# Patient Record
Sex: Male | Born: 1995
Health system: Southern US, Community
[De-identification: ages and names within clinical notes are randomized; demographics above are authoritative.]

## PROBLEM LIST (undated history)

## (undated) ENCOUNTER — Emergency Department (HOSPITAL_BASED_OUTPATIENT_CLINIC_OR_DEPARTMENT_OTHER): Admission: EM | Payer: BLUE CROSS/BLUE SHIELD

## (undated) DIAGNOSIS — I10 Essential (primary) hypertension: Secondary | ICD-10-CM

## (undated) DIAGNOSIS — K3184 Gastroparesis: Secondary | ICD-10-CM

## (undated) DIAGNOSIS — E119 Type 2 diabetes mellitus without complications: Secondary | ICD-10-CM

## (undated) DIAGNOSIS — E11319 Type 2 diabetes mellitus with unspecified diabetic retinopathy without macular edema: Secondary | ICD-10-CM

## (undated) HISTORY — PX: ESOPHAGOGASTRODUODENOSCOPY: SHX1529

## (undated) HISTORY — PX: EYE SURGERY: SHX253

---
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
## 2018-01-29 ENCOUNTER — Other Ambulatory Visit: Payer: Self-pay

## 2018-01-29 ENCOUNTER — Encounter (HOSPITAL_BASED_OUTPATIENT_CLINIC_OR_DEPARTMENT_OTHER): Payer: Self-pay | Admitting: Emergency Medicine

## 2018-01-29 ENCOUNTER — Emergency Department (HOSPITAL_BASED_OUTPATIENT_CLINIC_OR_DEPARTMENT_OTHER)
Admission: EM | Admit: 2018-01-29 | Discharge: 2018-01-29 | Disposition: A | Discharge status: Home / Self Care | Payer: BLUE CROSS/BLUE SHIELD | Attending: Emergency Medicine | Admitting: Emergency Medicine

## 2018-01-29 DIAGNOSIS — Z794 Long term (current) use of insulin: Secondary | ICD-10-CM | POA: Diagnosis not present

## 2018-01-29 DIAGNOSIS — E119 Type 2 diabetes mellitus without complications: Secondary | ICD-10-CM | POA: Diagnosis not present

## 2018-01-29 DIAGNOSIS — J029 Acute pharyngitis, unspecified: Secondary | ICD-10-CM | POA: Diagnosis present

## 2018-01-29 DIAGNOSIS — F17200 Nicotine dependence, unspecified, uncomplicated: Secondary | ICD-10-CM | POA: Diagnosis not present

## 2018-01-29 DIAGNOSIS — Z79899 Other long term (current) drug therapy: Secondary | ICD-10-CM | POA: Insufficient documentation

## 2018-01-29 HISTORY — DX: Type 2 diabetes mellitus without complications: E11.9

## 2018-01-29 LAB — RAPID STREP SCREEN (MED CTR MEBANE ONLY): Streptococcus, Group A Screen (Direct): NEGATIVE

## 2018-01-29 MED ORDER — SALINE SPRAY 0.65 % NA SOLN: 1.0000 | NASAL | 0 refills | Status: DC | PRN

## 2018-01-29 MED ORDER — IBUPROFEN 800 MG PO TABS
800.0000 mg | ORAL_TABLET | Freq: Once | ORAL | Status: AC
  Administered 2018-01-29: 800 mg via ORAL
  Filled 2018-01-29: qty 1

## 2018-01-29 MED ORDER — IBUPROFEN 600 MG PO TABS: 600.0000 mg | ORAL_TABLET | Freq: Four times a day (QID) | ORAL | 0 refills | Status: DC | PRN

## 2018-01-29 MED ORDER — FLUTICASONE PROPIONATE 50 MCG/ACT NA SUSP: 2.0000 | Freq: Every day | NASAL | 2 refills | Status: DC

## 2018-01-29 NOTE — ED Triage Notes (Signed)
Pt c/o sore throat x 1 week

## 2018-01-29 NOTE — Discharge Instructions (Signed)
He was seen today for sore throat.  Your strep screen is negative.  This could be viral or related to postnasal drip.  Use Flonase or nasal saline.  Take ibuprofen as needed.  If you develop any new or worsening symptoms you should be reevaluated.

## 2018-01-29 NOTE — ED Provider Notes (Signed)
[facility] EMERGENCY DEPARTMENT Provider Note   CSN: [id number] Arrival date & time: 01/29/18  0103     History   Chief Complaint Chief Complaint  Patient presents with  . Sore Throat    HPI Anthony Graham is a 22 y.o. male.  HPI  This is a 22 year old male with a history of diabetes who presents with sore throat.  Patient reports 5-day history of worsening sore throat.  He reports pain with swallowing.  Denies fevers, upper respiratory symptoms.  He states "it feels like mucus."  Denies cough.  Currently rates pain 8 out of 10.  He has not taken anything for pain.  He did try cough drops that did not improve his symptoms.  Reports blood sugars have been "good."  Past Medical History:  Diagnosis Date  . Diabetes mellitus without complication (HCC)     There are no active problems to display for this patient.   History reviewed. No pertinent surgical history.     Home Medications    Prior to Admission medications   Medication Sig Start Date End Date Taking? Authorizing Provider  insulin aspart (NOVOLOG) 100 UNIT/ML injection Inject into the skin 3 (three) times daily before meals.   Yes [provider]  fluticasone (FLONASE) 50 MCG/ACT nasal spray Place 2 sprays into both nostrils daily. 01/29/18   Joeangel Jeanpaul, Mayer Maskerourtney F, MD  ibuprofen (ADVIL,MOTRIN) 600 MG tablet Take 1 tablet (600 mg total) by mouth every 6 (six) hours as needed. 01/29/18   Lanny Lipkin, Mayer Maskerourtney F, MD  sodium chloride (OCEAN) 0.65 % SOLN nasal spray Place 1 spray into both nostrils as needed for congestion. 01/29/18   Roland Prine, Mayer Maskerourtney F, MD    Family History No family history on file.  Social History Social History   Tobacco Use  . Smoking status: Current Every Day Smoker  . Smokeless tobacco: Never Used  Substance Use Topics  . Alcohol use: Yes    Frequency: Never    Comment: ocassional  . Drug use: No     Allergies   Patient has no known allergies.   Review of  Systems Review of Systems  Constitutional: Negative for fever.  HENT: Positive for sore throat. Negative for congestion and trouble swallowing.   Respiratory: Negative for cough.   Gastrointestinal: Negative for nausea and vomiting.  All other systems reviewed and are negative.    Physical Exam Updated Vital Signs BP 123/78 (BP Location: Left Arm)   Pulse (!) 108   Temp 98.5 F (36.9 C) (Oral)   Resp 16   Ht 5\' 6"  (1.676 m)   Wt 59.9 kg (132 lb)   SpO2 100%   BMI 21.31 kg/m   Physical Exam  Constitutional: He is oriented to person, place, and time. He appears well-developed and well-nourished. He does not appear ill.  HENT:  Head: Normocephalic and atraumatic.  Mouth/Throat: Oropharynx is clear and moist.  Oropharynx moist and clear, mild erythema, uvula midline, slight symmetric bilateral tonsillar enlargement without exudate, postnasal drip noted  Neck: Neck supple.  Cardiovascular: Normal rate, regular rhythm and normal heart sounds.  No murmur heard. Pulmonary/Chest: Effort normal and breath sounds normal. No respiratory distress. He has no wheezes.  Musculoskeletal: He exhibits no edema.  Lymphadenopathy:    He has no cervical adenopathy.  Neurological: He is alert and oriented to person, place, and time.  Skin: Skin is warm and dry.  Psychiatric: He has a normal mood and affect.  Nursing note and vitals reviewed.  ED Treatments / Results  Labs (all labs ordered are listed, but only abnormal results are displayed) Labs Reviewed  RAPID STREP SCREEN (NOT AT [facility])  CULTURE, GROUP A STREP [facility])    EKG  EKG Interpretation None       Radiology No results found.  Procedures Procedures (including critical care time)  Medications Ordered in ED Medications  ibuprofen (ADVIL,MOTRIN) tablet 800 mg (800 mg Oral Given 01/29/18 0228)     Initial Impression / Assessment and Plan / ED Course  I have reviewed the triage vital signs and the nursing  notes.  Pertinent labs & imaging results that were available during my care of the patient were reviewed by me and considered in my medical decision making (see chart for details).    She presents with sore throat.  He is nontoxic appearing on exam.  Vital signs are notable for mild tachycardia at 108.  He is otherwise nontoxic appearing afebrile.  Exam is benign.  No evidence of tonsillar exudate suggestive of strep pharyngitis or tonsillitis.  No asymmetric swelling suggestive of deep space infection or abscess.  He does have some postnasal drip.  Strep screen is negative.  Recommend Flonase and nasal saline as well as Tylenol or ibuprofen for pain.  After history, exam, and medical workup I feel the patient has been appropriately medically screened and is safe for discharge home. Pertinent diagnoses were discussed with the patient. Patient was given return precautions.   Final Clinical Impressions(s) / ED Diagnoses   Final diagnoses:  Sore throat    ED Discharge Orders        Ordered    fluticasone (FLONASE) 50 MCG/ACT nasal spray  Daily     01/29/18 0237    sodium chloride (OCEAN) 0.65 % SOLN nasal spray  As needed     01/29/18 0237    ibuprofen (ADVIL,MOTRIN) 600 MG tablet  Every 6 hours PRN     01/29/18 0238       Shon Baton, MD 01/29/18 [phone]

## 2018-01-31 LAB — CULTURE, GROUP A STREP (THRC)

## 2018-06-05 DIAGNOSIS — M79662 Pain in left lower leg: Secondary | ICD-10-CM | POA: Insufficient documentation

## 2018-06-05 DIAGNOSIS — F17210 Nicotine dependence, cigarettes, uncomplicated: Secondary | ICD-10-CM | POA: Diagnosis not present

## 2018-06-05 DIAGNOSIS — Z79899 Other long term (current) drug therapy: Secondary | ICD-10-CM | POA: Diagnosis not present

## 2018-06-05 DIAGNOSIS — E119 Type 2 diabetes mellitus without complications: Secondary | ICD-10-CM | POA: Diagnosis not present

## 2018-06-06 ENCOUNTER — Other Ambulatory Visit: Payer: Self-pay

## 2018-06-06 ENCOUNTER — Encounter (HOSPITAL_BASED_OUTPATIENT_CLINIC_OR_DEPARTMENT_OTHER): Payer: Self-pay

## 2018-06-06 ENCOUNTER — Emergency Department (HOSPITAL_BASED_OUTPATIENT_CLINIC_OR_DEPARTMENT_OTHER)
Admission: EM | Admit: 2018-06-06 | Discharge: 2018-06-06 | Disposition: A | Discharge status: Home / Self Care | Payer: BLUE CROSS/BLUE SHIELD | Attending: Emergency Medicine | Admitting: Emergency Medicine

## 2018-06-06 DIAGNOSIS — M79662 Pain in left lower leg: Secondary | ICD-10-CM

## 2018-06-06 LAB — CBG MONITORING, ED: GLUCOSE-CAPILLARY: 227 mg/dL — AB (ref 70–99)

## 2018-06-06 NOTE — Discharge Instructions (Addendum)
Continue to take good care of your diabetes to prevent the complications of diabetes to occur in future, such as neuropathic pain.

## 2018-06-06 NOTE — ED Triage Notes (Addendum)
Pt c/o blood glucose of 400 @ home around 7pm and c/o LLE pain/cramp. Pt took Novolog 15units. Pt went to sleep and awoke deciding to come to ER.  Pt denies leg pain at this time.

## 2018-06-06 NOTE — ED Provider Notes (Signed)
[facility] EMERGENCY DEPARTMENT Provider Note   CSN: [id number] Arrival date & time: 06/05/18  2357     History   Chief Complaint Chief Complaint  Patient presents with  . Leg Pain  . Hyperglycemia    HPI Anthony Graham is a 22 y.o. male.  HPI  [age] year old male with type 1 diabetes comes in with chief complaint of leg pain. Patient states that he intermittently gets leg pain in his left lower extremity.  The pain will last for 5 to 10 minutes and resolve on its own.  When present, pain is severe, and sharp.  He has been told that his pain could be because of neuropathy.  Patient decided to come to the ER today because he had 2 episodes of leg pain, which lasted longer than usual and were more intense.  Patient is currently pain-free. Pt denies nausea, emesis, fevers, trauma.   Past Medical History:  Diagnosis Date  . Diabetes mellitus without complication (HCC)     There are no active problems to display for this patient.   History reviewed. No pertinent surgical history.      Home Medications    Prior to Admission medications   Medication Sig Start Date End Date Taking? Authorizing Provider  fluticasone (FLONASE) 50 MCG/ACT nasal spray Place 2 sprays into both nostrils daily. 01/29/18   Horton, Mayer Maskerourtney F, MD  ibuprofen (ADVIL,MOTRIN) 600 MG tablet Take 1 tablet (600 mg total) by mouth every 6 (six) hours as needed. 01/29/18   Horton, Mayer Maskerourtney F, MD  insulin aspart (NOVOLOG) 100 UNIT/ML injection Inject into the skin 3 (three) times daily before meals.    [provider]  sodium chloride (OCEAN) 0.65 % SOLN nasal spray Place 1 spray into both nostrils as needed for congestion. 01/29/18   Horton, Mayer Maskerourtney F, MD    Family History History reviewed. No pertinent family history.  Social History Social History   Tobacco Use  . Smoking status: Current Every Day Smoker  . Smokeless tobacco: Never Used  Substance Use Topics  . Alcohol use: Yes   Frequency: Never    Comment: ocassional  . Drug use: No     Allergies   Patient has no known allergies.   Review of Systems Review of Systems  Constitutional: Negative for activity change.  Gastrointestinal: Negative for nausea and vomiting.  Musculoskeletal: Positive for myalgias.  Skin: Negative for rash and wound.     Physical Exam Updated Vital Signs BP (!) 135/99 (BP Location: Left Arm)   Pulse (!) 110   Temp 98.4 F (36.9 C) (Oral)   Resp 20   SpO2 100%   Physical Exam  Constitutional: He is oriented to person, place, and time. He appears well-developed.  HENT:  Head: Atraumatic.  Neck: Neck supple.  Cardiovascular: Normal rate.  Pulmonary/Chest: Effort normal.  Abdominal: Soft.  Musculoskeletal: He exhibits no edema, tenderness or deformity.  Neurological: He is alert and oriented to person, place, and time.  Skin: Skin is warm. No rash noted.  Nursing note and vitals reviewed.    ED Treatments / Results  Labs (all labs ordered are listed, but only abnormal results are displayed) Labs Reviewed  CBG MONITORING, ED - Abnormal; Notable for the following components:      Result Value   Glucose-Capillary 227 (*)    All other components within normal limits    EKG None  Radiology No results found.  Procedures Procedures (including critical care time)  Medications Ordered in ED  Medications - No data to display   Initial Impression / Assessment and Plan / ED Course  I have reviewed the triage vital signs and the nursing notes.  Pertinent labs & imaging results that were available during my care of the patient were reviewed by me and considered in my medical decision making (see chart for details).    22 year old comes in with chief complaint of leg pain and elevated blood sugar. His pain is currently resolved.  It seems like he has intermittent episodes of these ghost pains, which his diabetes coordinator is suspecting could be neuropathy.   Either way, he has no evidence of infection, there is no trauma and patient is pain-free. Stable for d/c.  Final Clinical Impressions(s) / ED Diagnoses   Final diagnoses:  Pain in left lower leg    ED Discharge Orders    None       Derwood Kaplan, MD 06/06/18 [phone]

## 2018-12-26 ENCOUNTER — Other Ambulatory Visit: Payer: Self-pay

## 2018-12-26 ENCOUNTER — Encounter (HOSPITAL_BASED_OUTPATIENT_CLINIC_OR_DEPARTMENT_OTHER): Payer: Self-pay | Admitting: Emergency Medicine

## 2018-12-26 ENCOUNTER — Emergency Department (HOSPITAL_BASED_OUTPATIENT_CLINIC_OR_DEPARTMENT_OTHER)
Admission: EM | Admit: 2018-12-26 | Discharge: 2018-12-26 | Disposition: A | Discharge status: Home / Self Care | Payer: BLUE CROSS/BLUE SHIELD | Attending: Emergency Medicine | Admitting: Emergency Medicine

## 2018-12-26 DIAGNOSIS — R740 Nonspecific elevation of levels of transaminase and lactic acid dehydrogenase [LDH]: Secondary | ICD-10-CM | POA: Insufficient documentation

## 2018-12-26 DIAGNOSIS — F17200 Nicotine dependence, unspecified, uncomplicated: Secondary | ICD-10-CM | POA: Insufficient documentation

## 2018-12-26 DIAGNOSIS — Z794 Long term (current) use of insulin: Secondary | ICD-10-CM | POA: Insufficient documentation

## 2018-12-26 DIAGNOSIS — E1065 Type 1 diabetes mellitus with hyperglycemia: Secondary | ICD-10-CM | POA: Insufficient documentation

## 2018-12-26 DIAGNOSIS — R609 Edema, unspecified: Secondary | ICD-10-CM

## 2018-12-26 DIAGNOSIS — R7401 Elevation of levels of liver transaminase levels: Secondary | ICD-10-CM

## 2018-12-26 DIAGNOSIS — Z79899 Other long term (current) drug therapy: Secondary | ICD-10-CM | POA: Insufficient documentation

## 2018-12-26 DIAGNOSIS — R600 Localized edema: Secondary | ICD-10-CM | POA: Insufficient documentation

## 2018-12-26 LAB — BRAIN NATRIURETIC PEPTIDE: B Natriuretic Peptide: 26.8 pg/mL (ref 0.0–100.0)

## 2018-12-26 LAB — COMPREHENSIVE METABOLIC PANEL
ALBUMIN: 3.9 g/dL (ref 3.5–5.0)
ALK PHOS: 129 U/L — AB (ref 38–126)
ALT: 119 U/L — AB (ref 0–44)
AST: 325 U/L — AB (ref 15–41)
Anion gap: 9 (ref 5–15)
BUN: 18 mg/dL (ref 6–20)
CALCIUM: 8.8 mg/dL — AB (ref 8.9–10.3)
CO2: 24 mmol/L (ref 22–32)
CREATININE: 0.8 mg/dL (ref 0.61–1.24)
Chloride: 95 mmol/L — ABNORMAL LOW (ref 98–111)
GFR calc non Af Amer: >60 mL/min (ref >60)
GLUCOSE: 741 mg/dL — AB (ref 70–99)
Potassium: 4.2 mmol/L (ref 3.5–5.1)
SODIUM: 128 mmol/L — AB (ref 135–145)
Total Bilirubin: 0.8 mg/dL (ref 0.3–1.2)
Total Protein: 7.4 g/dL (ref 6.5–8.1)

## 2018-12-26 LAB — URINALYSIS, ROUTINE W REFLEX MICROSCOPIC
BILIRUBIN URINE: NEGATIVE
HGB URINE DIPSTICK: NEGATIVE
Ketones, ur: NEGATIVE mg/dL
Leukocytes, UA: NEGATIVE
Nitrite: NEGATIVE
PH: 7 (ref 5.0–8.0)
Protein, ur: NEGATIVE mg/dL
Specific Gravity, Urine: <1.005 — ABNORMAL LOW (ref 1.005–1.030)

## 2018-12-26 LAB — CBC WITH DIFFERENTIAL/PLATELET
ABS IMMATURE GRANULOCYTES: 0.02 10*3/uL (ref 0.00–0.07)
Basophils Absolute: 0 10*3/uL (ref 0.0–0.1)
Basophils Relative: 0 %
Eosinophils Absolute: 0.1 10*3/uL (ref 0.0–0.5)
Eosinophils Relative: 1 %
HCT: 45.9 % (ref 39.0–52.0)
HEMOGLOBIN: 14 g/dL (ref 13.0–17.0)
IMMATURE GRANULOCYTES: 0 %
LYMPHS PCT: 29 %
Lymphs Abs: 1.7 10*3/uL (ref 0.7–4.0)
MCH: 25.5 pg — ABNORMAL LOW (ref 26.0–34.0)
MCHC: 30.5 g/dL (ref 30.0–36.0)
MCV: 83.5 fL (ref 80.0–100.0)
MONO ABS: 0.6 10*3/uL (ref 0.1–1.0)
Monocytes Relative: 10 %
NEUTROS ABS: 3.5 10*3/uL (ref 1.7–7.7)
NEUTROS PCT: 60 %
Platelets: 222 10*3/uL (ref 150–400)
RBC: 5.5 MIL/uL (ref 4.22–5.81)
RDW: 13.7 % (ref 11.5–15.5)
WBC: 5.8 10*3/uL (ref 4.0–10.5)
nRBC: 0 % (ref 0.0–0.2)

## 2018-12-26 LAB — URINALYSIS, MICROSCOPIC (REFLEX)
BACTERIA UA: NONE SEEN
SQUAMOUS EPITHELIAL / LPF: NONE SEEN (ref 0–5)
WBC, UA: NONE SEEN WBC/hpf (ref 0–5)

## 2018-12-26 LAB — CBG MONITORING, ED
GLUCOSE-CAPILLARY: 514 mg/dL — AB (ref 70–99)
GLUCOSE-CAPILLARY: 377 mg/dL — AB (ref 70–99)
GLUCOSE-CAPILLARY: 284 mg/dL — AB (ref 70–99)

## 2018-12-26 LAB — D-DIMER, QUANTITATIVE: D-Dimer, Quant: 0.37 ug/mL-FEU (ref 0.00–0.50)

## 2018-12-26 MED ORDER — SODIUM CHLORIDE 0.9 % IV SOLN
Freq: Once | INTRAVENOUS | Status: AC
  Administered 2018-12-26: 50 mL/h via INTRAVENOUS

## 2018-12-26 MED ORDER — INSULIN REGULAR(HUMAN) IN NACL 100-0.9 UT/100ML-% IV SOLN
INTRAVENOUS | Status: AC
  Administered 2018-12-26: 5.4 [IU]/h via INTRAVENOUS
  Filled 2018-12-26: qty 100

## 2018-12-26 MED ORDER — SODIUM CHLORIDE 0.9 % IV BOLUS
1000.0000 mL | Freq: Once | INTRAVENOUS | Status: AC
  Administered 2018-12-26: 1000 mL via INTRAVENOUS

## 2018-12-26 MED ORDER — INSULIN REGULAR(HUMAN) IN NACL 100-0.9 UT/100ML-% IV SOLN
INTRAVENOUS | Status: DC
  Administered 2018-12-26: 5.4 [IU]/h via INTRAVENOUS

## 2018-12-26 NOTE — ED Notes (Signed)
ED Provider at bedside. 

## 2018-12-26 NOTE — ED Provider Notes (Signed)
[facility] Provider Note: Lowella DellJ. Lane Kodah Maret, MD, FACEP  CSN: [id number] MRN: [id number] ARRIVAL: 12/26/18 at 0251 ROOM: [facility]10/[facility]10   CHIEF COMPLAINT  Foot Swelling   HISTORY OF PRESENT ILLNESS  12/26/18 3:02 AM Anthony Graham is a 23 y.o. male with type 1 diabetes.  He is here with 2 days of edema in his lower legs and feet.  The edema is pitting in nature.  It is mild to moderate in severity.  He has had no associated pain apart from his chronic diabetic neuropathic pain.  He denies recent travel.  He denies trauma.  He does stand at work but has not changed his patterns recently.  He denies chest pain or shortness of breath.  He states he has tried to be compliant with his insulin.   Past Medical History:  Diagnosis Date  . Diabetes mellitus without complication (HCC)     History reviewed. No pertinent surgical history.  History reviewed. No pertinent family history.  Social History   Tobacco Use  . Smoking status: Current Every Day Smoker  . Smokeless tobacco: Never Used  Substance Use Topics  . Alcohol use: Yes    Frequency: Never    Comment: ocassional  . Drug use: No    Prior to Admission medications   Medication Sig Start Date End Date Taking? Authorizing Provider  insulin aspart (NOVOLOG FLEXPEN) 100 UNIT/ML FlexPen Use as directed up to 90 units daily. Please make appt at [phone] for more refills. 01/30/17  Yes [provider]  insulin degludec (TRESIBA) 100 UNIT/ML SOPN FlexTouch Pen Inject into the skin. 02/25/18  Yes [provider]  fluticasone (FLONASE) 50 MCG/ACT nasal spray Place 2 sprays into both nostrils daily. 01/29/18   Horton, Mayer Maskerourtney F, MD  ibuprofen (ADVIL,MOTRIN) 600 MG tablet Take 1 tablet (600 mg total) by mouth every 6 (six) hours as needed. 01/29/18   Horton, Mayer Maskerourtney F, MD  insulin aspart (NOVOLOG) 100 UNIT/ML injection Inject into the skin 3 (three) times daily before meals.    [provider]  sodium  chloride (OCEAN) 0.65 % SOLN nasal spray Place 1 spray into both nostrils as needed for congestion. 01/29/18   Horton, Mayer Maskerourtney F, MD    Allergies Patient has no known allergies.   REVIEW OF SYSTEMS  Negative except as noted here or in the History of Present Illness.   PHYSICAL EXAMINATION  Initial Vital Signs Blood pressure (!) 144/108, pulse 99, temperature 98.2 F (36.8 C), resp. rate 16, SpO2 100 %.  Examination General: Well-developed, well-nourished male in no acute distress; appearance consistent with age of record HENT: normocephalic; atraumatic; breath nonketotic Eyes: pupils equal, round and reactive to light; extraocular muscles intact Neck: supple Heart: regular rate and rhythm Lungs: clear to auscultation bilaterally; no Kussmaul respirations Abdomen: soft; nondistended; nontender; bowel sounds present Extremities: No deformity; full range of motion; +1 pitting edema of lower legs and feet:   Neurologic: Awake, alert and oriented; motor function intact in all extremities and symmetric; no facial droop Skin: Warm and dry Psychiatric: Normal mood and affect   RESULTS  Summary of this visit's results, reviewed by myself:   EKG Interpretation  Date/Time:    Ventricular Rate:    PR Interval:    QRS Duration:   QT Interval:    QTC Calculation:   R Axis:     Text Interpretation:        Laboratory Studies: Results for orders placed or performed during the hospital encounter  of 12/26/18 (from the past 24 hour(s))  CBC with Differential/Platelet     Status: Abnormal   Collection Time: 12/26/18  3:15 AM  Result Value Ref Range   WBC 5.8 4.0 - 10.5 K/uL   RBC 5.50 4.22 - 5.81 MIL/uL   Hemoglobin 14.0 13.0 - 17.0 g/dL   HCT 42.545.9 95.639.0 - 38.752.0 %   MCV 83.5 80.0 - 100.0 fL   MCH 25.5 (L) 26.0 - 34.0 pg   MCHC 30.5 30.0 - 36.0 g/dL   RDW 56.413.7 33.211.5 - 95.115.5 %   Platelets 222 150 - 400 K/uL   nRBC 0.0 0.0 - 0.2 %   Neutrophils Relative % 60 %   Neutro Abs 3.5 1.7  - 7.7 K/uL   Lymphocytes Relative 29 %   Lymphs Abs 1.7 0.7 - 4.0 K/uL   Monocytes Relative 10 %   Monocytes Absolute 0.6 0.1 - 1.0 K/uL   Eosinophils Relative 1 %   Eosinophils Absolute 0.1 0.0 - 0.5 K/uL   Basophils Relative 0 %   Basophils Absolute 0.0 0.0 - 0.1 K/uL   Immature Granulocytes 0 %   Abs Immature Granulocytes 0.02 0.00 - 0.07 K/uL  Comprehensive metabolic panel     Status: Abnormal   Collection Time: 12/26/18  3:15 AM  Result Value Ref Range   Sodium 128 (L) 135 - 145 mmol/L   Potassium 4.2 3.5 - 5.1 mmol/L   Chloride 95 (L) 98 - 111 mmol/L   CO2 24 22 - 32 mmol/L   Glucose, Bld 741 (HH) 70 - 99 mg/dL   BUN 18 6 - 20 mg/dL   Creatinine, Ser 8.840.80 0.61 - 1.24 mg/dL   Calcium 8.8 (L) 8.9 - 10.3 mg/dL   Total Protein 7.4 6.5 - 8.1 g/dL   Albumin 3.9 3.5 - 5.0 g/dL   AST 166325 (H) 15 - 41 U/L   ALT 119 (H) 0 - 44 U/L   Alkaline Phosphatase 129 (H) 38 - 126 U/L   Total Bilirubin 0.8 0.3 - 1.2 mg/dL   GFR calc non Af Amer >60 >60 mL/min   GFR calc Af Amer >60 >60 mL/min   Anion gap 9 5 - 15  D-dimer, quantitative (not at [facility])     Status: None   Collection Time: 12/26/18  3:15 AM  Result Value Ref Range   D-Dimer, Quant 0.37 0.00 - 0.50 ug/mL-FEU  Urinalysis, Routine w reflex microscopic     Status: Abnormal   Collection Time: 12/26/18  3:15 AM  Result Value Ref Range   Color, Urine COLORLESS (A) YELLOW   APPearance CLEAR CLEAR   Specific Gravity, Urine <1.005 (L) 1.005 - 1.030   pH 7.0 5.0 - 8.0   Glucose, UA >=500 (A) NEGATIVE mg/dL   Hgb urine dipstick NEGATIVE NEGATIVE   Bilirubin Urine NEGATIVE NEGATIVE   Ketones, ur NEGATIVE NEGATIVE mg/dL   Protein, ur NEGATIVE NEGATIVE mg/dL   Nitrite NEGATIVE NEGATIVE   Leukocytes, UA NEGATIVE NEGATIVE  Brain natriuretic peptide     Status: None   Collection Time: 12/26/18  3:15 AM  Result Value Ref Range   B Natriuretic Peptide 26.8 0.0 - 100.0 pg/mL  Urinalysis, Microscopic (reflex)     Status: None   Collection  Time: 12/26/18  3:15 AM  Result Value Ref Range   RBC / HPF 0-5 0 - 5 RBC/hpf   WBC, UA NONE SEEN 0 - 5 WBC/hpf   Bacteria, UA NONE SEEN NONE SEEN   Squamous  Epithelial / LPF NONE SEEN 0 - 5  CBG monitoring, ED     Status: Abnormal   Collection Time: 12/26/18  5:09 AM  Result Value Ref Range   Glucose-Capillary 514 (HH) 70 - 99 mg/dL   Comment 1 Notify RN   CBG monitoring, ED     Status: Abnormal   Collection Time: 12/26/18  6:13 AM  Result Value Ref Range   Glucose-Capillary 377 (H) 70 - 99 mg/dL  CBG monitoring, ED     Status: Abnormal   Collection Time: 12/26/18  7:05 AM  Result Value Ref Range   Glucose-Capillary 284 (H) 70 - 99 mg/dL   Comment 1 Notify RN    Comment 2 Document in Chart    Imaging Studies: No results found.  ED COURSE and MDM  Nursing notes and initial vitals signs, including pulse oximetry, reviewed.  Vitals:   12/26/18 0259 12/26/18 0309 12/26/18 0426 12/26/18 0628  BP: (!) 144/108   106/72  Pulse: 99   91  Resp: 16   16  Temp: 98.2 F (36.8 C)     SpO2: 100%   100%  Weight:   59 kg   Height:  5\' 6"  (1.676 m)     3:51 AM For fluid bolus and IV insulin per Glucose Stabilizer ordered.  5:52 AM Sugar down to 514.  6:16 AM Sugar continues to trend down, now 377.  7:01 AM Sugar down to 284.  Patient encouraged to be compliant with his insulin regimen.  The cause for his peripheral edema is unclear.  He has a negative d-dimer, no evidence of renal insufficiency or nephrotic syndrome and no hypoproteinemia.  He was also advised of his mildly elevated transaminases which she should also discuss with his primary care physician.   PROCEDURES    ED DIAGNOSES     ICD-10-CM   1. Hyperglycemia due to type 1 diabetes mellitus (HCC) E10.65   2. Peripheral edema R60.9   3. Elevated transaminase level R74.0        Blakelee Allington, Jonny Ruiz, MD 12/26/18 [phone]

## 2018-12-26 NOTE — ED Notes (Signed)
Date and time results received: 12/26/18 0348 (use smartphrase ".now" to insert current time)  Test: glucose Critical Value: 741  Name of Provider Notified: Dr. Read Drivers  Orders Received? Or Actions Taken?: fluids and insulin ordered

## 2018-12-26 NOTE — ED Triage Notes (Signed)
Bilateral feet swelling for 2 days. Hx diabetes

## 2019-05-20 ENCOUNTER — Other Ambulatory Visit: Payer: Self-pay

## 2019-05-20 ENCOUNTER — Emergency Department (HOSPITAL_BASED_OUTPATIENT_CLINIC_OR_DEPARTMENT_OTHER)
Admission: EM | Admit: 2019-05-20 | Discharge: 2019-05-20 | Disposition: A | Discharge status: Home / Self Care | Payer: BLUE CROSS/BLUE SHIELD | Attending: Emergency Medicine | Admitting: Emergency Medicine

## 2019-05-20 ENCOUNTER — Encounter (HOSPITAL_BASED_OUTPATIENT_CLINIC_OR_DEPARTMENT_OTHER): Payer: Self-pay

## 2019-05-20 DIAGNOSIS — E119 Type 2 diabetes mellitus without complications: Secondary | ICD-10-CM | POA: Insufficient documentation

## 2019-05-20 DIAGNOSIS — H66001 Acute suppurative otitis media without spontaneous rupture of ear drum, right ear: Secondary | ICD-10-CM | POA: Diagnosis not present

## 2019-05-20 DIAGNOSIS — Z794 Long term (current) use of insulin: Secondary | ICD-10-CM | POA: Diagnosis not present

## 2019-05-20 DIAGNOSIS — K4090 Unilateral inguinal hernia, without obstruction or gangrene, not specified as recurrent: Secondary | ICD-10-CM | POA: Diagnosis not present

## 2019-05-20 DIAGNOSIS — R109 Unspecified abdominal pain: Secondary | ICD-10-CM | POA: Diagnosis present

## 2019-05-20 MED ORDER — AMOXICILLIN 500 MG PO CAPS: 1000.0000 mg | ORAL_CAPSULE | Freq: Two times a day (BID) | ORAL | 0 refills | Status: DC

## 2019-05-20 MED FILL — AMOXICILLIN 500 MG CAPSULE: 500 | 10 days supply | Qty: 40 | Fill #0

## 2019-05-20 NOTE — ED Provider Notes (Signed)
[facility] EMERGENCY DEPARTMENT Provider Note   CSN: [id number] Arrival date & time: 05/20/19  1514    History   Chief Complaint Chief Complaint  Patient presents with  . Abdominal Pain    HPI Anthony Graham is a 23 y.o. male.     HPI  Past Medical History:  Diagnosis Date  . Diabetes mellitus without complication (HCC)     There are no active problems to display for this patient.   History reviewed. No pertinent surgical history.      Home Medications    Prior to Admission medications   Medication Sig Start Date End Date Taking? Authorizing Provider  amoxicillin (AMOXIL) 500 MG capsule Take 2 capsules (1,000 mg total) by mouth 2 (two) times daily. 05/20/19   Melene PlanFloyd, Meeka Cartelli, DO  insulin aspart (NOVOLOG FLEXPEN) 100 UNIT/ML FlexPen Use as directed up to 90 units daily. Please make appt at [phone] for more refills. 01/30/17   [provider]  insulin aspart (NOVOLOG) 100 UNIT/ML injection Inject into the skin 3 (three) times daily before meals.    [provider]  insulin degludec (TRESIBA) 100 UNIT/ML SOPN FlexTouch Pen Inject into the skin. 02/25/18   [provider]    Family History No family history on file.  Social History Social History   Tobacco Use  . Smoking status: Never Smoker  . Smokeless tobacco: Never Used  Substance Use Topics  . Alcohol use: Yes    Frequency: Never    Comment: occ  . Drug use: Yes    Types: Marijuana     Allergies   Patient has no known allergies.   Review of Systems Review of Systems   Physical Exam Updated Vital Signs BP (!) 153/104 (BP Location: Left Arm)   Pulse (!) 110   Temp 98.3 F (36.8 C) (Oral)   Resp 16   Ht 5\' 6"  (1.676 m)   Wt 54 kg   SpO2 99%   BMI 19.21 kg/m   Physical Exam Vitals signs and nursing note reviewed.  Constitutional:      Appearance: He is well-developed.  HENT:     Head: Normocephalic and atraumatic.     Comments: Debris noted in the  left ear canal.  Right TM with normal-appearing posterior aspect, medial aspect is opacified.  No appreciable bulging.  Eyes:     Pupils: Pupils are equal, round, and reactive to light.  Neck:     Musculoskeletal: Normal range of motion and neck supple.     Vascular: No JVD.  Cardiovascular:     Rate and Rhythm: Normal rate and regular rhythm.     Heart sounds: No murmur. No friction rub. No gallop.   Pulmonary:     Effort: No respiratory distress.     Breath sounds: No wheezing.  Abdominal:     General: There is no distension.     Tenderness: There is no guarding or rebound.     Comments: Small palpable defect in the area I would expect a direct inguinal hernia on the left.  No appreciable defect on the right.  No abdominal tenderness.  Circumcised, no penile pain no testicular pain bilaterally no masses palpated in the inguinal canal.  Musculoskeletal: Normal range of motion.  Skin:    Coloration: Skin is not pale.     Findings: No rash.  Neurological:     Mental Status: He is alert and oriented to person, place, and time.  Psychiatric:  Behavior: Behavior normal.      ED Treatments / Results  Labs (all labs ordered are listed, but only abnormal results are displayed) Labs Reviewed - No data to display  EKG None  Radiology No results found.  Procedures Procedures (including critical care time)  Medications Ordered in ED Medications - No data to display   Initial Impression / Assessment and Plan / ED Course  I have reviewed the triage vital signs and the nursing notes.  Pertinent labs & imaging results that were available during my care of the patient were reviewed by me and considered in my medical decision making (see chart for details).        23 yo M with a chief complaint of a left-sided bulge.  This seems to come and go.  History is consistent with a left direct inguinal hernia.  There is a small palpable defect.  No testicular pain no palpable  indirect hernia.  No abdominal tenderness.  Patient also is concerned about right ear pain is been going on for a couple days.  I am able to visualize the posterior aspect of the TM though the anterior is opacified, could be due to infection or wax impaction.  We will have him try dissolving drops start on antibiotics.  The patient initially was tachycardic on arrival though resolved by my exam.  PCP and general surgery follow-up.  4:52 PM:  I have discussed the diagnosis/risks/treatment options with the patient and believe the pt to be eligible for discharge home to follow-up with PCP, gen surgery. We also discussed returning to the ED immediately if new or worsening sx occur. We discussed the sx which are most concerning (e.g., sudden worsening pain, fever, inability to tolerate by mouth) that necessitate immediate return. Medications administered to the patient during their visit and any new prescriptions provided to the patient are listed below.  Medications given during this visit Medications - No data to display   The patient appears reasonably screen and/or stabilized for discharge and I doubt any other medical condition or other [facility] requiring further screening, evaluation, or treatment in the ED at this time prior to discharge.    Final Clinical Impressions(s) / ED Diagnoses   Final diagnoses:  Direct inguinal hernia  Acute suppurative otitis media of right ear without spontaneous rupture of tympanic membrane, recurrence not specified    ED Discharge Orders         Ordered    amoxicillin (AMOXIL) 500 MG capsule  2 times daily     05/20/19 [place], Jaiona Simien, DO 05/20/19 1652

## 2019-05-20 NOTE — Discharge Instructions (Addendum)
There are drops that you can buy at the pharmacy that are used to dissolve earwax.  One such brand is Debrox.  Follow-up with your family doctor for this in case your hearing does not improve after treatment.  Which you likely have to the left side is a direct inguinal hernia.  If this is bothering you then please see a general surgeon so they can assess you and decide if surgery is the right thing for you.  Try to avoid things like heavy lifting or bearing down.  If it does come out and relax and apply gentle pressure.  If you are unable to get it back into place if it starts hurting you or turns red please return to the emergency department for evaluation.

## 2019-05-20 NOTE — ED Triage Notes (Addendum)
Pt c/o abd pain with "a knot" on left side abd, lower back pain, groin pain x 1 week-denies vomiting some diarrhea last night-positive constipation last week when sx started-also c/o right ear ache x 2 days-NAD-steady gait

## 2020-03-02 ENCOUNTER — Encounter (HOSPITAL_BASED_OUTPATIENT_CLINIC_OR_DEPARTMENT_OTHER): Payer: Self-pay | Admitting: Student

## 2020-03-02 ENCOUNTER — Emergency Department (HOSPITAL_BASED_OUTPATIENT_CLINIC_OR_DEPARTMENT_OTHER)
Admission: EM | Admit: 2020-03-02 | Discharge: 2020-03-02 | Disposition: A | Discharge status: Home / Self Care | Payer: BLUE CROSS/BLUE SHIELD | Attending: Emergency Medicine | Admitting: Emergency Medicine

## 2020-03-02 ENCOUNTER — Other Ambulatory Visit: Payer: Self-pay

## 2020-03-02 DIAGNOSIS — R109 Unspecified abdominal pain: Secondary | ICD-10-CM | POA: Diagnosis present

## 2020-03-02 DIAGNOSIS — R112 Nausea with vomiting, unspecified: Secondary | ICD-10-CM | POA: Diagnosis not present

## 2020-03-02 DIAGNOSIS — E109 Type 1 diabetes mellitus without complications: Secondary | ICD-10-CM | POA: Insufficient documentation

## 2020-03-02 LAB — CBC WITH DIFFERENTIAL/PLATELET
Abs Immature Granulocytes: 0.03 10*3/uL (ref 0.00–0.07)
Basophils Absolute: 0 10*3/uL (ref 0.0–0.1)
Basophils Relative: 0 %
Eosinophils Absolute: 0 10*3/uL (ref 0.0–0.5)
Eosinophils Relative: 0 %
HCT: 47.6 % (ref 39.0–52.0)
Hemoglobin: 15.3 g/dL (ref 13.0–17.0)
Immature Granulocytes: 0 %
Lymphocytes Relative: 17 %
Lymphs Abs: 1.5 10*3/uL (ref 0.7–4.0)
MCH: 25.8 pg — ABNORMAL LOW (ref 26.0–34.0)
MCHC: 32.1 g/dL (ref 30.0–36.0)
MCV: 80.4 fL (ref 80.0–100.0)
Monocytes Absolute: 1.1 10*3/uL — ABNORMAL HIGH (ref 0.1–1.0)
Monocytes Relative: 12 %
Neutro Abs: 6.2 10*3/uL (ref 1.7–7.7)
Neutrophils Relative %: 71 %
Platelets: 254 10*3/uL (ref 150–400)
RBC: 5.92 MIL/uL — ABNORMAL HIGH (ref 4.22–5.81)
RDW: 13.4 % (ref 11.5–15.5)
WBC: 8.8 10*3/uL (ref 4.0–10.5)
nRBC: 0 % (ref 0.0–0.2)

## 2020-03-02 LAB — COMPREHENSIVE METABOLIC PANEL
ALT: 26 U/L (ref 0–44)
AST: 41 U/L (ref 15–41)
Albumin: 4.4 g/dL (ref 3.5–5.0)
Alkaline Phosphatase: 98 U/L (ref 38–126)
Anion gap: 12 (ref 5–15)
BUN: 17 mg/dL (ref 6–20)
CO2: 27 mmol/L (ref 22–32)
Calcium: 9.4 mg/dL (ref 8.9–10.3)
Chloride: 98 mmol/L (ref 98–111)
Creatinine, Ser: 1.04 mg/dL (ref 0.61–1.24)
GFR calc Af Amer: >60 mL/min (ref >60)
GFR calc non Af Amer: >60 mL/min (ref >60)
Glucose, Bld: 187 mg/dL — ABNORMAL HIGH (ref 70–99)
Potassium: 3.4 mmol/L — ABNORMAL LOW (ref 3.5–5.1)
Sodium: 137 mmol/L (ref 135–145)
Total Bilirubin: 0.9 mg/dL (ref 0.3–1.2)
Total Protein: 7.8 g/dL (ref 6.5–8.1)

## 2020-03-02 LAB — LIPASE, BLOOD: Lipase: 21 U/L (ref 11–51)

## 2020-03-02 MED ORDER — POTASSIUM CHLORIDE CRYS ER 20 MEQ PO TBCR: 20.0000 meq | EXTENDED_RELEASE_TABLET | Freq: Every day | ORAL | 0 refills | Status: DC

## 2020-03-02 MED ORDER — SODIUM CHLORIDE 0.9 % IV BOLUS
1000.0000 mL | Freq: Once | INTRAVENOUS | Status: AC
  Administered 2020-03-02: 1000 mL via INTRAVENOUS

## 2020-03-02 MED ORDER — FENTANYL CITRATE (PF) 100 MCG/2ML IJ SOLN
50.0000 ug | Freq: Once | INTRAMUSCULAR | Status: DC
  Filled 2020-03-02: qty 2

## 2020-03-02 MED ORDER — DIPHENHYDRAMINE HCL 50 MG/ML IJ SOLN
12.5000 mg | Freq: Once | INTRAMUSCULAR | Status: AC
  Administered 2020-03-02: 13:00:00 12.5 mg via INTRAVENOUS
  Filled 2020-03-02: qty 1

## 2020-03-02 MED ORDER — FAMOTIDINE IN NACL 20-0.9 MG/50ML-% IV SOLN
20.0000 mg | Freq: Once | INTRAVENOUS | Status: AC
  Administered 2020-03-02: 20 mg via INTRAVENOUS
  Filled 2020-03-02: qty 50

## 2020-03-02 MED ORDER — HALOPERIDOL LACTATE 5 MG/ML IJ SOLN
2.0000 mg | Freq: Once | INTRAMUSCULAR | Status: AC
  Administered 2020-03-02: 15:00:00 2 mg via INTRAVENOUS
  Filled 2020-03-02: qty 1

## 2020-03-02 MED ORDER — METOCLOPRAMIDE HCL 5 MG/ML IJ SOLN
10.0000 mg | Freq: Once | INTRAMUSCULAR | Status: AC
  Administered 2020-03-02: 10 mg via INTRAVENOUS
  Filled 2020-03-02: qty 2

## 2020-03-02 MED ORDER — METOCLOPRAMIDE HCL 10 MG PO TABS: 10.0000 mg | ORAL_TABLET | Freq: Three times a day (TID) | ORAL | 0 refills | Status: DC | PRN

## 2020-03-02 MED FILL — POTASSIUM CHLORIDE CRYS ER: 20 | 4 days supply | Qty: 4 | Fill #0

## 2020-03-02 MED FILL — METOCLOPRAMIDE 10 MG TABLET: 10 | 2 days supply | Qty: 6 | Fill #0

## 2020-03-02 NOTE — ED Triage Notes (Signed)
Abdominal pain and vomiting since yesterday afternoon. Pain in his left mid quadrant.  Hx of diabetes.

## 2020-03-02 NOTE — ED Notes (Signed)
IV removed per PA and PT told to get dressed vitals obtained.

## 2020-03-02 NOTE — Discharge Instructions (Signed)
You were seen in the emergency department today for abdominal pain with nausea and vomiting.  Your work-up was overall reassuring.  Your blood sugar was somewhat elevated in the 180s and your potassium was a bit low at 3.4 (normal is 3.5-5.1).  Please continue to monitor your blood sugar at home and take your insulin.  We are sending you home with a short course of a potassium supplement to help with your low potassium and have also provided diet guidelines.  We are sending you home with Reglan to take every 8 hours as needed for nausea and vomiting.  This may also help with discomfort.  We have prescribed you new medication(s) today. Discuss the medications prescribed today with your pharmacist as they can have adverse effects and interactions with your other medicines including over the counter and prescribed medications. Seek medical evaluation if you start to experience new or abnormal symptoms after taking one of these medicines, seek care immediately if you start to experience difficulty breathing, feeling of your throat closing, facial swelling, or rash as these could be indications of a more serious allergic reaction  We suspect your symptoms may be related to gastroparesis which can occur with diabetes, sometimes marijuana use can also trigger problems with nausea and vomiting therefore please try to discontinue THC use as this can contribute as well.    Please follow-up with a GI doctor within the next 3 days.  Return to the emergency department for new or worsening symptoms including but not limited to return or worsen pain, inability to keep fluids down, blood in vomit or stool, elevated blood sugars, increased thirst/urination, fevers, or any other concerns.

## 2020-03-02 NOTE — ED Provider Notes (Addendum)
[facility] EMERGENCY DEPARTMENT Provider Note   CSN: [id number] Arrival date & time: 03/02/20  1224     History Chief Complaint  Patient presents with  . Abdominal Pain  . Emesis    Anthony Graham is a 24 y.o. male with a history of T1DM, neuropathy, and gastroparesis who presents to the ED with complaints of abdominal pain that started yesterday. Patient states pain started gradually, constant since onset, located in the LUQ, currently an 8/10 in severity, worse with supine position, no alleviating factors. Reports associated nausea with TNTC episodes of emesis. Has had a few loose stools. History of somewhat similar last summer, but seemed to go away, he saw an internal medicine physician who referred him to gastroenterology in the fall, still have not seen GI. Patient denies fever, chills, hematemesis, melena, hematochezia, dysuria, frequency, hematuria, testicular pain/swelling, chest pain, or dyspnea. He reports daily THC use, last used 2 days prior & has been using since 2017, no other drug use, no EtOH use. No prior abdominal surgeries. CBG 104 this AM, took his insulin as prescribed.  HPI     Past Medical History:  Diagnosis Date  . Diabetes mellitus without complication (HCC)     There are no problems to display for this patient.   History reviewed. No pertinent surgical history.     History reviewed. No pertinent family history.  Social History   Tobacco Use  . Smoking status: Never Smoker  . Smokeless tobacco: Never Used  Substance Use Topics  . Alcohol use: Yes    Comment: occ  . Drug use: Yes    Types: Marijuana    Comment: daily since 2017    Home Medications Prior to Admission medications   Medication Sig Start Date End Date Taking? Authorizing Provider  amoxicillin (AMOXIL) 500 MG capsule Take 2 capsules (1,000 mg total) by mouth 2 (two) times daily. 05/20/19   Melene Plan, DO  insulin aspart (NOVOLOG FLEXPEN) 100 UNIT/ML FlexPen Use as  directed up to 90 units daily. Please make appt at [phone] for more refills. 01/30/17   [provider]  insulin aspart (NOVOLOG) 100 UNIT/ML injection Inject into the skin 3 (three) times daily before meals.    [provider]  insulin degludec (TRESIBA) 100 UNIT/ML SOPN FlexTouch Pen Inject into the skin. 02/25/18   [provider]    Allergies    Patient has no known allergies.  Review of Systems   Review of Systems  Constitutional: Negative for chills and fever.  Respiratory: Negative for shortness of breath.   Cardiovascular: Negative for chest pain.  Gastrointestinal: Positive for abdominal pain, diarrhea, nausea and vomiting. Negative for anal bleeding, blood in stool and constipation.  Genitourinary: Negative for dysuria, hematuria, scrotal swelling and testicular pain.  Neurological: Negative for syncope.  All other systems reviewed and are negative.   Physical Exam Updated Vital Signs BP (!) 162/111 (BP Location: Right Arm)   Pulse (!) 109   Temp 98.4 F (36.9 C) (Oral)   Resp (!) 24   Ht 5\' 6"  (1.676 m)   Wt 52.2 kg   SpO2 99%   BMI 18.56 kg/m   Physical Exam Vitals and nursing note reviewed.  Constitutional:      Appearance: He is well-developed. He is not toxic-appearing.     Comments: Appears uncomfortable but not in significant acute distress.  HENT:     Head: Normocephalic and atraumatic.  Eyes:     General:  Right eye: No discharge.        Left eye: No discharge.     Conjunctiva/sclera: Conjunctivae normal.  Cardiovascular:     Rate and Rhythm: Regular rhythm. Tachycardia present.  Pulmonary:     Effort: Tachypnea present. No respiratory distress.     Breath sounds: Normal breath sounds. No wheezing, rhonchi or rales.  Abdominal:     General: There is no distension.     Palpations: Abdomen is soft.     Tenderness: There is abdominal tenderness (generalized, more so to the upper abdomen, mild). There is no right  CVA tenderness, left CVA tenderness, guarding or rebound. Negative signs include Murphy's sign and McBurney's sign.  Musculoskeletal:     Cervical back: Neck supple.  Skin:    General: Skin is warm and dry.     Findings: No rash.  Neurological:     Mental Status: He is alert.     Comments: Clear speech.   Psychiatric:        Behavior: Behavior normal.    ED Results / Procedures / Treatments   Labs (all labs ordered are listed, but only abnormal results are displayed) Labs Reviewed - No data to display  EKG None  Radiology No results found.  Procedures Procedures (including critical care time)  Medications Ordered in ED Medications - No data to display  ED Course  I have reviewed the triage vital signs and the nursing notes.  Pertinent labs & imaging results that were available during my care of the patient were reviewed by me and considered in my medical decision making (see chart for details).     Prior results reviewed:   NM Solid Gastric Emptying 10/07/19 IMPRESSION: Delayed gastric emptying study.   Electronically Signed   By: Alcide Clever M.D.   On: 10/07/2019 00:57 MDM Rules/Calculators/A&P                      Patient presents to the ED with complaints of abdominal pain w/ N/V. Has history of similar last summer, was pending GI referral but has not seen specialist at this time. He did have a gastric emptying study that showed delay. He has been diagnosed with gastroparesis. Also reports daily THC use. Patient is nontoxic, he is mildly tachycardic/tachypneic and appears uncomfortable but not in significant distress. Abdomen initially with some mild generalized tenderness, a bit worse in the upper abdomen, no peritoneal signs. As patient states sxs are similar to prior he has had within the past 1 year favor gastroparesis, also considering hyperemesis related to his chronic THC use, additional DDX pancreatitis, cholecystitis, gastritis.   14:20: RE-EVAL:  Patient feeling a bit improved but feels nausea is coming back. Repeat abdominal exam is nontender w/o peritoneal signs. QTc 426, will give 2 mg of Haldol IV.   Labs reviewed and interpreted: CBC: No leukocytosis or anemia. CMP: Mild hypocalcemia.  Mild hyperglycemia without anion gap elevation or acidosis to indicate DKA.  LFTs WNL. Lipase: WNL.  15:35: RE-EVAL: Patient feeling much better, pain and nausea free, will PO challenge.  16:20: RE-EVAL: Patient states his symptoms are completely resolved, he is tolerating p.o., he states he is ready to go home, tachycardia improving, HR 103 when I was in exam room.  Has not provided a urine sample at this time, however has no urinary symptoms and metabolic panel does not raise concern for DKA therefore feel this can be discontinued.  His serial abdominal exams remain nontender without peritoneal  signs, do not suspect acute surgical abdomen, do not suspect perforation, obstruction, appendicitis, cholecystitis, pancreatitis, or diverticulitis.  Will discharge home with Reglan.  Encouraged discontinuation of THC use. GI follow up. I discussed results, treatment plan, need for follow-up, and return precautions with the patient and parent at bedside. Provided opportunity for questions, patient and parent confirmed understanding and are in agreement with plan.    Findings and plan of care discussed with supervising physician Dr. Sedonia Small who is in agreement.    Final Clinical Impression(s) / ED Diagnoses Final diagnoses:  Non-intractable vomiting with nausea, unspecified vomiting type    Rx / DC Orders ED Discharge Orders         Ordered    metoCLOPramide (REGLAN) 10 MG tablet  Every 8 hours PRN     03/02/20 1634    potassium chloride SA (KLOR-CON) 20 MEQ tablet  Daily     03/02/20 [address], PA-C 03/02/20 1650    [address] PA-C 03/02/20 1651    Maudie Flakes, MD 03/03/20 [phone]

## 2020-08-02 ENCOUNTER — Emergency Department (HOSPITAL_BASED_OUTPATIENT_CLINIC_OR_DEPARTMENT_OTHER)
Admission: EM | Admit: 2020-08-02 | Discharge: 2020-08-02 | Disposition: A | Discharge status: Home / Self Care | Payer: BLUE CROSS/BLUE SHIELD | Attending: Emergency Medicine | Admitting: Emergency Medicine

## 2020-08-02 ENCOUNTER — Other Ambulatory Visit: Payer: Self-pay

## 2020-08-02 ENCOUNTER — Encounter (HOSPITAL_BASED_OUTPATIENT_CLINIC_OR_DEPARTMENT_OTHER): Payer: Self-pay | Admitting: *Deleted

## 2020-08-02 DIAGNOSIS — R112 Nausea with vomiting, unspecified: Secondary | ICD-10-CM | POA: Insufficient documentation

## 2020-08-02 DIAGNOSIS — E1065 Type 1 diabetes mellitus with hyperglycemia: Secondary | ICD-10-CM | POA: Diagnosis not present

## 2020-08-02 DIAGNOSIS — R197 Diarrhea, unspecified: Secondary | ICD-10-CM | POA: Diagnosis not present

## 2020-08-02 DIAGNOSIS — R1033 Periumbilical pain: Secondary | ICD-10-CM | POA: Insufficient documentation

## 2020-08-02 DIAGNOSIS — R739 Hyperglycemia, unspecified: Secondary | ICD-10-CM

## 2020-08-02 LAB — COMPREHENSIVE METABOLIC PANEL
ALT: 27 U/L (ref 0–44)
AST: 31 U/L (ref 15–41)
Albumin: 4.5 g/dL (ref 3.5–5.0)
Alkaline Phosphatase: 117 U/L (ref 38–126)
Anion gap: 13 (ref 5–15)
BUN: 15 mg/dL (ref 6–20)
CO2: 26 mmol/L (ref 22–32)
Calcium: 9.4 mg/dL (ref 8.9–10.3)
Chloride: 98 mmol/L (ref 98–111)
Creatinine, Ser: 1.22 mg/dL (ref 0.61–1.24)
GFR calc Af Amer: >60 mL/min (ref >60)
GFR calc non Af Amer: >60 mL/min (ref >60)
Glucose, Bld: 215 mg/dL — ABNORMAL HIGH (ref 70–99)
Potassium: 3.9 mmol/L (ref 3.5–5.1)
Sodium: 137 mmol/L (ref 135–145)
Total Bilirubin: 0.6 mg/dL (ref 0.3–1.2)
Total Protein: 7.8 g/dL (ref 6.5–8.1)

## 2020-08-02 LAB — CBC
HCT: 47 % (ref 39.0–52.0)
Hemoglobin: 15 g/dL (ref 13.0–17.0)
MCH: 25 pg — ABNORMAL LOW (ref 26.0–34.0)
MCHC: 31.9 g/dL (ref 30.0–36.0)
MCV: 78.2 fL — ABNORMAL LOW (ref 80.0–100.0)
Platelets: 238 10*3/uL (ref 150–400)
RBC: 6.01 MIL/uL — ABNORMAL HIGH (ref 4.22–5.81)
RDW: 13.9 % (ref 11.5–15.5)
WBC: 8.7 10*3/uL (ref 4.0–10.5)
nRBC: 0 % (ref 0.0–0.2)

## 2020-08-02 LAB — URINALYSIS, ROUTINE W REFLEX MICROSCOPIC
Bilirubin Urine: NEGATIVE
Glucose, UA: >=500 mg/dL — AB
Ketones, ur: NEGATIVE mg/dL
Leukocytes,Ua: NEGATIVE
Nitrite: NEGATIVE
Protein, ur: >300 mg/dL — AB
Specific Gravity, Urine: 1.025 (ref 1.005–1.030)
pH: 8 (ref 5.0–8.0)

## 2020-08-02 LAB — URINALYSIS, MICROSCOPIC (REFLEX)

## 2020-08-02 LAB — LIPASE, BLOOD: Lipase: 22 U/L (ref 11–51)

## 2020-08-02 MED ORDER — PROMETHAZINE HCL 25 MG PO TABS: 25.0000 mg | ORAL_TABLET | Freq: Four times a day (QID) | ORAL | 0 refills | Status: DC | PRN

## 2020-08-02 MED ORDER — ONDANSETRON 4 MG PO TBDP
4.0000 mg | ORAL_TABLET | Freq: Once | ORAL | Status: AC | PRN
  Administered 2020-08-02: 4 mg via ORAL
  Filled 2020-08-02: qty 1

## 2020-08-02 MED ORDER — HALOPERIDOL LACTATE 5 MG/ML IJ SOLN
5.0000 mg | Freq: Once | INTRAMUSCULAR | Status: AC
  Administered 2020-08-02: 5 mg via INTRAVENOUS
  Filled 2020-08-02: qty 1

## 2020-08-02 MED ORDER — SODIUM CHLORIDE 0.9 % IV BOLUS
1000.0000 mL | Freq: Once | INTRAVENOUS | Status: AC
  Administered 2020-08-02: 1000 mL via INTRAVENOUS

## 2020-08-02 MED FILL — PROMETHAZINE 25 MG TABLET: 25 | 7 days supply | Qty: 30 | Fill #0

## 2020-08-02 NOTE — Discharge Instructions (Addendum)
You were seen in the ER for nausea vomiting abdominal pain diarrhea  Lab shows elevated glucose level but otherwise normal.  Your urine sample appears to have some bacteria but you have no urinary tract infection symptoms. We have sent this to the lab for culture to rule out occult/small infection in the urine.  They will call you if urine culture confirms infection  Stop using marijuana, this can cause nausea vomiting abdominal pain  Stay hydrated  Phenergan 25 mg every 6 hours orally or rectally for nausea  Return for worsening or new symptoms, persistent vomiting, severe constant pain, problems urinating

## 2020-08-02 NOTE — ED Provider Notes (Signed)
[facility] EMERGENCY DEPARTMENT Provider Note   CSN: [id number] Arrival date & time: 08/02/20  1049     History Chief Complaint  Patient presents with  . Emesis  . Abdominal Pain    Anthony Graham is a 24 y.o. male with history of type 1 diabetes, gastroparesis presents to the ED for evaluation of sudden onset sharp, constant, moderate to severe abdominal pain around the bellybutton that radiates up into the upper abdomen.  This began last evening around 6 PM.  Has associated nausea and vomiting.  States he has vomited once every hour.  No coffee-ground emesis or blood in the emesis.  Has had some diarrhea as well without any melena or blood.  Reports associated chills and generalized weakness.  States symptoms feel similar to his previous bouts of gastroparesis.  States he takes gabapentin for this and this has not helped.  Denies sick contacts with similar symptoms.  Denies recent exposure to suspicious foods.  Denies alcohol use but states he smokes marijuana every day.  He last smoked 2 days ago.  Admits that sometimes when he does not smoke marijuana he will get similar symptoms as well.  Denies urinary symptoms like dysuria, frequency, urgency or hematuria.  Denies history of kidney stones.  No abdominal surgeries.  Patient is asking for water.  Noted to be taking sips of water without any emesis.  HPI     Past Medical History:  Diagnosis Date  . Diabetes mellitus without complication (HCC)     There are no problems to display for this patient.   History reviewed. No pertinent surgical history.     No family history on file.  Social History   Tobacco Use  . Smoking status: Never Smoker  . Smokeless tobacco: Never Used  Vaping Use  . Vaping Use: Never used  Substance Use Topics  . Alcohol use: Yes    Comment: occ  . Drug use: Yes    Types: Marijuana    Comment: daily since 2017    Home Medications Prior to Admission medications   Medication Sig  Start Date End Date Taking? Authorizing Provider  amoxicillin (AMOXIL) 500 MG capsule Take 2 capsules (1,000 mg total) by mouth 2 (two) times daily. 05/20/19   Melene Plan, DO  insulin aspart (NOVOLOG FLEXPEN) 100 UNIT/ML FlexPen Use as directed up to 90 units daily. Please make appt at [phone] for more refills. 01/30/17   [provider]  insulin aspart (NOVOLOG) 100 UNIT/ML injection Inject into the skin 3 (three) times daily before meals.    [provider]  insulin degludec (TRESIBA) 100 UNIT/ML SOPN FlexTouch Pen Inject into the skin. 02/25/18   [provider]  metoCLOPramide (REGLAN) 10 MG tablet Take 1 tablet (10 mg total) by mouth every 8 (eight) hours as needed for nausea or vomiting. 03/02/20   Petrucelli, Samantha R, PA-C  potassium chloride SA (KLOR-CON) 20 MEQ tablet Take 1 tablet (20 mEq total) by mouth daily. 03/02/20   Petrucelli, Pleas Koch, PA-C  promethazine (PHENERGAN) 25 MG tablet Take 1 tablet (25 mg total) by mouth every 6 (six) hours as needed for nausea or vomiting. 08/02/20   Liberty Handy, PA-C    Allergies    Patient has no known allergies.  Review of Systems   Review of Systems  Constitutional: Positive for diaphoresis.  Gastrointestinal: Positive for abdominal pain, diarrhea, nausea and vomiting.  Neurological: Positive for weakness.  All other systems reviewed and are negative.  Physical Exam Updated Vital Signs BP 112/75 (BP Location: Right Arm)   Pulse (!) 110   Temp 98.2 F (36.8 C) (Oral)   Resp 18   Ht 5\' 6"  (1.676 m)   Wt 55.8 kg   SpO2 97%   BMI 19.85 kg/m   Physical Exam Vitals and nursing note reviewed.  Constitutional:      Appearance: He is well-developed.     Comments: Non toxic.  HENT:     Head: Normocephalic and atraumatic.     Nose: Nose normal.     Mouth/Throat:     Comments: MMM Eyes:     Conjunctiva/sclera: Conjunctivae normal.  Cardiovascular:     Rate and Rhythm: Normal rate and regular  rhythm.     Heart sounds: Normal heart sounds.  Pulmonary:     Effort: Pulmonary effort is normal.     Breath sounds: Normal breath sounds.  Abdominal:     General: Bowel sounds are normal.     Palpations: Abdomen is soft.     Tenderness: There is abdominal tenderness in the epigastric area and periumbilical area.     Comments: No G/R/R. No suprapubic or CVA tenderness. Negative Murphy's and McBurney's  Musculoskeletal:        General: Normal range of motion.     Cervical back: Normal range of motion.  Skin:    General: Skin is warm and dry.     Capillary Refill: Capillary refill takes less than 2 seconds.  Neurological:     Mental Status: He is alert.  Psychiatric:        Behavior: Behavior normal.     ED Results / Procedures / Treatments   Labs (all labs ordered are listed, but only abnormal results are displayed) Labs Reviewed  COMPREHENSIVE METABOLIC PANEL - Abnormal; Notable for the following components:      Result Value   Glucose, Bld 215 (*)    All other components within normal limits  CBC - Abnormal; Notable for the following components:   RBC 6.01 (*)    MCV 78.2 (*)    MCH 25.0 (*)    All other components within normal limits  URINALYSIS, ROUTINE W REFLEX MICROSCOPIC - Abnormal; Notable for the following components:   Glucose, UA >=500 (*)    Hgb urine dipstick MODERATE (*)    Protein, ur >300 (*)    All other components within normal limits  URINALYSIS, MICROSCOPIC (REFLEX) - Abnormal; Notable for the following components:   Bacteria, UA FEW (*)    All other components within normal limits  URINE CULTURE  LIPASE, BLOOD    EKG None  Radiology No results found.  Procedures Procedures (including critical care time)  Medications Ordered in ED Medications  ondansetron (ZOFRAN-ODT) disintegrating tablet 4 mg (4 mg Oral Given 08/02/20 1121)  sodium chloride 0.9 % bolus 1,000 mL (0 mLs Intravenous Stopped 08/02/20 1551)  haloperidol lactate (HALDOL)  injection 5 mg (5 mg Intravenous Given 08/02/20 1447)    ED Course  I have reviewed the triage vital signs and the nursing notes.  Pertinent labs & imaging results that were available during my care of the patient were reviewed by me and considered in my medical decision making (see chart for details).  Clinical Course as of Aug 02 1734  Mon Aug 02, 2020  1405 Glucose(!): 215 [CG]  1405 Anion gap: 13 [CG]  1405 Squamous Epithelial / LPF: 0-5 [CG]  1405 Bacteria, UA(!): FEW [CG]  1405 WBC, UA:  6-10 [CG]  1406 RBC / HPF: 11-20 [CG]    Clinical Course User Index [CG] Liberty Handy, PA-C   MDM Rules/Calculators/A&P                           This patient complains of nausea, vomiting, abdominal pain, diarrhea.  Reports history of gastroparesis in the past that has felt similarly.  Admits to daily marijuana use last use 2 days ago.  States sometimes when he does not smoke marijuana he has similar symptoms as well.  No red flags like fever, hematemesis, chest pain, bloody stools, urinary symptoms.  No history of kidney stones.  No abdominal surgeries.  No sick contacts.  No exposure to suspicious foods.  Type I diabetic.  I obtained additional history from triage, nursing notes and review of medical chart.  Previous medical records available, nursing notes reviewed to obtain more history and assist with MDM  Chief complain involves an extensive number of treatment options and is a complaint that carries with it a high risk of complications and morbidity and mortality.    The differential diagnosis includes viral gastroenteritis, diverticulitis, gastroparesis, cannabinoid hyperemesis syndrome, appendicitis, cholecystitis, pancreatitis.  Laboratory studies and imaging ordered in triage RN and I ordered additional laboratory and imaging studies including CBC, CMP, lipase, urinalysis.   I have ordered urine culture.  I have personally visualized and interpreted labs and imaging.    ER  work up reveals hyperglycemia glucose 215, otherwise normal electrolytes, anion gap.  No leukocytosis.  Urinalysis reveals few bacteria, 6-10 WBC, 11-20 RBCs, proteinuria and glucosuria.  This is a good sample.  Asked patient regarding specific GU symptoms but he denies dysuria, hematuria, urinary frequency or urgency.  No penile discharge.  No history of kidney stones or UTIs in the past.  Repeat abdominal exam without suprapubic or CVA tenderness. Will send urine for culture but doubt UTI/pyelo/renal stone.   I ordered medications including IV fluid, Haldol.  I have ordered continuous cardiac monitoring, pulse ox.  Will plan for serial re-examinations. Close monitoring.   I have re-evaluated the patient.   Patient reports significant improvement in symptoms.  Reports no longer feeling nauseated or abdominal pain.  He has drank 2 cups of water without any problems here.  Requesting discharge.  Encouraged oral hydration, Phenergan as needed.  Recommended cessation of marijuana use.  Return precautions given.  Consults made in the ED: none   Critical interventions in the ED: None        Final Clinical Impression(s) / ED Diagnoses Final diagnoses:  Nausea vomiting and diarrhea  Hyperglycemia    Rx / DC Orders ED Discharge Orders         Ordered    promethazine (PHENERGAN) 25 MG tablet  Every 6 hours PRN        08/02/20 1709           Liberty Handy, PA-C 08/02/20 1735    Alvira Monday, MD 08/03/20 2333

## 2020-08-02 NOTE — ED Triage Notes (Signed)
Abdominal pain and vomiting since yesterday. Hx of gastroparesis.

## 2020-08-03 LAB — URINE CULTURE: Culture: NO GROWTH

## 2020-08-04 ENCOUNTER — Emergency Department (HOSPITAL_BASED_OUTPATIENT_CLINIC_OR_DEPARTMENT_OTHER)
Admission: EM | Admit: 2020-08-04 | Discharge: 2020-08-04 | Disposition: A | Discharge status: Home / Self Care | Payer: BLUE CROSS/BLUE SHIELD | Attending: Emergency Medicine | Admitting: Emergency Medicine

## 2020-08-04 ENCOUNTER — Encounter (HOSPITAL_BASED_OUTPATIENT_CLINIC_OR_DEPARTMENT_OTHER): Payer: Self-pay | Admitting: *Deleted

## 2020-08-04 ENCOUNTER — Other Ambulatory Visit: Payer: Self-pay

## 2020-08-04 ENCOUNTER — Encounter (HOSPITAL_BASED_OUTPATIENT_CLINIC_OR_DEPARTMENT_OTHER): Payer: Self-pay

## 2020-08-04 ENCOUNTER — Emergency Department (HOSPITAL_BASED_OUTPATIENT_CLINIC_OR_DEPARTMENT_OTHER)
Admission: EM | Admit: 2020-08-04 | Discharge: 2020-08-04 | Discharge status: Absent without leave | Payer: BLUE CROSS/BLUE SHIELD

## 2020-08-04 DIAGNOSIS — E860 Dehydration: Secondary | ICD-10-CM | POA: Insufficient documentation

## 2020-08-04 DIAGNOSIS — K529 Noninfective gastroenteritis and colitis, unspecified: Secondary | ICD-10-CM | POA: Insufficient documentation

## 2020-08-04 DIAGNOSIS — Z794 Long term (current) use of insulin: Secondary | ICD-10-CM | POA: Insufficient documentation

## 2020-08-04 DIAGNOSIS — E119 Type 2 diabetes mellitus without complications: Secondary | ICD-10-CM | POA: Insufficient documentation

## 2020-08-04 LAB — URINALYSIS, ROUTINE W REFLEX MICROSCOPIC
Bilirubin Urine: NEGATIVE
Glucose, UA: >=500 mg/dL — AB
Ketones, ur: NEGATIVE mg/dL
Leukocytes,Ua: NEGATIVE
Nitrite: NEGATIVE
Protein, ur: >300 mg/dL — AB
Specific Gravity, Urine: 1.025 (ref 1.005–1.030)
pH: 6.5 (ref 5.0–8.0)

## 2020-08-04 LAB — URINALYSIS, MICROSCOPIC (REFLEX): Squamous Epithelial / HPF: NONE SEEN (ref 0–5)

## 2020-08-04 LAB — CBG MONITORING, ED: Glucose-Capillary: 200 mg/dL — ABNORMAL HIGH (ref 70–99)

## 2020-08-04 NOTE — ED Notes (Signed)
CBG unable to transfer. 200 in triage.

## 2020-08-04 NOTE — ED Triage Notes (Signed)
States he believes he is dehydrated, unable to keep fluids down. Was seen here 8/23, here for fluids

## 2020-08-04 NOTE — ED Triage Notes (Signed)
Pt reports N/V x several days. Denies fever. Reports CBG 90-110.

## 2020-08-05 ENCOUNTER — Emergency Department (HOSPITAL_BASED_OUTPATIENT_CLINIC_OR_DEPARTMENT_OTHER)
Admission: EM | Admit: 2020-08-05 | Discharge: 2020-08-05 | Disposition: A | Discharge status: Home / Self Care | Payer: BLUE CROSS/BLUE SHIELD | Source: Home / Self Care | Attending: Emergency Medicine | Admitting: Emergency Medicine

## 2020-08-05 DIAGNOSIS — K529 Noninfective gastroenteritis and colitis, unspecified: Secondary | ICD-10-CM

## 2020-08-05 DIAGNOSIS — E860 Dehydration: Secondary | ICD-10-CM

## 2020-08-05 LAB — BASIC METABOLIC PANEL
Anion gap: 13 (ref 5–15)
BUN: 23 mg/dL — ABNORMAL HIGH (ref 6–20)
CO2: 26 mmol/L (ref 22–32)
Calcium: 8.5 mg/dL — ABNORMAL LOW (ref 8.9–10.3)
Chloride: 94 mmol/L — ABNORMAL LOW (ref 98–111)
Creatinine, Ser: 1.04 mg/dL (ref 0.61–1.24)
GFR calc Af Amer: >60 mL/min (ref >60)
GFR calc non Af Amer: >60 mL/min (ref >60)
Glucose, Bld: 212 mg/dL — ABNORMAL HIGH (ref 70–99)
Potassium: 3.5 mmol/L (ref 3.5–5.1)
Sodium: 133 mmol/L — ABNORMAL LOW (ref 135–145)

## 2020-08-05 LAB — CBC WITH DIFFERENTIAL/PLATELET
Abs Immature Granulocytes: 0.04 10*3/uL (ref 0.00–0.07)
Basophils Absolute: 0 10*3/uL (ref 0.0–0.1)
Basophils Relative: 0 %
Eosinophils Absolute: 0 10*3/uL (ref 0.0–0.5)
Eosinophils Relative: 0 %
HCT: 53.4 % — ABNORMAL HIGH (ref 39.0–52.0)
Hemoglobin: 17.2 g/dL — ABNORMAL HIGH (ref 13.0–17.0)
Immature Granulocytes: 0 %
Lymphocytes Relative: 14 %
Lymphs Abs: 1.3 10*3/uL (ref 0.7–4.0)
MCH: 24.8 pg — ABNORMAL LOW (ref 26.0–34.0)
MCHC: 32.2 g/dL (ref 30.0–36.0)
MCV: 77.1 fL — ABNORMAL LOW (ref 80.0–100.0)
Monocytes Absolute: 1.1 10*3/uL — ABNORMAL HIGH (ref 0.1–1.0)
Monocytes Relative: 11 %
Neutro Abs: 7.4 10*3/uL (ref 1.7–7.7)
Neutrophils Relative %: 75 %
Platelets: 227 10*3/uL (ref 150–400)
RBC: 6.93 MIL/uL — ABNORMAL HIGH (ref 4.22–5.81)
RDW: 14.7 % (ref 11.5–15.5)
WBC: 9.9 10*3/uL (ref 4.0–10.5)
nRBC: 0 % (ref 0.0–0.2)

## 2020-08-05 LAB — I-STAT VENOUS BLOOD GAS, ED
Acid-Base Excess: 5 mmol/L — ABNORMAL HIGH (ref 0.0–2.0)
Bicarbonate: 30.3 mmol/L — ABNORMAL HIGH (ref 20.0–28.0)
Calcium, Ion: 1.13 mmol/L — ABNORMAL LOW (ref 1.15–1.40)
HCT: 56 % — ABNORMAL HIGH (ref 39.0–52.0)
Hemoglobin: 19 g/dL — ABNORMAL HIGH (ref 13.0–17.0)
O2 Saturation: 77 %
Patient temperature: 98.5
Potassium: 3.7 mmol/L (ref 3.5–5.1)
Sodium: 133 mmol/L — ABNORMAL LOW (ref 135–145)
TCO2: 32 mmol/L (ref 22–32)
pCO2, Ven: 45.2 mmHg (ref 44.0–60.0)
pH, Ven: 7.434 — ABNORMAL HIGH (ref 7.250–7.430)
pO2, Ven: 41 mmHg (ref 32.0–45.0)

## 2020-08-05 MED ORDER — METOCLOPRAMIDE HCL 5 MG/ML IJ SOLN
10.0000 mg | Freq: Once | INTRAMUSCULAR | Status: AC
  Administered 2020-08-05: 10 mg via INTRAVENOUS
  Filled 2020-08-05: qty 2

## 2020-08-05 MED ORDER — LACTATED RINGERS IV BOLUS
1000.0000 mL | Freq: Once | INTRAVENOUS | Status: AC
  Administered 2020-08-05: 1000 mL via INTRAVENOUS

## 2020-08-05 MED ORDER — SODIUM CHLORIDE 0.9 % IV BOLUS
2000.0000 mL | Freq: Once | INTRAVENOUS | Status: AC
  Administered 2020-08-05: 2000 mL via INTRAVENOUS

## 2020-08-05 MED ORDER — ONDANSETRON HCL 4 MG/2ML IJ SOLN
4.0000 mg | Freq: Once | INTRAMUSCULAR | Status: AC
  Administered 2020-08-05: 4 mg via INTRAVENOUS
  Filled 2020-08-05: qty 2

## 2020-08-05 MED ORDER — ONDANSETRON 8 MG PO TBDP: 8.0000 mg | ORAL_TABLET | Freq: Three times a day (TID) | ORAL | 0 refills | Status: DC | PRN

## 2020-08-05 NOTE — ED Provider Notes (Signed)
[facility] Provider Note: Lowella Dell, MD, FACEP  CSN: [id number] MRN: [id number] ARRIVAL: 08/04/20 at 2346 ROOM: MH07/[facility]07   CHIEF COMPLAINT  Vomiting   HISTORY OF PRESENT ILLNESS  08/05/20 1:10 AM Anthony Graham is a 23 y.o. male is an insulin-dependent diabetic with several days of nausea and vomiting.  He had diarrhea at first but this has abated.  He has not had any significant abdominal cramping with this but does have some mild epigastric pain since vomiting.  His vomiting has been persistent and he has been unable to keep anything down; attempts to eat or drink have exacerbated his vomiting.  He has a dry mouth and he feels generally weak.  He has not had a fever.   Past Medical History:  Diagnosis Date  . Diabetes mellitus without complication (HCC)     History reviewed. No pertinent surgical history.  History reviewed. No pertinent family history.  Social History   Tobacco Use  . Smoking status: Never Smoker  . Smokeless tobacco: Never Used  Vaping Use  . Vaping Use: Never used  Substance Use Topics  . Alcohol use: Yes    Comment: occ  . Drug use: Yes    Types: Marijuana    Comment: daily since 2017    Prior to Admission medications   Medication Sig Start Date End Date Taking? Authorizing Provider  Continuous Blood Gluc Sensor (FREESTYLE LIBRE 2 SENSOR) MISC  07/17/20   [provider]  gabapentin (NEURONTIN) 100 MG capsule Take by mouth. 03/17/20   [provider]  insulin aspart (NOVOLOG FLEXPEN) 100 UNIT/ML FlexPen Use as directed up to 90 units daily. Please make appt at [phone] for more refills. 01/30/17   [provider]  insulin aspart (NOVOLOG) 100 UNIT/ML injection Inject into the skin 3 (three) times daily before meals.    [provider]  insulin degludec (TRESIBA) 100 UNIT/ML SOPN FlexTouch Pen Inject into the skin. 02/25/18   [provider]  lisinopril (ZESTRIL) 10 MG tablet Take 10  mg by mouth daily. 06/25/20   [provider]  metoCLOPramide (REGLAN) 10 MG tablet Take 1 tablet (10 mg total) by mouth every 8 (eight) hours as needed for nausea or vomiting. 03/02/20   Petrucelli, Samantha R, PA-C  ondansetron (ZOFRAN ODT) 8 MG disintegrating tablet Take 1 tablet (8 mg total) by mouth every 8 (eight) hours as needed for nausea or vomiting. 08/05/20   Dani Danis, Jonny Ruiz, MD  promethazine (PHENERGAN) 25 MG tablet Take 1 tablet (25 mg total) by mouth every 6 (six) hours as needed for nausea or vomiting. 08/02/20   Liberty Handy, PA-C  potassium chloride SA (KLOR-CON) 20 MEQ tablet Take 1 tablet (20 mEq total) by mouth daily. 03/02/20 08/05/20  Petrucelli, Pleas Koch, PA-C    Allergies Patient has no known allergies.   REVIEW OF SYSTEMS  Negative except as noted here or in the History of Present Illness.   PHYSICAL EXAMINATION  Initial Vital Signs Blood pressure (!) 160/124, pulse (!) 114, temperature 98.5 F (36.9 C), temperature source Oral, resp. rate 18, height 5\' 6"  (1.676 m), weight 55.8 kg, SpO2 99 %.  Examination General: Well-developed, well-nourished male in no acute distress; appearance consistent with age of record HENT: normocephalic; atraumatic Eyes: pupils equal, round and reactive to light; extraocular muscles intact Neck: supple Heart: regular rate and rhythm; tachycardic Lungs: clear to auscultation bilaterally Abdomen: soft; nondistended; minimal epigastric tenderness; no masses or hepatosplenomegaly; bowel sounds present  Extremities: No deformity; full range of motion; pulses normal Neurologic: Awake, alert and oriented; motor function intact in all extremities and symmetric; no facial droop Skin: Warm and dry Psychiatric: Normal mood and affect   RESULTS  Summary of this visit's results, reviewed and interpreted by myself:   EKG Interpretation  Date/Time:    Ventricular Rate:    PR Interval:    QRS Duration:   QT Interval:    QTC  Calculation:   R Axis:     Text Interpretation:        Laboratory Studies: Results for orders placed or performed during the hospital encounter of 08/05/20 (from the past 24 hour(s))  CBG monitoring, ED     Status: Abnormal   Collection Time: 08/04/20 11:56 PM  Result Value Ref Range   Glucose-Capillary 200 (H) 70 - 99 mg/dL  CBC with Differential/Platelet     Status: Abnormal   Collection Time: 08/05/20 12:39 AM  Result Value Ref Range   WBC 9.9 4.0 - 10.5 K/uL   RBC 6.93 (H) 4.22 - 5.81 MIL/uL   Hemoglobin 17.2 (H) 13.0 - 17.0 g/dL   HCT 94.4 (H) 39 - 52 %   MCV 77.1 (L) 80.0 - 100.0 fL   MCH 24.8 (L) 26.0 - 34.0 pg   MCHC 32.2 30.0 - 36.0 g/dL   RDW 96.7 59.1 - 63.8 %   Platelets 227 150 - 400 K/uL   nRBC 0.0 0.0 - 0.2 %   Neutrophils Relative % 75 %   Neutro Abs 7.4 1.7 - 7.7 K/uL   Lymphocytes Relative 14 %   Lymphs Abs 1.3 0.7 - 4.0 K/uL   Monocytes Relative 11 %   Monocytes Absolute 1.1 (H) 0 - 1 K/uL   Eosinophils Relative 0 %   Eosinophils Absolute 0.0 0 - 0 K/uL   Basophils Relative 0 %   Basophils Absolute 0.0 0 - 0 K/uL   Immature Granulocytes 0 %   Abs Immature Granulocytes 0.04 0.00 - 0.07 K/uL  I-Stat venous blood gas, ([facility] ED)     Status: Abnormal   Collection Time: 08/05/20 12:41 AM  Result Value Ref Range   pH, Ven 7.434 (H) 7.25 - 7.43   pCO2, Ven 45.2 44 - 60 mmHg   pO2, Ven 41.0 32 - 45 mmHg   Bicarbonate 30.3 (H) 20.0 - 28.0 mmol/L   TCO2 32 22 - 32 mmol/L   O2 Saturation 77.0 %   Acid-Base Excess 5.0 (H) 0.0 - 2.0 mmol/L   Sodium 133 (L) 135 - 145 mmol/L   Potassium 3.7 3.5 - 5.1 mmol/L   Calcium, Ion 1.13 (L) 1.15 - 1.40 mmol/L   HCT 56.0 (H) 39 - 52 %   Hemoglobin 19.0 (H) 13.0 - 17.0 g/dL   Patient temperature 46.6 F    Sample type VENOUS   Basic metabolic panel     Status: Abnormal   Collection Time: 08/05/20  1:10 AM  Result Value Ref Range   Sodium 133 (L) 135 - 145 mmol/L   Potassium 3.5 3.5 - 5.1 mmol/L   Chloride 94 (L) 98 -  111 mmol/L   CO2 26 22 - 32 mmol/L   Glucose, Bld 212 (H) 70 - 99 mg/dL   BUN 23 (H) 6 - 20 mg/dL   Creatinine, Ser 5.99 0.61 - 1.24 mg/dL   Calcium 8.5 (L) 8.9 - 10.3 mg/dL   GFR calc non Af Amer >60 >60 mL/min   GFR calc Af Amer >  60 >60 mL/min   Anion gap 13 5 - 15   Imaging Studies: No results found.  ED COURSE and MDM  Nursing notes, initial and subsequent vitals signs, including pulse oximetry, reviewed and interpreted by myself.  Vitals:   08/04/20 2352 08/05/20 0231 08/05/20 0430  BP: (!) 160/124 (!) 145/91 138/89  Pulse: (!) 114 (!) 114 (!) 105  Resp: 18 19 17   Temp: 98.5 F (36.9 C) 98.5 F (36.9 C)   TempSrc: Oral Oral   SpO2: 99% 100% 100%  Weight: 55.8 kg    Height: 5\' 6"  (1.676 m)     Medications  sodium chloride 0.9 % bolus 2,000 mL ( Intravenous Stopped 08/05/20 0302)  ondansetron (ZOFRAN) injection 4 mg (4 mg Intravenous Given 08/05/20 0044)  metoCLOPramide (REGLAN) injection 10 mg (10 mg Intravenous Given 08/05/20 0156)  lactated ringers bolus 1,000 mL ( Intravenous Stopped 08/05/20 0410)   1:18 AM 2 L normal saline bolus initiated.  5:08 AM Patient feeling significantly better after 2 L of normal saline and 1 L of lactated Ringer solution.  He is able to drink fluids without vomiting and is ambulatory without difficulty.  He states he is ready to go home.  No sign of diabetic ketoacidosis on blood gas.  Presentation is more consistent with a viral gastroenteritis.   PROCEDURES  Procedures   ED DIAGNOSES     ICD-10-CM   1. Gastroenteritis  K52.9   2. Dehydration  E86.0        Jaun Galluzzo, 08/07/20, MD 08/05/20 [phone]

## 2020-08-06 ENCOUNTER — Emergency Department (HOSPITAL_BASED_OUTPATIENT_CLINIC_OR_DEPARTMENT_OTHER): Payer: BLUE CROSS/BLUE SHIELD

## 2020-08-06 ENCOUNTER — Other Ambulatory Visit: Payer: Self-pay

## 2020-08-06 ENCOUNTER — Emergency Department (HOSPITAL_BASED_OUTPATIENT_CLINIC_OR_DEPARTMENT_OTHER)
Admission: EM | Admit: 2020-08-06 | Discharge: 2020-08-07 | Disposition: A | Discharge status: Home / Self Care | Payer: BLUE CROSS/BLUE SHIELD | Attending: Emergency Medicine | Admitting: Emergency Medicine

## 2020-08-06 DIAGNOSIS — R1013 Epigastric pain: Secondary | ICD-10-CM | POA: Insufficient documentation

## 2020-08-06 DIAGNOSIS — K224 Dyskinesia of esophagus: Secondary | ICD-10-CM | POA: Diagnosis not present

## 2020-08-06 DIAGNOSIS — Z794 Long term (current) use of insulin: Secondary | ICD-10-CM | POA: Diagnosis not present

## 2020-08-06 DIAGNOSIS — R079 Chest pain, unspecified: Secondary | ICD-10-CM | POA: Diagnosis present

## 2020-08-06 DIAGNOSIS — F1290 Cannabis use, unspecified, uncomplicated: Secondary | ICD-10-CM | POA: Insufficient documentation

## 2020-08-06 LAB — CBC
HCT: 50.2 % (ref 39.0–52.0)
Hemoglobin: 16.3 g/dL (ref 13.0–17.0)
MCH: 24.6 pg — ABNORMAL LOW (ref 26.0–34.0)
MCHC: 32.5 g/dL (ref 30.0–36.0)
MCV: 75.7 fL — ABNORMAL LOW (ref 80.0–100.0)
Platelets: 230 10*3/uL (ref 150–400)
RBC: 6.63 MIL/uL — ABNORMAL HIGH (ref 4.22–5.81)
RDW: 13.8 % (ref 11.5–15.5)
WBC: 7.7 10*3/uL (ref 4.0–10.5)
nRBC: 0 % (ref 0.0–0.2)

## 2020-08-06 LAB — BASIC METABOLIC PANEL
Anion gap: 10 (ref 5–15)
BUN: 15 mg/dL (ref 6–20)
CO2: 26 mmol/L (ref 22–32)
Calcium: 8.5 mg/dL — ABNORMAL LOW (ref 8.9–10.3)
Chloride: 98 mmol/L (ref 98–111)
Creatinine, Ser: 0.85 mg/dL (ref 0.61–1.24)
GFR calc Af Amer: >60 mL/min (ref >60)
GFR calc non Af Amer: >60 mL/min (ref >60)
Glucose, Bld: 103 mg/dL — ABNORMAL HIGH (ref 70–99)
Potassium: 3.7 mmol/L (ref 3.5–5.1)
Sodium: 134 mmol/L — ABNORMAL LOW (ref 135–145)

## 2020-08-06 LAB — TROPONIN I (HIGH SENSITIVITY): Troponin I (High Sensitivity): 6 ng/L (ref <18)

## 2020-08-06 LAB — CBG MONITORING, ED: Glucose-Capillary: 97 mg/dL (ref 70–99)

## 2020-08-06 MED ORDER — DILTIAZEM HCL 25 MG/5ML IV SOLN: 20.0000 mg | Freq: Once | INTRAVENOUS | Status: DC

## 2020-08-06 MED ORDER — DEXTROSE 5 % IV BOLUS
1000.0000 mL | Freq: Once | INTRAVENOUS | Status: AC
  Administered 2020-08-06: 1000 mL via INTRAVENOUS

## 2020-08-06 MED ORDER — PANTOPRAZOLE SODIUM 40 MG IV SOLR
40.0000 mg | Freq: Once | INTRAVENOUS | Status: AC
  Administered 2020-08-06: 40 mg via INTRAVENOUS
  Filled 2020-08-06: qty 40

## 2020-08-06 MED ORDER — DILTIAZEM HCL 25 MG/5ML IV SOLN
12.5000 mg | Freq: Once | INTRAVENOUS | Status: AC
  Administered 2020-08-06: 12.5 mg via INTRAVENOUS
  Filled 2020-08-06: qty 5

## 2020-08-06 NOTE — ED Provider Notes (Signed)
[facility] Provider Note: Lowella Dell, MD, FACEP  CSN: [id number] MRN: [id number] ARRIVAL: 08/06/20 at 1813 ROOM: MH03/[facility]03   CHIEF COMPLAINT  Chest Pain and Abdominal Pain   HISTORY OF PRESENT ILLNESS  08/06/20 11:27 PM Anthony Graham is a 24 y.o. male who was seen by myself yesterday with several days of nausea and vomiting.  He had had diarrhea early in his course but that had abated.  At that time he denied any significant abdominal cramping but did have some mild epigastric pain which he attributed to vomiting.  He stated his vomiting had been persistent and he was unable to keep anything down, worse with eating or drinking.  He was given 2 L of normal saline and a liter of lactated Ringer's along with Zofran and Reglan.  He felt better and was discharged home.  Blood gas showed no evidence of diabetic ketoacidosis.  He returns complaining of alternating chest and epigastric pain which he describes as feeling like stabbing and aching.  He rates it as a 10 out of 10 at its worst.  It is worse when he tries to eat something or when he comes into a cold room.  He is no longer vomiting.  He admits to smoking marijuana and is concerned this may represent hyperemesis syndrome.  Is blood sugar was a self-reported 68 and he was given orange juice and peanut butter to eat.  The peanut butter exacerbated his chest pain.  His sugar was rechecked and found to be 97.   Past Medical History:  Diagnosis Date  . Diabetes mellitus without complication (HCC)     No past surgical history on file.  No family history on file.  Social History   Tobacco Use  . Smoking status: Never Smoker  . Smokeless tobacco: Never Used  Vaping Use  . Vaping Use: Never used  Substance Use Topics  . Alcohol use: Yes    Comment: occ  . Drug use: Yes    Types: Marijuana    Comment: daily since 2017    Prior to Admission medications   Medication Sig Start Date End Date Taking? Authorizing  Provider  Continuous Blood Gluc Sensor (FREESTYLE LIBRE 2 SENSOR) MISC  07/17/20   [provider]  gabapentin (NEURONTIN) 100 MG capsule Take by mouth. 03/17/20   [provider]  insulin aspart (NOVOLOG FLEXPEN) 100 UNIT/ML FlexPen Use as directed up to 90 units daily. Please make appt at [phone] for more refills. 01/30/17   [provider]  insulin aspart (NOVOLOG) 100 UNIT/ML injection Inject into the skin 3 (three) times daily before meals.    [provider]  insulin degludec (TRESIBA) 100 UNIT/ML SOPN FlexTouch Pen Inject into the skin. 02/25/18   [provider]  lisinopril (ZESTRIL) 10 MG tablet Take 10 mg by mouth daily. 06/25/20   [provider]  metoCLOPramide (REGLAN) 10 MG tablet Take 1 tablet (10 mg total) by mouth every 8 (eight) hours as needed for nausea or vomiting. 03/02/20   Petrucelli, Lelon Mast R, PA-C  omeprazole (PRILOSEC) 40 MG capsule Take 1 capsule (40 mg total) by mouth daily. 08/07/20   Tashiya Souders, Jonny Ruiz, MD  ondansetron (ZOFRAN ODT) 8 MG disintegrating tablet Take 1 tablet (8 mg total) by mouth every 8 (eight) hours as needed for nausea or vomiting. 08/05/20   Danielle Mink, Jonny Ruiz, MD  promethazine (PHENERGAN) 25 MG tablet Take 1 tablet (25 mg total) by mouth every 6 (six) hours as needed for  nausea or vomiting. 08/02/20   Liberty Handy, PA-C  potassium chloride SA (KLOR-CON) 20 MEQ tablet Take 1 tablet (20 mEq total) by mouth daily. 03/02/20 08/05/20  Petrucelli, Pleas Koch, PA-C    Allergies Patient has no known allergies.   REVIEW OF SYSTEMS  Negative except as noted here or in the History of Present Illness.   PHYSICAL EXAMINATION  Initial Vital Signs Blood pressure (!) 154/116, pulse 61, temperature 98.8 F (37.1 C), temperature source Oral, resp. rate 18, height 5\' 6"  (1.676 m), weight 51.8 kg, SpO2 100 %.  Examination General: Well-developed, well-nourished male in no acute distress; appearance consistent with age  of record HENT: normocephalic; atraumatic Eyes: pupils equal, round and reactive to light; extraocular muscles intact Neck: supple Heart: regular rate and rhythm Lungs: clear to auscultation bilaterally Abdomen: soft; nondistended; mild epigastric tenderness; no masses or hepatosplenomegaly; bowel sounds present Extremities: No deformity; full range of motion; pulses normal Neurologic: Awake, alert and oriented; motor function intact in all extremities and symmetric; no facial droop Skin: Warm and dry Psychiatric: Normal mood and affect   RESULTS  Summary of this visit's results, reviewed and interpreted by myself:   EKG Interpretation  Date/Time:  Friday August 06 2020 18:22:30 EDT Ventricular Rate:  108 PR Interval:  126 QRS Duration: 82 QT Interval:  332 QTC Calculation: 444 R Axis:   86 Text Interpretation: Sinus tachycardia Right atrial enlargement Borderline ECG No significant change was found Confirmed by Arden Tinoco (08-14-1978) on 08/06/2020 11:47:10 PM      Laboratory Studies: Results for orders placed or performed during the hospital encounter of 08/06/20 (from the past 24 hour(s))  Basic metabolic panel     Status: Abnormal   Collection Time: 08/06/20  6:35 PM  Result Value Ref Range   Sodium 134 (L) 135 - 145 mmol/L   Potassium 3.7 3.5 - 5.1 mmol/L   Chloride 98 98 - 111 mmol/L   CO2 26 22 - 32 mmol/L   Glucose, Bld 103 (H) 70 - 99 mg/dL   BUN 15 6 - 20 mg/dL   Creatinine, Ser 08/08/20 0.61 - 1.24 mg/dL   Calcium 8.5 (L) 8.9 - 10.3 mg/dL   GFR calc non Af Amer >60 >60 mL/min   GFR calc Af Amer >60 >60 mL/min   Anion gap 10 5 - 15  CBC     Status: Abnormal   Collection Time: 08/06/20  6:35 PM  Result Value Ref Range   WBC 7.7 4.0 - 10.5 K/uL   RBC 6.63 (H) 4.22 - 5.81 MIL/uL   Hemoglobin 16.3 13.0 - 17.0 g/dL   HCT 08/08/20 39 - 52 %   MCV 75.7 (L) 80.0 - 100.0 fL   MCH 24.6 (L) 26.0 - 34.0 pg   MCHC 32.5 30.0 - 36.0 g/dL   RDW 56.3 89.3 - 73.4 %   Platelets 230  150 - 400 K/uL   nRBC 0.0 0.0 - 0.2 %  Troponin I (High Sensitivity)     Status: None   Collection Time: 08/06/20  6:35 PM  Result Value Ref Range   Troponin I (High Sensitivity) 6 <18 ng/L  POC CBG, ED     Status: None   Collection Time: 08/06/20 11:15 PM  Result Value Ref Range   Glucose-Capillary 97 70 - 99 mg/dL   Imaging Studies: DG Chest 2 View  Result Date: 08/06/2020 CLINICAL DATA:  Chest pain x5 days. EXAM: CHEST - 2 VIEW COMPARISON:  None. FINDINGS: There  is no evidence of acute infiltrate, pleural effusion or pneumothorax. The heart size and mediastinal contours are within normal limits. The visualized skeletal structures are unremarkable. IMPRESSION: No active cardiopulmonary disease. Electronically Signed   By: Aram Candela M.D.   On: 08/06/2020 19:13    ED COURSE and MDM  Nursing notes, initial and subsequent vitals signs, including pulse oximetry, reviewed and interpreted by myself.  Vitals:   08/06/20 1834 08/06/20 2148 08/06/20 2355 08/07/20 0055  BP:  (!) 154/116 (!) 159/128 (!) 155/112  Pulse:  61 (!) 101 (!) 102  Resp:  18    Temp:      TempSrc:      SpO2:  100% 99% 100%  Weight: 51.8 kg     Height:       Medications  dextrose 5 % bolus 1,000 mL (1,000 mLs Intravenous New Bag/Given 08/06/20 2352)  pantoprazole (PROTONIX) injection 40 mg (40 mg Intravenous Given 08/06/20 2357)  diltiazem (CARDIZEM) injection 12.5 mg (12.5 mg Intravenous Given 08/06/20 2357)    1:34 AM Patient feeling better after IV Cardizem given for suspected esophageal spasm.  He states he feels hungry would like to be discharged so he can go home and eat.  We will start him on a PPI.  I suspect his esophageal spasm is due to recent vomiting.  He was advised to discontinue marijuana as this may be contributing to GI upset.  PROCEDURES  Procedures   ED DIAGNOSES     ICD-10-CM   1. Esophageal spasm  K22.4        Shaya Altamura, Jonny Ruiz, MD 08/07/20 [phone]

## 2020-08-06 NOTE — ED Notes (Signed)
Pt report CBg 67 on home monitor , OJ and Peanutbutter given

## 2020-08-06 NOTE — ED Notes (Signed)
Pts monitor was 95

## 2020-08-06 NOTE — ED Triage Notes (Signed)
Pt has had chest pain and heart burn since Sunday. Pt has taken tylenol at home with no relief. Pt states he has been seen Wednesday for the same with no relief. States has hx of gastroposis

## 2020-08-07 MED ORDER — OMEPRAZOLE 40 MG PO CPDR: 40.0000 mg | DELAYED_RELEASE_CAPSULE | Freq: Every day | ORAL | 0 refills | Status: DC

## 2021-02-10 DIAGNOSIS — E559 Vitamin D deficiency, unspecified: Secondary | ICD-10-CM | POA: Insufficient documentation

## 2021-02-10 DIAGNOSIS — R801 Persistent proteinuria, unspecified: Secondary | ICD-10-CM | POA: Insufficient documentation

## 2021-02-10 DIAGNOSIS — R3129 Other microscopic hematuria: Secondary | ICD-10-CM | POA: Insufficient documentation

## 2021-02-25 ENCOUNTER — Emergency Department (HOSPITAL_BASED_OUTPATIENT_CLINIC_OR_DEPARTMENT_OTHER)
Admission: EM | Admit: 2021-02-25 | Discharge: 2021-02-25 | Disposition: A | Discharge status: Home / Self Care | Payer: BLUE CROSS/BLUE SHIELD | Attending: Emergency Medicine | Admitting: Emergency Medicine

## 2021-02-25 ENCOUNTER — Other Ambulatory Visit: Payer: Self-pay

## 2021-02-25 ENCOUNTER — Encounter (HOSPITAL_BASED_OUTPATIENT_CLINIC_OR_DEPARTMENT_OTHER): Payer: Self-pay | Admitting: Emergency Medicine

## 2021-02-25 DIAGNOSIS — E119 Type 2 diabetes mellitus without complications: Secondary | ICD-10-CM | POA: Diagnosis not present

## 2021-02-25 DIAGNOSIS — Z794 Long term (current) use of insulin: Secondary | ICD-10-CM | POA: Diagnosis not present

## 2021-02-25 DIAGNOSIS — Z8639 Personal history of other endocrine, nutritional and metabolic disease: Secondary | ICD-10-CM

## 2021-02-25 DIAGNOSIS — R112 Nausea with vomiting, unspecified: Secondary | ICD-10-CM | POA: Diagnosis not present

## 2021-02-25 HISTORY — DX: Gastroparesis: K31.84

## 2021-02-25 LAB — COMPREHENSIVE METABOLIC PANEL
ALT: 19 U/L (ref 0–44)
AST: 25 U/L (ref 15–41)
Albumin: 3.9 g/dL (ref 3.5–5.0)
Alkaline Phosphatase: 133 U/L — ABNORMAL HIGH (ref 38–126)
Anion gap: 11 (ref 5–15)
BUN: 13 mg/dL (ref 6–20)
CO2: 23 mmol/L (ref 22–32)
Calcium: 8.7 mg/dL — ABNORMAL LOW (ref 8.9–10.3)
Chloride: 105 mmol/L (ref 98–111)
Creatinine, Ser: 0.97 mg/dL (ref 0.61–1.24)
GFR, Estimated: >60 mL/min (ref >60)
Glucose, Bld: 197 mg/dL — ABNORMAL HIGH (ref 70–99)
Potassium: 3.8 mmol/L (ref 3.5–5.1)
Sodium: 139 mmol/L (ref 135–145)
Total Bilirubin: 0.6 mg/dL (ref 0.3–1.2)
Total Protein: 6.9 g/dL (ref 6.5–8.1)

## 2021-02-25 LAB — CBC WITH DIFFERENTIAL/PLATELET
Abs Immature Granulocytes: 0.02 10*3/uL (ref 0.00–0.07)
Basophils Absolute: 0 10*3/uL (ref 0.0–0.1)
Basophils Relative: 0 %
Eosinophils Absolute: 0 10*3/uL (ref 0.0–0.5)
Eosinophils Relative: 0 %
HCT: 43.5 % (ref 39.0–52.0)
Hemoglobin: 13.7 g/dL (ref 13.0–17.0)
Immature Granulocytes: 0 %
Lymphocytes Relative: 8 %
Lymphs Abs: 0.8 10*3/uL (ref 0.7–4.0)
MCH: 25.1 pg — ABNORMAL LOW (ref 26.0–34.0)
MCHC: 31.5 g/dL (ref 30.0–36.0)
MCV: 79.8 fL — ABNORMAL LOW (ref 80.0–100.0)
Monocytes Absolute: 0.5 10*3/uL (ref 0.1–1.0)
Monocytes Relative: 6 %
Neutro Abs: 7.6 10*3/uL (ref 1.7–7.7)
Neutrophils Relative %: 86 %
Platelets: 192 10*3/uL (ref 150–400)
RBC: 5.45 MIL/uL (ref 4.22–5.81)
RDW: 14.6 % (ref 11.5–15.5)
WBC: 8.9 10*3/uL (ref 4.0–10.5)
nRBC: 0 % (ref 0.0–0.2)

## 2021-02-25 LAB — LIPASE, BLOOD: Lipase: 28 U/L (ref 11–51)

## 2021-02-25 LAB — CBG MONITORING, ED: Glucose-Capillary: 187 mg/dL — ABNORMAL HIGH (ref 70–99)

## 2021-02-25 MED ORDER — KETOROLAC TROMETHAMINE 30 MG/ML IJ SOLN
30.0000 mg | Freq: Once | INTRAMUSCULAR | Status: AC
  Administered 2021-02-25: 30 mg via INTRAVENOUS
  Filled 2021-02-25: qty 1

## 2021-02-25 MED ORDER — SODIUM CHLORIDE 0.9 % IV BOLUS
1000.0000 mL | Freq: Once | INTRAVENOUS | Status: AC
  Administered 2021-02-25: 1000 mL via INTRAVENOUS

## 2021-02-25 MED ORDER — PROMETHAZINE HCL 25 MG/ML IJ SOLN
INTRAMUSCULAR | Status: AC
  Administered 2021-02-25: 12.5 mg
  Filled 2021-02-25: qty 1

## 2021-02-25 MED ORDER — ONDANSETRON HCL 4 MG/2ML IJ SOLN
4.0000 mg | Freq: Once | INTRAMUSCULAR | Status: AC
  Administered 2021-02-25: 4 mg via INTRAVENOUS
  Filled 2021-02-25: qty 2

## 2021-02-25 MED ORDER — SODIUM CHLORIDE 0.9 % IV SOLN
12.5000 mg | Freq: Once | INTRAVENOUS | Status: DC
  Filled 2021-02-25: qty 0.5

## 2021-02-25 MED ORDER — METOCLOPRAMIDE HCL 5 MG/ML IJ SOLN
10.0000 mg | Freq: Once | INTRAMUSCULAR | Status: AC
  Administered 2021-02-25: 10 mg via INTRAVENOUS
  Filled 2021-02-25: qty 2

## 2021-02-25 NOTE — Discharge Instructions (Addendum)
Continue medications as previously prescribed.  Return to the emergency department if symptoms significantly worsen or change. 

## 2021-02-25 NOTE — ED Triage Notes (Signed)
Pt is c/o nausea and vomiting that started this evening around 7pm  Pt is c/o abd pain

## 2021-02-25 NOTE — ED Provider Notes (Signed)
[facility] EMERGENCY DEPARTMENT Provider Note   CSN: [id number] Arrival date & time: 02/25/21  0204     History Chief Complaint  Patient presents with  . Emesis    Anthony Graham is a 25 y.o. male.  Patient is a 25 year old male with history of type 1 diabetes.  He presents today with nausea and vomiting.  This started earlier today.  Patient has history of gastroparesis and this feels similar.  He denies any bloody stool or vomit.  He denies any fevers or chills.  He does describe some generalized abdominal cramping, but no focal pain.  The history is provided by the patient.  Emesis Severity:  Moderate Duration:  12 hours Timing:  Constant Quality:  Stomach contents Progression:  Worsening Chronicity:  Recurrent Recent urination:  Normal Relieved by:  Nothing Worsened by:  Nothing Ineffective treatments:  None tried      Past Medical History:  Diagnosis Date  . Diabetes mellitus without complication (HCC)   . Gastroparesis     There are no problems to display for this patient.   Past Surgical History:  Procedure Laterality Date  . EYE SURGERY         Family History  Problem Relation Age of Onset  . Diabetes Mother   . Diabetes Other     Social History   Tobacco Use  . Smoking status: Never Smoker  . Smokeless tobacco: Never Used  Vaping Use  . Vaping Use: Never used  Substance Use Topics  . Alcohol use: Yes    Comment: occ  . Drug use: Yes    Types: Marijuana    Comment: daily since 2017    Home Medications Prior to Admission medications   Medication Sig Start Date End Date Taking? Authorizing Provider  Continuous Blood Gluc Sensor (FREESTYLE LIBRE 2 SENSOR) MISC  07/17/20   [provider]  gabapentin (NEURONTIN) 100 MG capsule Take by mouth. 03/17/20   [provider]  insulin aspart (NOVOLOG FLEXPEN) 100 UNIT/ML FlexPen Use as directed up to 90 units daily. Please make appt at [phone] for more refills.  01/30/17   [provider]  insulin aspart (NOVOLOG) 100 UNIT/ML injection Inject into the skin 3 (three) times daily before meals.    [provider]  insulin degludec (TRESIBA) 100 UNIT/ML SOPN FlexTouch Pen Inject into the skin. 02/25/18   [provider]  lisinopril (ZESTRIL) 10 MG tablet Take 10 mg by mouth daily. 06/25/20   [provider]  metoCLOPramide (REGLAN) 10 MG tablet Take 1 tablet (10 mg total) by mouth every 8 (eight) hours as needed for nausea or vomiting. 03/02/20   Petrucelli, Lelon Mast R, PA-C  omeprazole (PRILOSEC) 40 MG capsule Take 1 capsule (40 mg total) by mouth daily. 08/07/20   Molpus, Jonny Ruiz, MD  ondansetron (ZOFRAN ODT) 8 MG disintegrating tablet Take 1 tablet (8 mg total) by mouth every 8 (eight) hours as needed for nausea or vomiting. 08/05/20   Molpus, Jonny Ruiz, MD  promethazine (PHENERGAN) 25 MG tablet Take 1 tablet (25 mg total) by mouth every 6 (six) hours as needed for nausea or vomiting. 08/02/20   Liberty Handy, PA-C  potassium chloride SA (KLOR-CON) 20 MEQ tablet Take 1 tablet (20 mEq total) by mouth daily. 03/02/20 08/05/20  Petrucelli, Pleas Koch, PA-C    Allergies    Patient has no known allergies.  Review of Systems   Review of Systems  Gastrointestinal: Positive for vomiting.  All other systems  reviewed and are negative.   Physical Exam Updated Vital Signs BP (!) 174/108 (BP Location: Left Arm)   Pulse 92   Temp 98.2 F (36.8 C) (Oral)   Resp 16   Ht 5\' 6"  (1.676 m)   Wt 62.1 kg   SpO2 100%   BMI 22.11 kg/m   Physical Exam Vitals and nursing note reviewed.  Constitutional:      General: He is not in acute distress.    Appearance: He is well-developed. He is not diaphoretic.  HENT:     Head: Normocephalic and atraumatic.  Cardiovascular:     Rate and Rhythm: Normal rate and regular rhythm.     Heart sounds: No murmur heard. No friction rub.  Pulmonary:     Effort: Pulmonary effort is normal. No  respiratory distress.     Breath sounds: Normal breath sounds. No wheezing or rales.  Abdominal:     General: Bowel sounds are normal. There is no distension.     Palpations: Abdomen is soft.     Tenderness: There is no abdominal tenderness.  Musculoskeletal:        General: Normal range of motion.     Cervical back: Normal range of motion and neck supple.  Skin:    General: Skin is warm and dry.  Neurological:     Mental Status: He is alert and oriented to person, place, and time.     Coordination: Coordination normal.     ED Results / Procedures / Treatments   Labs (all labs ordered are listed, but only abnormal results are displayed) Labs Reviewed  CBG MONITORING, ED - Abnormal; Notable for the following components:      Result Value   Glucose-Capillary 187 (*)    All other components within normal limits  COMPREHENSIVE METABOLIC PANEL  LIPASE, BLOOD  CBC WITH DIFFERENTIAL/PLATELET    EKG None  Radiology No results found.  Procedures Procedures   Medications Ordered in ED Medications  sodium chloride 0.9 % bolus 1,000 mL (has no administration in time range)  ondansetron (ZOFRAN) injection 4 mg (has no administration in time range)  metoCLOPramide (REGLAN) injection 10 mg (has no administration in time range)  ketorolac (TORADOL) 30 MG/ML injection 30 mg (has no administration in time range)    ED Course  I have reviewed the triage vital signs and the nursing notes.  Pertinent labs & imaging results that were available during my care of the patient were reviewed by me and considered in my medical decision making (see chart for details).    MDM Rules/Calculators/A&P  Patient is a 25 year old male with history of diabetes and gastroparesis.  He presents today for evaluation of vomiting.  Patient given IV fluids along with Reglan and Zofran, Toradol, and Phenergan.  Patient's vomiting initially resolved and patient is now feeling better.  He seems appropriate  for discharge.  Laboratory studies are unremarkable and abdomen is benign.  I see no indication for imaging studies.  Final Clinical Impression(s) / ED Diagnoses Final diagnoses:  None    Rx / DC Orders ED Discharge Orders    None       30, MD 02/25/21 [phone]

## 2021-02-26 ENCOUNTER — Other Ambulatory Visit: Payer: Self-pay

## 2021-02-26 ENCOUNTER — Emergency Department (HOSPITAL_BASED_OUTPATIENT_CLINIC_OR_DEPARTMENT_OTHER)
Admission: EM | Admit: 2021-02-26 | Discharge: 2021-02-26 | Disposition: A | Discharge status: Home / Self Care | Payer: BLUE CROSS/BLUE SHIELD | Attending: Emergency Medicine | Admitting: Emergency Medicine

## 2021-02-26 ENCOUNTER — Encounter (HOSPITAL_BASED_OUTPATIENT_CLINIC_OR_DEPARTMENT_OTHER): Payer: Self-pay

## 2021-02-26 DIAGNOSIS — R000 Tachycardia, unspecified: Secondary | ICD-10-CM | POA: Insufficient documentation

## 2021-02-26 DIAGNOSIS — K3184 Gastroparesis: Secondary | ICD-10-CM | POA: Insufficient documentation

## 2021-02-26 DIAGNOSIS — R112 Nausea with vomiting, unspecified: Secondary | ICD-10-CM

## 2021-02-26 DIAGNOSIS — R109 Unspecified abdominal pain: Secondary | ICD-10-CM | POA: Diagnosis present

## 2021-02-26 DIAGNOSIS — E1043 Type 1 diabetes mellitus with diabetic autonomic (poly)neuropathy: Secondary | ICD-10-CM | POA: Insufficient documentation

## 2021-02-26 DIAGNOSIS — Z794 Long term (current) use of insulin: Secondary | ICD-10-CM | POA: Insufficient documentation

## 2021-02-26 LAB — CBG MONITORING, ED: Glucose-Capillary: 155 mg/dL — ABNORMAL HIGH (ref 70–99)

## 2021-02-26 LAB — CBC WITH DIFFERENTIAL/PLATELET
Abs Immature Granulocytes: 0.07 10*3/uL (ref 0.00–0.07)
Basophils Absolute: 0 10*3/uL (ref 0.0–0.1)
Basophils Relative: 0 %
Eosinophils Absolute: 0 10*3/uL (ref 0.0–0.5)
Eosinophils Relative: 0 %
HCT: 45.4 % (ref 39.0–52.0)
Hemoglobin: 14.9 g/dL (ref 13.0–17.0)
Immature Granulocytes: 1 %
Lymphocytes Relative: 6 %
Lymphs Abs: 0.8 10*3/uL (ref 0.7–4.0)
MCH: 25.4 pg — ABNORMAL LOW (ref 26.0–34.0)
MCHC: 32.8 g/dL (ref 30.0–36.0)
MCV: 77.5 fL — ABNORMAL LOW (ref 80.0–100.0)
Monocytes Absolute: 1.1 10*3/uL — ABNORMAL HIGH (ref 0.1–1.0)
Monocytes Relative: 8 %
Neutro Abs: 11.2 10*3/uL — ABNORMAL HIGH (ref 1.7–7.7)
Neutrophils Relative %: 85 %
Platelets: 250 10*3/uL (ref 150–400)
RBC: 5.86 MIL/uL — ABNORMAL HIGH (ref 4.22–5.81)
RDW: 14.6 % (ref 11.5–15.5)
WBC: 13.2 10*3/uL — ABNORMAL HIGH (ref 4.0–10.5)
nRBC: 0 % (ref 0.0–0.2)

## 2021-02-26 LAB — LIPASE, BLOOD: Lipase: 27 U/L (ref 11–51)

## 2021-02-26 LAB — COMPREHENSIVE METABOLIC PANEL
ALT: 26 U/L (ref 0–44)
AST: 58 U/L — ABNORMAL HIGH (ref 15–41)
Albumin: 3.7 g/dL (ref 3.5–5.0)
Alkaline Phosphatase: 130 U/L — ABNORMAL HIGH (ref 38–126)
Anion gap: 12 (ref 5–15)
BUN: 18 mg/dL (ref 6–20)
CO2: 26 mmol/L (ref 22–32)
Calcium: 8.8 mg/dL — ABNORMAL LOW (ref 8.9–10.3)
Chloride: 99 mmol/L (ref 98–111)
Creatinine, Ser: 1.08 mg/dL (ref 0.61–1.24)
GFR, Estimated: >60 mL/min (ref >60)
Glucose, Bld: 172 mg/dL — ABNORMAL HIGH (ref 70–99)
Potassium: 3.4 mmol/L — ABNORMAL LOW (ref 3.5–5.1)
Sodium: 137 mmol/L (ref 135–145)
Total Bilirubin: 0.9 mg/dL (ref 0.3–1.2)
Total Protein: 6.9 g/dL (ref 6.5–8.1)

## 2021-02-26 MED ORDER — PROMETHAZINE HCL 25 MG/ML IJ SOLN
25.0000 mg | Freq: Once | INTRAMUSCULAR | Status: AC
  Administered 2021-02-26: 25 mg via INTRAMUSCULAR

## 2021-02-26 MED ORDER — SODIUM CHLORIDE 0.9 % IV BOLUS
1000.0000 mL | Freq: Once | INTRAVENOUS | Status: AC
  Administered 2021-02-26: 1000 mL via INTRAVENOUS

## 2021-02-26 MED ORDER — PROMETHAZINE HCL 25 MG RE SUPP: 25.0000 mg | Freq: Four times a day (QID) | RECTAL | 1 refills | Status: DC | PRN

## 2021-02-26 MED ORDER — ONDANSETRON 4 MG PO TBDP
4.0000 mg | ORAL_TABLET | Freq: Once | ORAL | Status: AC
  Administered 2021-02-26: 4 mg via ORAL
  Filled 2021-02-26: qty 1

## 2021-02-26 NOTE — ED Triage Notes (Signed)
Pt c/o N/V x2 days. Was seen here yesterday for same. States unable to keep anything down.

## 2021-02-26 NOTE — ED Notes (Signed)
Presents with nausea and vomiting, started 2 days ago. States unable to keep any POs down. Denies diarrhea or having fevers.

## 2021-02-26 NOTE — Discharge Instructions (Addendum)
Please read instructions below. Drink clear liquids until your stomach feels better. Then, slowly introduce bland foods into your diet as tolerated, such as bread, rice, apples, bananas. You can use the phenergan suppositories every 6 hours as needed for nausea. Please follow with your primary care provider. Return to the ER for severe abdominal pain, fever, uncontrollable vomiting, or new or concerning symptoms.

## 2021-02-26 NOTE — ED Provider Notes (Signed)
[facility] EMERGENCY DEPARTMENT Provider Note   CSN: [id number] Arrival date & time: 02/26/21  1207     History Chief Complaint  Patient presents with  . Abdominal Pain    Anthony Graham is a 25 y.o. male with PMHx T1DM, gastroparesis, presenting to the ED with complaint of recurring N/V and some abdominal cramping that began yesterday.  Patient was evaluated at this ED last night for symptoms.  He was given IV hydration and antiemetics with improvement and was discharged to home.  He states he has been taking the p.o. Zofran, however anytime he swallows the water in the tablet he vomits everything back up.  He is not been keeping much water down.  His symptoms feel like his typical gastroparesis.  He has been keeping an eye on his blood sugar over the last couple of days, he states mostly his sugars have been on the low end of normal.  He has a pod for his insulin dosing, states he did take some insulin today. Denies fevers, urinary symptoms, diarrhea, upper respiratory symptoms.  He is noted to be hypertensive on arrival, he takes lisinopril for blood pressure though has not kept the medication down today.  The history is provided by the patient and medical records.       Past Medical History:  Diagnosis Date  . Diabetes mellitus without complication (HCC)   . Gastroparesis     There are no problems to display for this patient.   Past Surgical History:  Procedure Laterality Date  . EYE SURGERY         Family History  Problem Relation Age of Onset  . Diabetes Mother   . Diabetes Other     Social History   Tobacco Use  . Smoking status: Never Smoker  . Smokeless tobacco: Never Used  Vaping Use  . Vaping Use: Never used  Substance Use Topics  . Alcohol use: Not Currently    Comment: occ  . Drug use: Yes    Types: Marijuana    Comment: daily since 2017    Home Medications Prior to Admission medications   Medication Sig Start Date End Date  Taking? Authorizing Provider  promethazine (PHENERGAN) 25 MG suppository Place 1 suppository (25 mg total) rectally every 6 (six) hours as needed for nausea or vomiting. 02/26/21  Yes Roxan Hockey, [place] N, PA-C  Continuous Blood Gluc Sensor (FREESTYLE LIBRE 2 SENSOR) MISC  07/17/20   [provider]  gabapentin (NEURONTIN) 100 MG capsule Take by mouth. 03/17/20   [provider]  insulin aspart (NOVOLOG FLEXPEN) 100 UNIT/ML FlexPen Use as directed up to 90 units daily. Please make appt at [phone] for more refills. 01/30/17   [provider]  insulin aspart (NOVOLOG) 100 UNIT/ML injection Inject into the skin 3 (three) times daily before meals.    [provider]  insulin degludec (TRESIBA) 100 UNIT/ML SOPN FlexTouch Pen Inject into the skin. 02/25/18   [provider]  lisinopril (ZESTRIL) 10 MG tablet Take 10 mg by mouth daily. 06/25/20   [provider]  metoCLOPramide (REGLAN) 10 MG tablet Take 1 tablet (10 mg total) by mouth every 8 (eight) hours as needed for nausea or vomiting. 03/02/20   Petrucelli, Lelon Mast R, PA-C  omeprazole (PRILOSEC) 40 MG capsule Take 1 capsule (40 mg total) by mouth daily. 08/07/20   Molpus, John, MD  ondansetron (ZOFRAN ODT) 8 MG disintegrating tablet Take 1 tablet (8 mg total) by mouth every 8 (  eight) hours as needed for nausea or vomiting. 08/05/20   Molpus, Jonny Ruiz, MD  promethazine (PHENERGAN) 25 MG tablet Take 1 tablet (25 mg total) by mouth every 6 (six) hours as needed for nausea or vomiting. 08/02/20   Liberty Handy, PA-C  potassium chloride SA (KLOR-CON) 20 MEQ tablet Take 1 tablet (20 mEq total) by mouth daily. 03/02/20 08/05/20  Petrucelli, Pleas Koch, PA-C    Allergies    Patient has no known allergies.  Review of Systems   Review of Systems  Constitutional: Negative for fever.  Gastrointestinal: Positive for nausea and vomiting. Negative for diarrhea.  All other systems reviewed and are  negative.   Physical Exam Updated Vital Signs BP (!) 155/123 (BP Location: Right Arm)   Pulse 98   Temp 98.4 F (36.9 C) (Oral)   Resp 18   Ht 5\' 6"  (1.676 m)   Wt 62.1 kg   SpO2 100%   BMI 22.11 kg/m   Physical Exam Vitals and nursing note reviewed.  Constitutional:      General: He is not in acute distress.    Appearance: He is well-developed.  HENT:     Head: Normocephalic and atraumatic.     Mouth/Throat:     Mouth: Mucous membranes are moist.  Eyes:     Conjunctiva/sclera: Conjunctivae normal.  Cardiovascular:     Rate and Rhythm: Regular rhythm. Tachycardia present.     Comments: 106 Pulmonary:     Effort: Pulmonary effort is normal. No respiratory distress.     Breath sounds: Normal breath sounds.  Abdominal:     General: Abdomen is flat. Bowel sounds are normal.     Palpations: Abdomen is soft.     Tenderness: There is abdominal tenderness (mild, generalized).  Skin:    General: Skin is warm.  Neurological:     Mental Status: He is alert.  Psychiatric:        Behavior: Behavior normal.     ED Results / Procedures / Treatments   Labs (all labs ordered are listed, but only abnormal results are displayed) Labs Reviewed  COMPREHENSIVE METABOLIC PANEL - Abnormal; Notable for the following components:      Result Value   Potassium 3.4 (*)    Glucose, Bld 172 (*)    Calcium 8.8 (*)    AST 58 (*)    Alkaline Phosphatase 130 (*)    All other components within normal limits  CBC WITH DIFFERENTIAL/PLATELET - Abnormal; Notable for the following components:   WBC 13.2 (*)    RBC 5.86 (*)    MCV 77.5 (*)    MCH 25.4 (*)    Neutro Abs 11.2 (*)    Monocytes Absolute 1.1 (*)    All other components within normal limits  CBG MONITORING, ED - Abnormal; Notable for the following components:   Glucose-Capillary 155 (*)    All other components within normal limits  LIPASE, BLOOD  URINALYSIS, ROUTINE W REFLEX MICROSCOPIC    EKG None  Radiology No results  found.  Procedures Procedures   Medications Ordered in ED Medications  ondansetron (ZOFRAN-ODT) disintegrating tablet 4 mg (4 mg Oral Given 02/26/21 1228)  promethazine (PHENERGAN) injection 25 mg (25 mg Intramuscular Given 02/26/21 1442)  sodium chloride 0.9 % bolus 1,000 mL (0 mLs Intravenous Stopped 02/26/21 1548)    ED Course  I have reviewed the triage vital signs and the nursing notes.  Pertinent labs & imaging results that were available during my care of the  patient were reviewed by me and considered in my medical decision making (see chart for details).  Clinical Course as of 02/26/21 Rickey Primus  YJE Feb 26, 2021  1652 Patient reports some improvement in symptoms and would like to PO challenge [JR]    Clinical Course User Index [JR] Robinson, [place] N, PA-C   MDM Rules/Calculators/A&P                          Pt w hx of Type 1 diabetes and gastroparesis, presenting back to the ED today for persisting nausea and vomiting. He is unable to keep down the PO zofran he has been taking. Reports symptoms feel typical for gastroparesis. His abdomen has generalized mild TTP, no peritoneal signs. He is afebrile, hypertensive though has not had his lisinopril today. Mildly tachycardic. CBG 155. No acute significant electrolyte abnormalities. Does have new leukocytosis since yesterday of 13.2, though this is likely related to patients vomiting. Low suspicion for surgical pathology. He is treated with IVF and phenergan and feels much better, requesting PO challenge on re-evaluation. He is tolerating PO liquids and feels comfortable with d/c to home. HR improved. Will prescribe PR phenergan for symptom management. He is aware of strict return precautions. Close PCP follow up recommended.  Discussed results, findings, treatment and follow up. Patient advised of return precautions. Patient verbalized understanding and agreed with plan.  Final Clinical Impression(s) / ED Diagnoses Final diagnoses:   Non-intractable vomiting with nausea, unspecified vomiting type  Gastroparesis    Rx / DC Orders ED Discharge Orders         Ordered    promethazine (PHENERGAN) 25 MG suppository  Every 6 hours PRN        02/26/21 1805           Robinson, [place] N, PA-C 02/26/21 1826    Little, Ambrose [place], MD 02/27/21 [phone]

## 2021-03-26 ENCOUNTER — Emergency Department (HOSPITAL_BASED_OUTPATIENT_CLINIC_OR_DEPARTMENT_OTHER): Payer: BLUE CROSS/BLUE SHIELD

## 2021-03-26 ENCOUNTER — Emergency Department (HOSPITAL_BASED_OUTPATIENT_CLINIC_OR_DEPARTMENT_OTHER)
Admission: EM | Admit: 2021-03-26 | Discharge: 2021-03-26 | Disposition: A | Discharge status: Home / Self Care | Payer: BLUE CROSS/BLUE SHIELD | Attending: Emergency Medicine | Admitting: Emergency Medicine

## 2021-03-26 ENCOUNTER — Other Ambulatory Visit: Payer: Self-pay

## 2021-03-26 ENCOUNTER — Encounter (HOSPITAL_BASED_OUTPATIENT_CLINIC_OR_DEPARTMENT_OTHER): Payer: Self-pay

## 2021-03-26 DIAGNOSIS — Z79899 Other long term (current) drug therapy: Secondary | ICD-10-CM | POA: Insufficient documentation

## 2021-03-26 DIAGNOSIS — Z794 Long term (current) use of insulin: Secondary | ICD-10-CM | POA: Insufficient documentation

## 2021-03-26 DIAGNOSIS — E1143 Type 2 diabetes mellitus with diabetic autonomic (poly)neuropathy: Secondary | ICD-10-CM | POA: Insufficient documentation

## 2021-03-26 DIAGNOSIS — R609 Edema, unspecified: Secondary | ICD-10-CM | POA: Diagnosis present

## 2021-03-26 LAB — BRAIN NATRIURETIC PEPTIDE: B Natriuretic Peptide: 51.2 pg/mL (ref 0.0–100.0)

## 2021-03-26 LAB — CBC WITH DIFFERENTIAL/PLATELET
Abs Immature Granulocytes: 0.02 10*3/uL (ref 0.00–0.07)
Basophils Absolute: 0.1 10*3/uL (ref 0.0–0.1)
Basophils Relative: 1 %
Eosinophils Absolute: 0.5 10*3/uL (ref 0.0–0.5)
Eosinophils Relative: 6 %
HCT: 43.1 % (ref 39.0–52.0)
Hemoglobin: 13.6 g/dL (ref 13.0–17.0)
Immature Granulocytes: 0 %
Lymphocytes Relative: 36 %
Lymphs Abs: 3 10*3/uL (ref 0.7–4.0)
MCH: 25.4 pg — ABNORMAL LOW (ref 26.0–34.0)
MCHC: 31.6 g/dL (ref 30.0–36.0)
MCV: 80.4 fL (ref 80.0–100.0)
Monocytes Absolute: 0.8 10*3/uL (ref 0.1–1.0)
Monocytes Relative: 9 %
Neutro Abs: 4.1 10*3/uL (ref 1.7–7.7)
Neutrophils Relative %: 48 %
Platelets: 274 10*3/uL (ref 150–400)
RBC: 5.36 MIL/uL (ref 4.22–5.81)
RDW: 15.6 % — ABNORMAL HIGH (ref 11.5–15.5)
WBC: 8.6 10*3/uL (ref 4.0–10.5)
nRBC: 0 % (ref 0.0–0.2)

## 2021-03-26 LAB — URINALYSIS, ROUTINE W REFLEX MICROSCOPIC
Bilirubin Urine: NEGATIVE
Glucose, UA: >=500 mg/dL — AB
Ketones, ur: NEGATIVE mg/dL
Leukocytes,Ua: NEGATIVE
Nitrite: NEGATIVE
Protein, ur: 100 mg/dL — AB
Specific Gravity, Urine: 1.02 (ref 1.005–1.030)
pH: 6.5 (ref 5.0–8.0)

## 2021-03-26 LAB — COMPREHENSIVE METABOLIC PANEL
ALT: 28 U/L (ref 0–44)
AST: 28 U/L (ref 15–41)
Albumin: 3.5 g/dL (ref 3.5–5.0)
Alkaline Phosphatase: 149 U/L — ABNORMAL HIGH (ref 38–126)
Anion gap: 9 (ref 5–15)
BUN: 10 mg/dL (ref 6–20)
CO2: 22 mmol/L (ref 22–32)
Calcium: 9.1 mg/dL (ref 8.9–10.3)
Chloride: 107 mmol/L (ref 98–111)
Creatinine, Ser: 0.88 mg/dL (ref 0.61–1.24)
GFR, Estimated: >60 mL/min (ref >60)
Glucose, Bld: 229 mg/dL — ABNORMAL HIGH (ref 70–99)
Potassium: 4.2 mmol/L (ref 3.5–5.1)
Sodium: 138 mmol/L (ref 135–145)
Total Bilirubin: 0.2 mg/dL — ABNORMAL LOW (ref 0.3–1.2)
Total Protein: 7.3 g/dL (ref 6.5–8.1)

## 2021-03-26 LAB — URINALYSIS, MICROSCOPIC (REFLEX)

## 2021-03-26 LAB — CBG MONITORING, ED: Glucose-Capillary: 220 mg/dL — ABNORMAL HIGH (ref 70–99)

## 2021-03-26 MED ORDER — MEDICAL COMPRESSION STOCKINGS MISC
0 refills | Status: DC
Start: 2021-03-26 — End: 2024-06-23

## 2021-03-26 MED ORDER — FUROSEMIDE 20 MG PO TABS: 20.0000 mg | ORAL_TABLET | Freq: Every day | ORAL | 0 refills | Status: DC

## 2021-03-26 NOTE — ED Notes (Signed)
Patient transported to X-ray 

## 2021-03-26 NOTE — ED Provider Notes (Signed)
[facility] EMERGENCY DEPARTMENT Provider Note   CSN: [id number] Arrival date & time: 03/26/21  1660     History Chief Complaint  Patient presents with  . Leg Swelling    Anthony Graham is a 25 y.o. male.  Patient presents to the emergency department for evaluation of leg swelling.  Patient reports that he has normally had some swelling of both of his feet but now he is swelling from the knees down.  Patient does stand at work for long periods of time.  He has not experiencing any chest pain or shortness of breath.        Past Medical History:  Diagnosis Date  . Diabetes mellitus without complication (HCC)   . Gastroparesis     There are no problems to display for this patient.   Past Surgical History:  Procedure Laterality Date  . EYE SURGERY         Family History  Problem Relation Age of Onset  . Diabetes Mother   . Diabetes Other     Social History   Tobacco Use  . Smoking status: Never Smoker  . Smokeless tobacco: Never Used  Vaping Use  . Vaping Use: Never used  Substance Use Topics  . Alcohol use: Not Currently    Comment: occ  . Drug use: Yes    Types: Marijuana    Comment: daily since 2017    Home Medications Prior to Admission medications   Medication Sig Start Date End Date Taking? Authorizing Provider  furosemide (LASIX) 20 MG tablet Take 1 tablet (20 mg total) by mouth daily for 3 days. 03/26/21 03/29/21 Yes Chad Tiznado, Canary Brim, MD  Continuous Blood Gluc Sensor (FREESTYLE LIBRE 2 SENSOR) MISC  07/17/20   [provider]  gabapentin (NEURONTIN) 100 MG capsule Take by mouth. 03/17/20   [provider]  insulin aspart (NOVOLOG FLEXPEN) 100 UNIT/ML FlexPen Use as directed up to 90 units daily. Please make appt at [phone] for more refills. 01/30/17   [provider]  insulin aspart (NOVOLOG) 100 UNIT/ML injection Inject into the skin 3 (three) times daily before meals.    [provider]   insulin degludec (TRESIBA) 100 UNIT/ML SOPN FlexTouch Pen Inject into the skin. 02/25/18   [provider]  lisinopril (ZESTRIL) 10 MG tablet Take 10 mg by mouth daily. 06/25/20   [provider]  metoCLOPramide (REGLAN) 10 MG tablet Take 1 tablet (10 mg total) by mouth every 8 (eight) hours as needed for nausea or vomiting. 03/02/20   Petrucelli, Lelon Mast R, PA-C  omeprazole (PRILOSEC) 40 MG capsule Take 1 capsule (40 mg total) by mouth daily. 08/07/20   Molpus, Jonny Ruiz, MD  ondansetron (ZOFRAN ODT) 8 MG disintegrating tablet Take 1 tablet (8 mg total) by mouth every 8 (eight) hours as needed for nausea or vomiting. 08/05/20   Molpus, Jonny Ruiz, MD  promethazine (PHENERGAN) 25 MG suppository Place 1 suppository (25 mg total) rectally every 6 (six) hours as needed for nausea or vomiting. 02/26/21   Robinson, [place] N, PA-C  promethazine (PHENERGAN) 25 MG tablet Take 1 tablet (25 mg total) by mouth every 6 (six) hours as needed for nausea or vomiting. 08/02/20   Liberty Handy, PA-C  potassium chloride SA (KLOR-CON) 20 MEQ tablet Take 1 tablet (20 mEq total) by mouth daily. 03/02/20 08/05/20  Petrucelli, Pleas Koch, PA-C    Allergies    Patient has no known allergies.  Review of Systems   Review of Systems  Cardiovascular: Positive for leg swelling.  All other systems reviewed and are negative.   Physical Exam Updated Vital Signs BP (!) 154/113   Pulse (!) 102   Temp 97.7 F (36.5 C) (Oral)   Resp 14   Ht 5\' 6"  (1.676 m)   Wt 57.6 kg   SpO2 100%   BMI 20.50 kg/m   Physical Exam Vitals and nursing note reviewed.  Constitutional:      General: He is not in acute distress.    Appearance: Normal appearance. He is well-developed.  HENT:     Head: Normocephalic and atraumatic.     Right Ear: Hearing normal.     Left Ear: Hearing normal.     Nose: Nose normal.  Eyes:     Conjunctiva/sclera: Conjunctivae normal.     Pupils: Pupils are equal, round, and reactive to light.   Cardiovascular:     Rate and Rhythm: Regular rhythm.     Heart sounds: S1 normal and S2 normal. No murmur heard. No friction rub. No gallop.   Pulmonary:     Effort: Pulmonary effort is normal. No respiratory distress.     Breath sounds: Normal breath sounds.  Chest:     Chest wall: No tenderness.  Abdominal:     General: Bowel sounds are normal.     Palpations: Abdomen is soft.     Tenderness: There is no abdominal tenderness. There is no guarding or rebound. Negative signs include Murphy's sign and McBurney's sign.     Hernia: No hernia is present.  Musculoskeletal:        General: Normal range of motion.     Cervical back: Normal range of motion and neck supple.     Right lower leg: 2+ Edema present.     Left lower leg: 2+ Edema present.  Skin:    General: Skin is warm and dry.     Findings: No rash.  Neurological:     Mental Status: He is alert and oriented to person, place, and time.     GCS: GCS eye subscore is 4. GCS verbal subscore is 5. GCS motor subscore is 6.     Cranial Nerves: No cranial nerve deficit.     Sensory: No sensory deficit.     Coordination: Coordination normal.  Psychiatric:        Speech: Speech normal.        Behavior: Behavior normal.        Thought Content: Thought content normal.     ED Results / Procedures / Treatments   Labs (all labs ordered are listed, but only abnormal results are displayed) Labs Reviewed  CBC WITH DIFFERENTIAL/PLATELET - Abnormal; Notable for the following components:      Result Value   MCH 25.4 (*)    RDW 15.6 (*)    All other components within normal limits  COMPREHENSIVE METABOLIC PANEL - Abnormal; Notable for the following components:   Glucose, Bld 229 (*)    Alkaline Phosphatase 149 (*)    Total Bilirubin 0.2 (*)    All other components within normal limits  URINALYSIS, ROUTINE W REFLEX MICROSCOPIC - Abnormal; Notable for the following components:   Glucose, UA >=500 (*)    Hgb urine dipstick SMALL (*)     Protein, ur 100 (*)    All other components within normal limits  URINALYSIS, MICROSCOPIC (REFLEX) - Abnormal; Notable for the following components:   Bacteria, UA RARE (*)    All other components within normal  limits  CBG MONITORING, ED - Abnormal; Notable for the following components:   Glucose-Capillary 220 (*)    All other components within normal limits  BRAIN NATRIURETIC PEPTIDE    EKG None  Radiology DG Chest 2 View  Result Date: 03/26/2021 CLINICAL DATA:  Foot swelling, now going up legs EXAM: CHEST - 2 VIEW COMPARISON:  08/06/2020 FINDINGS: No consolidation, features of edema, pneumothorax, or effusion. Pulmonary vascularity is normally distributed. The cardiomediastinal contours are unremarkable. No acute osseous or soft tissue abnormality. IMPRESSION: No acute cardiopulmonary abnormality. Electronically Signed   By: Kreg Shropshire M.D.   On: 03/26/2021 05:03    Procedures Procedures   Medications Ordered in ED Medications - No data to display  ED Course  I have reviewed the triage vital signs and the nursing notes.  Pertinent labs & imaging results that were available during my care of the patient were reviewed by me and considered in my medical decision making (see chart for details).    MDM Rules/Calculators/A&P                          Patient presents to the emergency department for evaluation of swelling of his legs.  Patient has baseline pedal edema secondary to chronic proteinuria.  He reports that he usually has less swelling when he wakes up but recently has noticed increased swelling all the way up to the knees.  Patient's work-up is reassuring.  He does have a history of kidney disease secondary to his type 1 diabetes.  His creatinine is excellent today, better than his baseline.  His proteinuria is also improved, 100 today, previously greater than 300.  This does not seem to be the cause of his increased swelling.  This is likely dependent edema, he does  stand on his feet at work for long periods of time.  We will give work note for days of rest and put him on 3 days of low-dose Lasix.  Compression stockings for at work.  Follow-up with his nephrologist if this does not improve.  Final Clinical Impression(s) / ED Diagnoses Final diagnoses:  Peripheral edema    Rx / DC Orders ED Discharge Orders         Ordered    furosemide (LASIX) 20 MG tablet  Daily        03/26/21 0621           Gilda Crease, MD 03/26/21 [phone]

## 2021-03-26 NOTE — ED Triage Notes (Addendum)
Pt states his feet normally swell but goes back down. For approx the past week his feet have swollen and have not decreased and his started going up his legs. Denies pain or trauma. Ambulated to room without difficulty.

## 2021-05-24 ENCOUNTER — Other Ambulatory Visit (HOSPITAL_BASED_OUTPATIENT_CLINIC_OR_DEPARTMENT_OTHER): Payer: Self-pay

## 2021-05-24 ENCOUNTER — Other Ambulatory Visit: Payer: Self-pay

## 2021-05-24 ENCOUNTER — Emergency Department (HOSPITAL_BASED_OUTPATIENT_CLINIC_OR_DEPARTMENT_OTHER)
Admission: EM | Admit: 2021-05-24 | Discharge: 2021-05-24 | Disposition: A | Discharge status: Home / Self Care | Payer: BLUE CROSS/BLUE SHIELD | Attending: Emergency Medicine | Admitting: Emergency Medicine

## 2021-05-24 ENCOUNTER — Encounter (HOSPITAL_BASED_OUTPATIENT_CLINIC_OR_DEPARTMENT_OTHER): Payer: Self-pay

## 2021-05-24 ENCOUNTER — Emergency Department (HOSPITAL_BASED_OUTPATIENT_CLINIC_OR_DEPARTMENT_OTHER): Payer: BLUE CROSS/BLUE SHIELD

## 2021-05-24 ENCOUNTER — Encounter (HOSPITAL_BASED_OUTPATIENT_CLINIC_OR_DEPARTMENT_OTHER): Payer: Self-pay | Admitting: *Deleted

## 2021-05-24 ENCOUNTER — Emergency Department (HOSPITAL_BASED_OUTPATIENT_CLINIC_OR_DEPARTMENT_OTHER)
Admission: EM | Admit: 2021-05-24 | Discharge: 2021-05-24 | Disposition: A | Discharge status: Home / Self Care | Payer: BLUE CROSS/BLUE SHIELD | Source: Home / Self Care | Attending: Emergency Medicine | Admitting: Emergency Medicine

## 2021-05-24 DIAGNOSIS — R197 Diarrhea, unspecified: Secondary | ICD-10-CM | POA: Diagnosis not present

## 2021-05-24 DIAGNOSIS — D72829 Elevated white blood cell count, unspecified: Secondary | ICD-10-CM | POA: Insufficient documentation

## 2021-05-24 DIAGNOSIS — E1043 Type 1 diabetes mellitus with diabetic autonomic (poly)neuropathy: Secondary | ICD-10-CM | POA: Diagnosis not present

## 2021-05-24 DIAGNOSIS — Z794 Long term (current) use of insulin: Secondary | ICD-10-CM | POA: Insufficient documentation

## 2021-05-24 DIAGNOSIS — R112 Nausea with vomiting, unspecified: Secondary | ICD-10-CM | POA: Insufficient documentation

## 2021-05-24 DIAGNOSIS — E119 Type 2 diabetes mellitus without complications: Secondary | ICD-10-CM | POA: Insufficient documentation

## 2021-05-24 DIAGNOSIS — R1013 Epigastric pain: Secondary | ICD-10-CM | POA: Insufficient documentation

## 2021-05-24 DIAGNOSIS — K3184 Gastroparesis: Secondary | ICD-10-CM

## 2021-05-24 LAB — CBG MONITORING, ED
Glucose-Capillary: 260 mg/dL — ABNORMAL HIGH (ref 70–99)
Glucose-Capillary: 264 mg/dL — ABNORMAL HIGH (ref 70–99)

## 2021-05-24 LAB — COMPREHENSIVE METABOLIC PANEL
ALT: 21 U/L (ref 0–44)
ALT: 21 U/L (ref 0–44)
AST: 37 U/L (ref 15–41)
AST: 37 U/L (ref 15–41)
Albumin: 4.4 g/dL (ref 3.5–5.0)
Albumin: 4.1 g/dL (ref 3.5–5.0)
Alkaline Phosphatase: 125 U/L (ref 38–126)
Alkaline Phosphatase: 120 U/L (ref 38–126)
Anion gap: 16 — ABNORMAL HIGH (ref 5–15)
Anion gap: 13 (ref 5–15)
BUN: 23 mg/dL — ABNORMAL HIGH (ref 6–20)
BUN: 23 mg/dL — ABNORMAL HIGH (ref 6–20)
CO2: 24 mmol/L (ref 22–32)
CO2: 27 mmol/L (ref 22–32)
Calcium: 9.2 mg/dL (ref 8.9–10.3)
Calcium: 8.7 mg/dL — ABNORMAL LOW (ref 8.9–10.3)
Chloride: 97 mmol/L — ABNORMAL LOW (ref 98–111)
Chloride: 97 mmol/L — ABNORMAL LOW (ref 98–111)
Creatinine, Ser: 1.48 mg/dL — ABNORMAL HIGH (ref 0.61–1.24)
Creatinine, Ser: 1.42 mg/dL — ABNORMAL HIGH (ref 0.61–1.24)
GFR, Estimated: >60 mL/min (ref >60)
GFR, Estimated: >60 mL/min (ref >60)
Glucose, Bld: 268 mg/dL — ABNORMAL HIGH (ref 70–99)
Glucose, Bld: 275 mg/dL — ABNORMAL HIGH (ref 70–99)
Potassium: 3.4 mmol/L — ABNORMAL LOW (ref 3.5–5.1)
Potassium: 3.5 mmol/L (ref 3.5–5.1)
Sodium: 137 mmol/L (ref 135–145)
Sodium: 137 mmol/L (ref 135–145)
Total Bilirubin: 0.9 mg/dL (ref 0.3–1.2)
Total Bilirubin: 1 mg/dL (ref 0.3–1.2)
Total Protein: 8.3 g/dL — ABNORMAL HIGH (ref 6.5–8.1)
Total Protein: 7.7 g/dL (ref 6.5–8.1)

## 2021-05-24 LAB — CBC WITH DIFFERENTIAL/PLATELET
Abs Immature Granulocytes: 0.04 10*3/uL (ref 0.00–0.07)
Basophils Absolute: 0 10*3/uL (ref 0.0–0.1)
Basophils Relative: 0 %
Eosinophils Absolute: 0 10*3/uL (ref 0.0–0.5)
Eosinophils Relative: 0 %
HCT: 44 % (ref 39.0–52.0)
Hemoglobin: 14.5 g/dL (ref 13.0–17.0)
Immature Granulocytes: 0 %
Lymphocytes Relative: 8 %
Lymphs Abs: 1 10*3/uL (ref 0.7–4.0)
MCH: 25.6 pg — ABNORMAL LOW (ref 26.0–34.0)
MCHC: 33 g/dL (ref 30.0–36.0)
MCV: 77.6 fL — ABNORMAL LOW (ref 80.0–100.0)
Monocytes Absolute: 1.4 10*3/uL — ABNORMAL HIGH (ref 0.1–1.0)
Monocytes Relative: 11 %
Neutro Abs: 10.2 10*3/uL — ABNORMAL HIGH (ref 1.7–7.7)
Neutrophils Relative %: 81 %
Platelets: 224 10*3/uL (ref 150–400)
RBC: 5.67 MIL/uL (ref 4.22–5.81)
RDW: 13.7 % (ref 11.5–15.5)
WBC: 12.6 10*3/uL — ABNORMAL HIGH (ref 4.0–10.5)
nRBC: 0 % (ref 0.0–0.2)

## 2021-05-24 LAB — LIPASE, BLOOD: Lipase: 24 U/L (ref 11–51)

## 2021-05-24 LAB — URINALYSIS, MICROSCOPIC (REFLEX)

## 2021-05-24 LAB — URINALYSIS, ROUTINE W REFLEX MICROSCOPIC
Bilirubin Urine: NEGATIVE
Glucose, UA: >=500 mg/dL — AB
Ketones, ur: 15 mg/dL — AB
Leukocytes,Ua: NEGATIVE
Nitrite: NEGATIVE
Protein, ur: >300 mg/dL — AB
Specific Gravity, Urine: 1.02 (ref 1.005–1.030)
pH: 7 (ref 5.0–8.0)

## 2021-05-24 LAB — CBC
HCT: 43.1 % (ref 39.0–52.0)
Hemoglobin: 14.4 g/dL (ref 13.0–17.0)
MCH: 25.8 pg — ABNORMAL LOW (ref 26.0–34.0)
MCHC: 33.4 g/dL (ref 30.0–36.0)
MCV: 77.1 fL — ABNORMAL LOW (ref 80.0–100.0)
Platelets: 232 10*3/uL (ref 150–400)
RBC: 5.59 MIL/uL (ref 4.22–5.81)
RDW: 13.6 % (ref 11.5–15.5)
WBC: 11.8 10*3/uL — ABNORMAL HIGH (ref 4.0–10.5)
nRBC: 0 % (ref 0.0–0.2)

## 2021-05-24 MED ORDER — ONDANSETRON 4 MG PO TBDP
4.0000 mg | ORAL_TABLET | Freq: Three times a day (TID) | ORAL | 0 refills | Status: DC | PRN
  Filled 2021-05-24: qty 12, 4d supply, fill #0

## 2021-05-24 MED ORDER — DICYCLOMINE HCL 10 MG PO CAPS
10.0000 mg | ORAL_CAPSULE | Freq: Once | ORAL | Status: AC
  Administered 2021-05-24: 10 mg via ORAL
  Filled 2021-05-24: qty 1

## 2021-05-24 MED ORDER — FENTANYL CITRATE (PF) 100 MCG/2ML IJ SOLN
25.0000 ug | Freq: Once | INTRAMUSCULAR | Status: AC
  Administered 2021-05-24: 25 ug via INTRAVENOUS
  Filled 2021-05-24: qty 2

## 2021-05-24 MED ORDER — POTASSIUM CHLORIDE CRYS ER 20 MEQ PO TBCR
20.0000 meq | EXTENDED_RELEASE_TABLET | Freq: Once | ORAL | Status: AC
  Administered 2021-05-24: 20 meq via ORAL
  Filled 2021-05-24: qty 1

## 2021-05-24 MED ORDER — ONDANSETRON HCL 4 MG/2ML IJ SOLN
4.0000 mg | Freq: Once | INTRAMUSCULAR | Status: AC
  Administered 2021-05-24: 4 mg via INTRAVENOUS
  Filled 2021-05-24: qty 2

## 2021-05-24 MED ORDER — DICYCLOMINE HCL 20 MG PO TABS: 20.0000 mg | ORAL_TABLET | Freq: Two times a day (BID) | ORAL | 0 refills | Status: DC

## 2021-05-24 MED ORDER — SODIUM CHLORIDE 0.9 % IV BOLUS
1000.0000 mL | Freq: Once | INTRAVENOUS | Status: AC
Start: 2021-05-24 — End: 2021-05-24
  Administered 2021-05-24: 1000 mL via INTRAVENOUS

## 2021-05-24 MED ORDER — METOCLOPRAMIDE HCL 5 MG/ML IJ SOLN
10.0000 mg | Freq: Once | INTRAMUSCULAR | Status: AC
  Administered 2021-05-24: 10 mg via INTRAVENOUS
  Filled 2021-05-24: qty 2

## 2021-05-24 MED ORDER — FENTANYL CITRATE (PF) 100 MCG/2ML IJ SOLN
50.0000 ug | Freq: Once | INTRAMUSCULAR | Status: AC
Start: 2021-05-24 — End: 2021-05-24
  Administered 2021-05-24: 50 ug via INTRAVENOUS
  Filled 2021-05-24: qty 2

## 2021-05-24 MED ORDER — HALOPERIDOL LACTATE 5 MG/ML IJ SOLN
1.0000 mg | Freq: Once | INTRAMUSCULAR | Status: AC
  Administered 2021-05-24: 1 mg via INTRAVENOUS
  Filled 2021-05-24: qty 1

## 2021-05-24 NOTE — ED Provider Notes (Signed)
[facility] EMERGENCY DEPARTMENT Provider Note   CSN: [id number] Arrival date & time: 05/24/21  2206     History Chief Complaint  Patient presents with   Abdominal Pain    Anthony Graham is a 25 y.o. male.  HPI 25 year old male with a history of DM type II, gastroparesis presents to the ER with complaints of gastroparesis.  Patient seen earlier here at [facility] by prior provider.  Had overall reassuring work-up, no signs of DKA.  Given fluids, fentanyl, Reglan with improvement.  He returns just several hours later with complaints of worsening abdominal pain which has been keeping him up.  He also endorses several episodes of nonbloody nonbilious emesis.  He also states that his blood sugars have been running in the 2-300 range.  He has been taking Motrin and Reglan at home with little relief.    Past Medical History:  Diagnosis Date   Diabetes mellitus without complication (HCC)    Gastroparesis     There are no problems to display for this patient.   Past Surgical History:  Procedure Laterality Date   EYE SURGERY         Family History  Problem Relation Age of Onset   Diabetes Mother    Diabetes Other     Social History   Tobacco Use   Smoking status: Never   Smokeless tobacco: Never  Vaping Use   Vaping Use: Never used  Substance Use Topics   Alcohol use: Not Currently    Comment: occ   Drug use: Yes    Types: Marijuana    Comment: daily since 2017    Home Medications Prior to Admission medications   Medication Sig Start Date End Date Taking? Authorizing Provider  dicyclomine (BENTYL) 20 MG tablet Take 1 tablet (20 mg total) by mouth 2 (two) times daily. 05/24/21  Yes Mare Ferrari, PA-C  Continuous Blood Gluc Sensor (FREESTYLE LIBRE 2 SENSOR) MISC  07/17/20   [provider]  Elastic Bandages & Supports (MEDICAL COMPRESSION STOCKINGS) MISC Dispense 1 pair of knee-high compression stockings, to be used when you are  continuously standing 03/26/21   Pollina, Canary Brim, MD  furosemide (LASIX) 20 MG tablet Take 1 tablet (20 mg total) by mouth daily for 3 days. 03/26/21 03/29/21  Gilda Crease, MD  gabapentin (NEURONTIN) 100 MG capsule Take by mouth. 03/17/20   [provider]  insulin aspart (NOVOLOG FLEXPEN) 100 UNIT/ML FlexPen Use as directed up to 90 units daily. Please make appt at [phone] for more refills. 01/30/17   [provider]  insulin aspart (NOVOLOG) 100 UNIT/ML injection Inject into the skin 3 (three) times daily before meals.    [provider]  insulin degludec (TRESIBA) 100 UNIT/ML SOPN FlexTouch Pen Inject into the skin. 02/25/18   [provider]  lisinopril (ZESTRIL) 10 MG tablet Take 10 mg by mouth daily. 06/25/20   [provider]  metoCLOPramide (REGLAN) 10 MG tablet Take 1 tablet (10 mg total) by mouth every 8 (eight) hours as needed for nausea or vomiting. 03/02/20   Petrucelli, Lelon Mast R, PA-C  omeprazole (PRILOSEC) 40 MG capsule Take 1 capsule (40 mg total) by mouth daily. 08/07/20   Molpus, Jonny Ruiz, MD  ondansetron (ZOFRAN ODT) 4 MG disintegrating tablet Take 1 tablet (4 mg total) by mouth every 8 (eight) hours as needed for nausea or vomiting. 05/24/21   Placido Sou, PA-C  promethazine (PHENERGAN) 25 MG suppository Place 1 suppository (  25 mg total) rectally every 6 (six) hours as needed for nausea or vomiting. 02/26/21   Robinson, [place] N, PA-C  promethazine (PHENERGAN) 25 MG tablet Take 1 tablet (25 mg total) by mouth every 6 (six) hours as needed for nausea or vomiting. 08/02/20   Liberty Handy, PA-C  potassium chloride SA (KLOR-CON) 20 MEQ tablet Take 1 tablet (20 mEq total) by mouth daily. 03/02/20 08/05/20  Petrucelli, Pleas Koch, PA-C    Allergies    Patient has no known allergies.  Review of Systems   Review of Systems  Constitutional:  Negative for chills and fever.  HENT:  Negative for ear pain and sore throat.    Eyes:  Negative for pain and visual disturbance.  Respiratory:  Negative for cough and shortness of breath.   Cardiovascular:  Negative for chest pain and palpitations.  Gastrointestinal:  Positive for abdominal pain, nausea and vomiting.  Genitourinary:  Negative for dysuria and hematuria.  Musculoskeletal:  Negative for arthralgias and back pain.  Skin:  Negative for color change and rash.  Neurological:  Negative for seizures and syncope.  All other systems reviewed and are negative.  Physical Exam Updated Vital Signs BP (!) 170/118 (BP Location: Left Arm)   Pulse (!) 108   Temp 99.3 F (37.4 C) (Oral)   Resp 18   Ht 5\' 6"  (1.676 m)   Wt 59 kg   SpO2 94%   BMI 20.99 kg/m   Physical Exam Vitals and nursing note reviewed.  Constitutional:      Appearance: He is well-developed.  HENT:     Head: Normocephalic and atraumatic.  Eyes:     Conjunctiva/sclera: Conjunctivae normal.  Cardiovascular:     Rate and Rhythm: Normal rate and regular rhythm.     Heart sounds: No murmur heard. Pulmonary:     Effort: Pulmonary effort is normal. No respiratory distress.     Breath sounds: Normal breath sounds.  Abdominal:     Palpations: Abdomen is soft.     Tenderness: There is abdominal tenderness. There is no right CVA tenderness, left CVA tenderness or guarding. Negative signs include Murphy's sign and McBurney's sign.     Comments: Abdomen soft, with mild tenderness to the epigastrium, but no focal tenderness.  Negative Murphy's.  Negative McBurney's.  No CVA tenderness.  Musculoskeletal:     Cervical back: Neck supple.  Skin:    General: Skin is warm and dry.     Coloration: Skin is not jaundiced.  Neurological:     General: No focal deficit present.     Mental Status: He is alert.     Cranial Nerves: No cranial nerve deficit.     Motor: No weakness.    ED Results / Procedures / Treatments   Labs (all labs ordered are listed, but only abnormal results are displayed) Labs  Reviewed  CBC WITH DIFFERENTIAL/PLATELET - Abnormal; Notable for the following components:      Result Value   WBC 12.6 (*)    MCV 77.6 (*)    MCH 25.6 (*)    Neutro Abs 10.2 (*)    Monocytes Absolute 1.4 (*)    All other components within normal limits  COMPREHENSIVE METABOLIC PANEL - Abnormal; Notable for the following components:   Chloride 97 (*)    Glucose, Bld 275 (*)    BUN 23 (*)    Creatinine, Ser 1.42 (*)    Calcium 8.7 (*)    All other components within normal limits  CBG MONITORING, ED - Abnormal; Notable for the following components:   Glucose-Capillary 264 (*)    All other components within normal limits  URINALYSIS, ROUTINE W REFLEX MICROSCOPIC    EKG EKG Interpretation  Date/Time:  Tuesday May 24 2021 22:31:32 EDT Ventricular Rate:  110 PR Interval:  138 QRS Duration: 83 QT Interval:  332 QTC Calculation: 450 R Axis:   63 Text Interpretation: Sinus tachycardia Atrial premature complex Consider right atrial enlargement normal axis No acute ischemia Confirmed by Pieter Partridge ([id number]) on 05/24/2021 10:39:06 PM  Radiology DG Abdomen 1 View  Result Date: 05/24/2021 CLINICAL DATA:  Gastroparesis EXAM: ABDOMEN - 1 VIEW COMPARISON:  None. FINDINGS: Borderline dilatation of small-bowel loops in the mid abdomen. No radio-opaque calculi or other significant radiographic abnormality are seen. IMPRESSION: Borderline dilatation of small-bowel loops in the mid abdomen. Electronically Signed   By: Deatra Robinson M.D.   On: 05/24/2021 23:15    Procedures Procedures   Medications Ordered in ED Medications  dicyclomine (BENTYL) capsule 10 mg (10 mg Oral Given 05/24/21 2244)  haloperidol lactate (HALDOL) injection 1 mg (1 mg Intravenous Given 05/24/21 2245)    ED Course  I have reviewed the triage vital signs and the nursing notes.  Pertinent labs & imaging results that were available during my care of the patient were reviewed by me and considered in my medical decision  making (see chart for details).    MDM Rules/Calculators/A&P                          25 year old male who presents to the ER with gastroparesis.  Seen here earlier in the same day for the same.  On arrival, he did arrive tachycardic with a rate of 108, afebrile, hypertensive.  This improved throughout the ED course.  Abdomen is soft, with mild tenderness in the epigastrium but no focal tenderness.  Labs and imaging ordered, reviewed and interpreted by me.  CBC with a leukocytosis of 12.6, largely unchanged, likely reactive.  CMP with a baseline creatinine, glucose of 275, but normal bicarb and normal anion gap.  KUB with borderline dilation of small bowel loops but no signs of small bowel obstruction. Low suspicion given generally benign abdominal exam.   Patient received Tylenol, Bentyl, I did discuss further plan of care for the patient and offering admission given 2 ER visits in the same day.  He reports improvement in his symptoms, he was able to tolerate water here in the ER.  Would like to go home with Bentyl.  His mother at bedside states that he does have follow-up with a GI specialist in September   Final Clinical Impression(s) / ED Diagnoses Final diagnoses:  Gastroparesis    Rx / DC Orders ED Discharge Orders          Ordered    dicyclomine (BENTYL) 20 MG tablet  2 times daily        05/24/21 2301             Mare Ferrari, PA-C 05/24/21 2320    Koleen Distance, MD 05/24/21 2326

## 2021-05-24 NOTE — ED Triage Notes (Signed)
States he was here earlier with gastroparesis. He was given IV fluid. States he cant sleep due to abdominal pain.

## 2021-05-24 NOTE — ED Triage Notes (Addendum)
Pt c/o N/V/D since yesterday. Unable to tolerate PO. Hx of gastroparesis. Denies fever. Smokes marijuana, last used 2 days ago.

## 2021-05-24 NOTE — Discharge Instructions (Signed)
You were evaluated in the Emergency Department and after careful evaluation, we did not find any emergent condition requiring admission or further testing in the hospital.  Please take the Bentyl as needed.  Make sure to keep your GI appointment.   Please return to the Emergency Department if you experience any worsening of your condition.  Thank you for allowing [place] to be a part of your care.

## 2021-05-24 NOTE — Discharge Instructions (Signed)
I am prescribing you a medication called Zofran.  This is a disintegrating tablet you can use up to 3 times a day for management of nausea and vomiting.  Please only take this as prescribed.  Please only take this if you are experiencing nausea and vomiting that you cannot control.  If you develop any new or worsening symptoms please come back to the emergency department.  Otherwise, please follow-up with your regular doctor as well as your gastroenterologist.  It was a pleasure to meet you.

## 2021-05-24 NOTE — ED Notes (Signed)
Pt provided with water for PO challenge.

## 2021-05-24 NOTE — ED Provider Notes (Signed)
[facility] EMERGENCY DEPARTMENT Provider Note   CSN: [id number] Arrival date & time: 05/24/21  1043     History Chief Complaint  Patient presents with   Emesis    Anthony Graham is a 25 y.o. male.  HPI Patient is a 25 year old male with a history of type I diabetes mellitus as well as gastroparesis who presents to the emergency department due to upper abdominal pain as well as intractable nausea and vomiting that started yesterday morning.  Patient confirms his current symptoms are similar to prior gastroparesis exacerbations.  He has not been able to tolerate any p.o. intake for the last 24 hours due to this.  Denies any chest pain, shortness of breath, URI symptoms, urinary complaints.  Also notes intermittent diarrhea.  Patient states he has an insulin pump.  He smokes marijuana.  EGD performed on May 6 with findings as noted below:  The esophageal mucosa appeared normal. Mild amount of semisolid food seen in the gastric body in keeping with gastroparesis..The gastric mucosa appeared normal. The first and second part of the duodenum appeared normal.    Past Medical History:  Diagnosis Date   Diabetes mellitus without complication (HCC)    Gastroparesis     There are no problems to display for this patient.   Past Surgical History:  Procedure Laterality Date   EYE SURGERY         Family History  Problem Relation Age of Onset   Diabetes Mother    Diabetes Other     Social History   Tobacco Use   Smoking status: Never   Smokeless tobacco: Never  Vaping Use   Vaping Use: Never used  Substance Use Topics   Alcohol use: Not Currently    Comment: occ   Drug use: Yes    Types: Marijuana    Comment: daily since 2017    Home Medications Prior to Admission medications   Medication Sig Start Date End Date Taking? Authorizing Provider  ondansetron (ZOFRAN ODT) 4 MG disintegrating tablet Take 1 tablet (4 mg total) by mouth every 8 (eight) hours as  needed for nausea or vomiting. 05/24/21  Yes Placido Sou, PA-C  Continuous Blood Gluc Sensor (FREESTYLE LIBRE 2 SENSOR) MISC  07/17/20   [provider]  Elastic Bandages & Supports (MEDICAL COMPRESSION STOCKINGS) MISC Dispense 1 pair of knee-high compression stockings, to be used when you are continuously standing 03/26/21   Pollina, Canary Brim, MD  furosemide (LASIX) 20 MG tablet Take 1 tablet (20 mg total) by mouth daily for 3 days. 03/26/21 03/29/21  Gilda Crease, MD  gabapentin (NEURONTIN) 100 MG capsule Take by mouth. 03/17/20   [provider]  insulin aspart (NOVOLOG FLEXPEN) 100 UNIT/ML FlexPen Use as directed up to 90 units daily. Please make appt at [phone] for more refills. 01/30/17   [provider]  insulin aspart (NOVOLOG) 100 UNIT/ML injection Inject into the skin 3 (three) times daily before meals.    [provider]  insulin degludec (TRESIBA) 100 UNIT/ML SOPN FlexTouch Pen Inject into the skin. 02/25/18   [provider]  lisinopril (ZESTRIL) 10 MG tablet Take 10 mg by mouth daily. 06/25/20   [provider]  metoCLOPramide (REGLAN) 10 MG tablet Take 1 tablet (10 mg total) by mouth every 8 (eight) hours as needed for nausea or vomiting. 03/02/20   Petrucelli, Lelon Mast R, PA-C  omeprazole (PRILOSEC) 40 MG capsule Take 1 capsule (40 mg total) by mouth daily. 08/07/20  Molpus, John, MD  promethazine (PHENERGAN) 25 MG suppository Place 1 suppository (25 mg total) rectally every 6 (six) hours as needed for nausea or vomiting. 02/26/21   Robinson, [place] N, PA-C  promethazine (PHENERGAN) 25 MG tablet Take 1 tablet (25 mg total) by mouth every 6 (six) hours as needed for nausea or vomiting. 08/02/20   Liberty Handy, PA-C  potassium chloride SA (KLOR-CON) 20 MEQ tablet Take 1 tablet (20 mEq total) by mouth daily. 03/02/20 08/05/20  Petrucelli, Pleas Koch, PA-C    Allergies    Patient has no known allergies.  Review of  Systems   Review of Systems  All other systems reviewed and are negative. Ten systems reviewed and are negative for acute change, except as noted in the HPI.   Physical Exam Updated Vital Signs BP 139/81 (BP Location: Right Arm)   Pulse (!) 107   Temp 99.1 F (37.3 C) (Oral)   Resp 18   Ht 5\' 6"  (1.676 m)   Wt 59 kg   SpO2 100%   BMI 20.98 kg/m   Physical Exam Vitals and nursing note reviewed.  Constitutional:      General: He is not in acute distress.    Appearance: Normal appearance. He is normal weight. He is not ill-appearing, toxic-appearing or diaphoretic.     Comments: Well-developed but cachectic adult male.  Sitting upright and speaking clearly.  Appears to be in pain.  Hiccuping throughout the exam.  HENT:     Head: Normocephalic and atraumatic.     Right Ear: External ear normal.     Left Ear: External ear normal.     Nose: Nose normal.     Mouth/Throat:     Mouth: Mucous membranes are moist.     Pharynx: Oropharynx is clear. No oropharyngeal exudate or posterior oropharyngeal erythema.  Eyes:     General: No scleral icterus.       Right eye: No discharge.        Left eye: No discharge.     Extraocular Movements: Extraocular movements intact.     Conjunctiva/sclera: Conjunctivae normal.  Cardiovascular:     Rate and Rhythm: Normal rate and regular rhythm.     Pulses: Normal pulses.     Heart sounds: Normal heart sounds. No murmur heard.   No friction rub. No gallop.  Pulmonary:     Effort: Pulmonary effort is normal. No respiratory distress.     Breath sounds: Normal breath sounds. No stridor. No wheezing, rhonchi or rales.  Abdominal:     General: Abdomen is flat.     Palpations: Abdomen is soft.     Tenderness: There is abdominal tenderness.     Comments: Abdomen is flat and soft.  Moderate tenderness noted along the epigastrium.  Musculoskeletal:        General: Normal range of motion.     Cervical back: Normal range of motion and neck supple. No  tenderness.  Skin:    General: Skin is warm and dry.  Neurological:     General: No focal deficit present.     Mental Status: He is alert and oriented to person, place, and time.  Psychiatric:        Mood and Affect: Mood normal.        Behavior: Behavior normal.   ED Results / Procedures / Treatments   Labs (all labs ordered are listed, but only abnormal results are displayed) Labs Reviewed  COMPREHENSIVE METABOLIC PANEL - Abnormal; Notable for  the following components:      Result Value   Potassium 3.4 (*)    Chloride 97 (*)    Glucose, Bld 268 (*)    BUN 23 (*)    Creatinine, Ser 1.48 (*)    Total Protein 8.3 (*)    Anion gap 16 (*)    All other components within normal limits  CBC - Abnormal; Notable for the following components:   WBC 11.8 (*)    MCV 77.1 (*)    MCH 25.8 (*)    All other components within normal limits  URINALYSIS, ROUTINE W REFLEX MICROSCOPIC - Abnormal; Notable for the following components:   Glucose, UA >=500 (*)    Hgb urine dipstick LARGE (*)    Ketones, ur 15 (*)    Protein, ur >300 (*)    All other components within normal limits  URINALYSIS, MICROSCOPIC (REFLEX) - Abnormal; Notable for the following components:   Bacteria, UA FEW (*)    All other components within normal limits  CBG MONITORING, ED - Abnormal; Notable for the following components:   Glucose-Capillary 260 (*)    All other components within normal limits  LIPASE, BLOOD    EKG None  Radiology No results found.  Procedures Procedures   Medications Ordered in ED Medications  sodium chloride 0.9 % bolus 1,000 mL (0 mLs Intravenous Stopped 05/24/21 1215)  metoCLOPramide (REGLAN) injection 10 mg (10 mg Intravenous Given 05/24/21 1111)  fentaNYL (SUBLIMAZE) injection 50 mcg (50 mcg Intravenous Given 05/24/21 1108)  potassium chloride SA (KLOR-CON) CR tablet 20 mEq (20 mEq Oral Given 05/24/21 1226)  fentaNYL (SUBLIMAZE) injection 25 mcg (25 mcg Intravenous Given 05/24/21 1241)   ondansetron (ZOFRAN) injection 4 mg (4 mg Intravenous Given 05/24/21 1255)    ED Course  I have reviewed the triage vital signs and the nursing notes.  Pertinent labs & imaging results that were available during my care of the patient were reviewed by me and considered in my medical decision making (see chart for details).  Clinical Course as of 05/24/21 1350  Tue May 24, 2021  1253 Protein(!): >300 [LJ]  1311 Ketones, ur(!): 15 [LJ]  1311 Hgb urine dipstick(!): LARGE [LJ]    Clinical Course User Index [LJ] Placido SouJoldersma, Morelia Cassells, PA-C   MDM Rules/Calculators/A&P                          Pt is a 25 y.o. male who presents to the ED due to non-intractable nausea and vomiting as well as epigastric pain.  History of gastroparesis as well as type 1 diabetes mellitus.  States his symptoms are consistent with prior gastroparesis exacerbations.  Labs: See see with a white count of 11.8, MCV of 77.1, MCH of 25.8. CMP with a potassium of 3.4, chloride of 97, glucose of 268, BUN of 23, creatinine 1.48, total protein of 8.3, anion gap of 16. Lipase of 24. CBG of 260.   UA with glucosuria greater than 500, large hemoglobin, 15 ketones, greater than 300 protein, few bacteria.  I, Placido SouLogan Courtenay Hirth, PA-C, personally reviewed and evaluated these images and lab results as part of my medical decision-making.  Lab work is generally reassuring.  Mild leukocytosis that is likely reactive.  Elevated creatinine of 1.48 but a GFR greater than 60.  Mild anion gap at 16 but CO2 within normal limits at 22.  UA with 15 ketones.  Doubt DKA at this time.   Patient given IV fentanyl,  Reglan, Zofran, as well as 1 L of IV fluids.  States he is feeling significantly better.  P.o. challenged successfully.  Feel the patient is stable for discharge at this time and he is agreeable.  He has p.o. Reglan at home but does not have any ODT medications.  We will provide additional ODT Zofran.  Recommended PCP as well as GI  follow-up.  Given strict return precautions.  His questions were answered and he was amicable at the time of discharge.  Patient discussed with my attending physician who was in agreement with the above plan.  Note: Portions of this report may have been transcribed using voice recognition software. Every effort was made to ensure accuracy; however, inadvertent computerized transcription errors may be present.   Final Clinical Impression(s) / ED Diagnoses Final diagnoses:  Epigastric pain  Non-intractable vomiting with nausea, unspecified vomiting type    Rx / DC Orders ED Discharge Orders          Ordered    ondansetron (ZOFRAN ODT) 4 MG disintegrating tablet  Every 8 hours PRN        05/24/21 1348             Placido Sou, PA-C 05/24/21 1351    [place], Adam, DO 05/24/21 1406

## 2021-05-27 ENCOUNTER — Other Ambulatory Visit: Payer: Self-pay

## 2021-05-27 ENCOUNTER — Emergency Department (HOSPITAL_BASED_OUTPATIENT_CLINIC_OR_DEPARTMENT_OTHER)
Admission: EM | Admit: 2021-05-27 | Discharge: 2021-05-28 | Discharge status: Against Medical Advice | Payer: BLUE CROSS/BLUE SHIELD | Attending: Emergency Medicine | Admitting: Emergency Medicine

## 2021-05-27 ENCOUNTER — Encounter (HOSPITAL_BASED_OUTPATIENT_CLINIC_OR_DEPARTMENT_OTHER): Payer: Self-pay | Admitting: *Deleted

## 2021-05-27 DIAGNOSIS — N179 Acute kidney failure, unspecified: Secondary | ICD-10-CM | POA: Insufficient documentation

## 2021-05-27 DIAGNOSIS — E1010 Type 1 diabetes mellitus with ketoacidosis without coma: Secondary | ICD-10-CM | POA: Insufficient documentation

## 2021-05-27 DIAGNOSIS — R000 Tachycardia, unspecified: Secondary | ICD-10-CM | POA: Diagnosis not present

## 2021-05-27 DIAGNOSIS — Z794 Long term (current) use of insulin: Secondary | ICD-10-CM | POA: Diagnosis not present

## 2021-05-27 DIAGNOSIS — R112 Nausea with vomiting, unspecified: Secondary | ICD-10-CM | POA: Diagnosis present

## 2021-05-27 DIAGNOSIS — Z79899 Other long term (current) drug therapy: Secondary | ICD-10-CM | POA: Diagnosis not present

## 2021-05-27 DIAGNOSIS — Z20822 Contact with and (suspected) exposure to covid-19: Secondary | ICD-10-CM | POA: Insufficient documentation

## 2021-05-27 DIAGNOSIS — E1310 Other specified diabetes mellitus with ketoacidosis without coma: Secondary | ICD-10-CM

## 2021-05-27 DIAGNOSIS — E1110 Type 2 diabetes mellitus with ketoacidosis without coma: Secondary | ICD-10-CM | POA: Diagnosis present

## 2021-05-27 LAB — CBC WITH DIFFERENTIAL/PLATELET
Abs Immature Granulocytes: 0.05 10*3/uL (ref 0.00–0.07)
Basophils Absolute: 0 10*3/uL (ref 0.0–0.1)
Basophils Relative: 0 %
Eosinophils Absolute: 0 10*3/uL (ref 0.0–0.5)
Eosinophils Relative: 0 %
HCT: 44.5 % (ref 39.0–52.0)
Hemoglobin: 14.8 g/dL (ref 13.0–17.0)
Immature Granulocytes: 0 %
Lymphocytes Relative: 7 %
Lymphs Abs: 0.9 10*3/uL (ref 0.7–4.0)
MCH: 25.6 pg — ABNORMAL LOW (ref 26.0–34.0)
MCHC: 33.3 g/dL (ref 30.0–36.0)
MCV: 76.9 fL — ABNORMAL LOW (ref 80.0–100.0)
Monocytes Absolute: 1.2 10*3/uL — ABNORMAL HIGH (ref 0.1–1.0)
Monocytes Relative: 9 %
Neutro Abs: 10.9 10*3/uL — ABNORMAL HIGH (ref 1.7–7.7)
Neutrophils Relative %: 84 %
Platelets: 210 10*3/uL (ref 150–400)
RBC: 5.79 MIL/uL (ref 4.22–5.81)
RDW: 13.6 % (ref 11.5–15.5)
WBC: 13.1 10*3/uL — ABNORMAL HIGH (ref 4.0–10.5)
nRBC: 0 % (ref 0.0–0.2)

## 2021-05-27 LAB — I-STAT VENOUS BLOOD GAS, ED
Acid-base deficit: 4 mmol/L — ABNORMAL HIGH (ref 0.0–2.0)
Bicarbonate: 20 mmol/L (ref 20.0–28.0)
Calcium, Ion: 0.97 mmol/L — ABNORMAL LOW (ref 1.15–1.40)
HCT: 48 % (ref 39.0–52.0)
Hemoglobin: 16.3 g/dL (ref 13.0–17.0)
O2 Saturation: 85 %
Patient temperature: 99
Potassium: 4.1 mmol/L (ref 3.5–5.1)
Sodium: 128 mmol/L — ABNORMAL LOW (ref 135–145)
TCO2: 21 mmol/L — ABNORMAL LOW (ref 22–32)
pCO2, Ven: 33.4 mmHg — ABNORMAL LOW (ref 44.0–60.0)
pH, Ven: 7.387 (ref 7.250–7.430)
pO2, Ven: 50 mmHg — ABNORMAL HIGH (ref 32.0–45.0)

## 2021-05-27 LAB — RESP PANEL BY RT-PCR (FLU A&B, COVID) ARPGX2
Influenza A by PCR: NEGATIVE
Influenza B by PCR: NEGATIVE
SARS Coronavirus 2 by RT PCR: NEGATIVE

## 2021-05-27 LAB — BASIC METABOLIC PANEL
Anion gap: 29 — ABNORMAL HIGH (ref 5–15)
BUN: 37 mg/dL — ABNORMAL HIGH (ref 6–20)
CO2: 16 mmol/L — ABNORMAL LOW (ref 22–32)
Calcium: 8.2 mg/dL — ABNORMAL LOW (ref 8.9–10.3)
Chloride: 82 mmol/L — ABNORMAL LOW (ref 98–111)
Creatinine, Ser: 1.9 mg/dL — ABNORMAL HIGH (ref 0.61–1.24)
GFR, Estimated: 50 mL/min — ABNORMAL LOW (ref >60)
Glucose, Bld: 521 mg/dL (ref 70–99)
Potassium: 4.3 mmol/L (ref 3.5–5.1)
Sodium: 127 mmol/L — ABNORMAL LOW (ref 135–145)

## 2021-05-27 LAB — URINALYSIS, MICROSCOPIC (REFLEX)

## 2021-05-27 LAB — URINALYSIS, ROUTINE W REFLEX MICROSCOPIC
Bilirubin Urine: NEGATIVE
Glucose, UA: >=500 mg/dL — AB
Ketones, ur: 40 mg/dL — AB
Leukocytes,Ua: NEGATIVE
Nitrite: NEGATIVE
Protein, ur: 100 mg/dL — AB
Specific Gravity, Urine: 1.01 (ref 1.005–1.030)
pH: 5.5 (ref 5.0–8.0)

## 2021-05-27 LAB — CBG MONITORING, ED
Glucose-Capillary: 521 mg/dL (ref 70–99)
Glucose-Capillary: 434 mg/dL — ABNORMAL HIGH (ref 70–99)

## 2021-05-27 MED ORDER — LACTATED RINGERS IV SOLN: INTRAVENOUS | Status: DC

## 2021-05-27 MED ORDER — DEXTROSE 50 % IV SOLN: 0.0000 mL | INTRAVENOUS | Status: DC | PRN

## 2021-05-27 MED ORDER — DEXTROSE IN LACTATED RINGERS 5 % IV SOLN: INTRAVENOUS | Status: DC

## 2021-05-27 MED ORDER — LACTATED RINGERS IV BOLUS
20.0000 mL/kg | Freq: Once | INTRAVENOUS | Status: AC
  Administered 2021-05-27: 1180 mL via INTRAVENOUS

## 2021-05-27 MED ORDER — SODIUM CHLORIDE 0.9 % IV BOLUS
1000.0000 mL | Freq: Once | INTRAVENOUS | Status: AC
  Administered 2021-05-27: 1000 mL via INTRAVENOUS

## 2021-05-27 MED ORDER — POTASSIUM CHLORIDE 10 MEQ/100ML IV SOLN
10.0000 meq | INTRAVENOUS | Status: AC
  Administered 2021-05-27 (×2): 10 meq via INTRAVENOUS
  Filled 2021-05-27 (×2): qty 100

## 2021-05-27 MED ORDER — INSULIN REGULAR(HUMAN) IN NACL 100-0.9 UT/100ML-% IV SOLN
INTRAVENOUS | Status: DC
Start: 2021-05-27 — End: 2021-05-28
  Administered 2021-05-27: 6 [IU]/h via INTRAVENOUS
  Filled 2021-05-27: qty 100

## 2021-05-27 MED ORDER — HALOPERIDOL LACTATE 5 MG/ML IJ SOLN
1.0000 mg | Freq: Once | INTRAMUSCULAR | Status: AC
  Administered 2021-05-27: 1 mg via INTRAVENOUS
  Filled 2021-05-27: qty 1

## 2021-05-27 NOTE — ED Provider Notes (Signed)
[facility] EMERGENCY DEPARTMENT Provider Note   CSN: [id number] Arrival date & time: 05/27/21  2035     History Chief Complaint  Patient presents with   Vomiting    Anthony Graham is a 25 y.o. male with a history of type 1 diabetes, gastroparesis, presenting the ED with dehydration and abdominal pain.  The patient was seen in the ED 3 days ago for similar presentation.  He was given IV fluids with some improvement.  He returns today reporting that his symptoms have recurred within the past 2 days.  He reports epigastric discomfort similar to his gastroparesis.  He reports nausea and vomiting with eating.  He does smoke marijuana regularly and last use this approximately 2 days ago.  He also reports loose bowel movements today.  HPI     Past Medical History:  Diagnosis Date   Diabetes mellitus without complication (HCC)    Gastroparesis     Patient Active Problem List   Diagnosis Date Noted   DKA (diabetic ketoacidosis) (HCC) 05/27/2021    Past Surgical History:  Procedure Laterality Date   EYE SURGERY         Family History  Problem Relation Age of Onset   Diabetes Mother    Diabetes Other     Social History   Tobacco Use   Smoking status: Never   Smokeless tobacco: Never  Vaping Use   Vaping Use: Never used  Substance Use Topics   Alcohol use: Not Currently    Comment: occ   Drug use: Yes    Types: Marijuana    Comment: daily since 2017    Home Medications Prior to Admission medications   Medication Sig Start Date End Date Taking? Authorizing Provider  Continuous Blood Gluc Sensor (FREESTYLE LIBRE 2 SENSOR) MISC  07/17/20   [provider]  dicyclomine (BENTYL) 20 MG tablet Take 1 tablet (20 mg total) by mouth 2 (two) times daily. 05/24/21   Mare Ferrari, PA-C  Elastic Bandages & Supports (MEDICAL COMPRESSION STOCKINGS) MISC Dispense 1 pair of knee-high compression stockings, to be used when you are continuously standing 03/26/21    Pollina, Canary Brim, MD  furosemide (LASIX) 20 MG tablet Take 1 tablet (20 mg total) by mouth daily for 3 days. 03/26/21 03/29/21  Gilda Crease, MD  gabapentin (NEURONTIN) 100 MG capsule Take by mouth. 03/17/20   [provider]  insulin aspart (NOVOLOG FLEXPEN) 100 UNIT/ML FlexPen Use as directed up to 90 units daily. Please make appt at [phone] for more refills. 01/30/17   [provider]  insulin aspart (NOVOLOG) 100 UNIT/ML injection Inject into the skin 3 (three) times daily before meals.    [provider]  insulin degludec (TRESIBA) 100 UNIT/ML SOPN FlexTouch Pen Inject into the skin. 02/25/18   [provider]  lisinopril (ZESTRIL) 10 MG tablet Take 10 mg by mouth daily. 06/25/20   [provider]  metoCLOPramide (REGLAN) 10 MG tablet Take 1 tablet (10 mg total) by mouth every 8 (eight) hours as needed for nausea or vomiting. 03/02/20   Petrucelli, Lelon Mast R, PA-C  omeprazole (PRILOSEC) 40 MG capsule Take 1 capsule (40 mg total) by mouth daily. 08/07/20   Molpus, Jonny Ruiz, MD  ondansetron (ZOFRAN ODT) 4 MG disintegrating tablet Take 1 tablet (4 mg total) by mouth every 8 (eight) hours as needed for nausea or vomiting. 05/24/21   Placido Sou, PA-C  promethazine (PHENERGAN) 25 MG suppository Place 1 suppository (25 mg total)  rectally every 6 (six) hours as needed for nausea or vomiting. 02/26/21   Robinson, [place] N, PA-C  promethazine (PHENERGAN) 25 MG tablet Take 1 tablet (25 mg total) by mouth every 6 (six) hours as needed for nausea or vomiting. 08/02/20   Liberty HandyGibbons, Claudia J, PA-C  potassium chloride SA (KLOR-CON) 20 MEQ tablet Take 1 tablet (20 mEq total) by mouth daily. 03/02/20 08/05/20  Petrucelli, Pleas KochSamantha R, PA-C    Allergies    Gramineae pollens  Review of Systems   Review of Systems  Constitutional:  Negative for chills and fever.  HENT:  Negative for ear pain and sore throat.   Respiratory:  Negative for cough and shortness  of breath.   Cardiovascular:  Negative for chest pain and palpitations.  Gastrointestinal:  Positive for abdominal pain, diarrhea, nausea and vomiting.  Musculoskeletal:  Negative for arthralgias and back pain.  Skin:  Negative for color change and rash.  Neurological:  Positive for headaches. Negative for syncope.  All other systems reviewed and are negative.  Physical Exam Updated Vital Signs BP (!) 153/104   Pulse (!) 114   Temp 99 F (37.2 C) (Oral)   Resp 15   Ht 5\' 6"  (1.676 m)   Wt 59 kg   SpO2 99%   BMI 20.98 kg/m   Physical Exam Constitutional:      General: He is not in acute distress. HENT:     Head: Normocephalic and atraumatic.  Eyes:     Conjunctiva/sclera: Conjunctivae normal.     Pupils: Pupils are equal, round, and reactive to light.  Cardiovascular:     Rate and Rhythm: Regular rhythm. Tachycardia present.     Pulses: Normal pulses.  Pulmonary:     Effort: Pulmonary effort is normal. No respiratory distress.  Abdominal:     General: There is no distension.     Tenderness: There is abdominal tenderness in the epigastric area. There is no guarding or rebound.  Skin:    General: Skin is warm and dry.  Neurological:     General: No focal deficit present.     Mental Status: He is alert. Mental status is at baseline.  Psychiatric:        Mood and Affect: Mood normal.        Behavior: Behavior normal.    ED Results / Procedures / Treatments   Labs (all labs ordered are listed, but only abnormal results are displayed) Labs Reviewed  BASIC METABOLIC PANEL - Abnormal; Notable for the following components:      Result Value   Sodium 127 (*)    Chloride 82 (*)    CO2 16 (*)    Glucose, Bld 521 (*)    BUN 37 (*)    Creatinine, Ser 1.90 (*)    Calcium 8.2 (*)    GFR, Estimated 50 (*)    Anion gap 29 (*)    All other components within normal limits  CBC WITH DIFFERENTIAL/PLATELET - Abnormal; Notable for the following components:   WBC 13.1 (*)     MCV 76.9 (*)    MCH 25.6 (*)    Neutro Abs 10.9 (*)    Monocytes Absolute 1.2 (*)    All other components within normal limits  URINALYSIS, ROUTINE W REFLEX MICROSCOPIC - Abnormal; Notable for the following components:   Color, Urine STRAW (*)    Glucose, UA >=500 (*)    Hgb urine dipstick MODERATE (*)    Ketones, ur 40 (*)  Protein, ur 100 (*)    All other components within normal limits  URINALYSIS, MICROSCOPIC (REFLEX) - Abnormal; Notable for the following components:   Bacteria, UA RARE (*)    All other components within normal limits  CBG MONITORING, ED - Abnormal; Notable for the following components:   Glucose-Capillary 521 (*)    All other components within normal limits  CBG MONITORING, ED - Abnormal; Notable for the following components:   Glucose-Capillary 434 (*)    All other components within normal limits  I-STAT VENOUS BLOOD GAS, ED - Abnormal; Notable for the following components:   pCO2, Ven 33.4 (*)    pO2, Ven 50.0 (*)    TCO2 21 (*)    Acid-base deficit 4.0 (*)    Sodium 128 (*)    Calcium, Ion 0.97 (*)    All other components within normal limits  CBG MONITORING, ED - Abnormal; Notable for the following components:   Glucose-Capillary 421 (*)    All other components within normal limits  CBG MONITORING, ED - Abnormal; Notable for the following components:   Glucose-Capillary 321 (*)    All other components within normal limits  CBG MONITORING, ED - Abnormal; Notable for the following components:   Glucose-Capillary 346 (*)    All other components within normal limits  CBG MONITORING, ED - Abnormal; Notable for the following components:   Glucose-Capillary 251 (*)    All other components within normal limits  RESP PANEL BY RT-PCR (FLU A&B, COVID) ARPGX2  BETA-HYDROXYBUTYRIC ACID    EKG None  Radiology No results found.  Procedures .Critical Care  Date/Time: 05/28/2021 10:13 AM Performed by: Terald Sleeper, MD Authorized by: Terald Sleeper, MD   Critical care provider statement:    Critical care time (minutes):  45   Critical care was necessary to treat or prevent imminent or life-threatening deterioration of the following conditions:  Endocrine crisis   Critical care was time spent personally by me on the following activities:  Discussions with consultants, evaluation of patient's response to treatment, examination of patient, ordering and performing treatments and interventions, ordering and review of laboratory studies, ordering and review of radiographic studies, pulse oximetry, re-evaluation of patient's condition, obtaining history from patient or surrogate and review of old charts   Medications Ordered in ED Medications  haloperidol lactate (HALDOL) injection 1 mg (1 mg Intravenous Given 05/27/21 2107)  sodium chloride 0.9 % bolus 1,000 mL (0 mLs Intravenous Stopped 05/27/21 2210)  lactated ringers bolus 1,180 mL (0 mL/kg  59 kg Intravenous Stopped 05/27/21 2354)  potassium chloride 10 mEq in 100 mL IVPB (0 mEq Intravenous Stopped 05/27/21 2355)    ED Course  I have reviewed the triage vital signs and the nursing notes.  Pertinent labs & imaging results that were available during my care of the patient were reviewed by me and considered in my medical decision making (see chart for details).  25 yo male here with diabetic ketosis.  I suspect this may be triggered by his gastroparesis and insulin pump malfunction.  He also reports continued marijuana usage, and there may be some cannabis hyperemesis component to this as well.  We discussed this in the room.   BS > 500 Anion gap of 29 UA with ketones & protein VBG with no acidosis BUN & Cr elevated as well consistent with dehydration  No evidence of infection at this time  - UA negative for leuks, nitrites, Covid/flu negative  IV fluids, IV insulin  ordered IV haldol for nausea IV K per protocol repletion  Stable for medical admission   Clinical Course  as of 05/28/21 1016  Fri May 27, 2021  2204 Anion gap(!): 29 [MT]  2204 Patient will need to be admitted for diabetic ketoacidosis.  I placed a consult. [MT]  2223 No acidosis  [MT]  2228 Admitted dr opyd [MT]    Clinical Course User Index [MT] Renaye Rakers Kermit Balo, MD    Final Clinical Impression(s) / ED Diagnoses Final diagnoses:  AKI (acute kidney injury) (HCC)  Diabetic ketosis without coma [facility])    Rx / DC Orders ED Discharge Orders     None        Terald Sleeper, MD 05/28/21 1016

## 2021-05-27 NOTE — ED Triage Notes (Addendum)
C/o vomiting x 1 day , pt reports 3rd visit this week for same, also reports smoking marijuana yesterday , states his blood sugar is 500 today

## 2021-05-27 NOTE — ED Notes (Signed)
Per endotool, no changes in insulin dosage

## 2021-05-28 LAB — CBG MONITORING, ED
Glucose-Capillary: 251 mg/dL — ABNORMAL HIGH (ref 70–99)
Glucose-Capillary: 321 mg/dL — ABNORMAL HIGH (ref 70–99)
Glucose-Capillary: 346 mg/dL — ABNORMAL HIGH (ref 70–99)
Glucose-Capillary: 421 mg/dL — ABNORMAL HIGH (ref 70–99)

## 2021-05-28 LAB — BETA-HYDROXYBUTYRIC ACID: Beta-Hydroxybutyric Acid: >8 mmol/L — ABNORMAL HIGH (ref 0.05–0.27)

## 2021-05-28 NOTE — ED Notes (Signed)
Pt states he feels better and wants to leave. Dr. Nicanor Alcon made aware.

## 2021-06-24 ENCOUNTER — Other Ambulatory Visit: Payer: Self-pay

## 2021-06-24 ENCOUNTER — Ambulatory Visit (HOSPITAL_COMMUNITY)
Admission: EM | Admit: 2021-06-24 | Discharge: 2021-06-24 | Disposition: A | Discharge status: Home / Self Care | Payer: BLUE CROSS/BLUE SHIELD | Attending: Student | Admitting: Student

## 2021-06-24 DIAGNOSIS — F4323 Adjustment disorder with mixed anxiety and depressed mood: Secondary | ICD-10-CM

## 2021-06-24 DIAGNOSIS — R45851 Suicidal ideations: Secondary | ICD-10-CM | POA: Diagnosis not present

## 2021-06-24 MED ORDER — HYDROXYZINE PAMOATE 25 MG PO CAPS: 25.0000 mg | ORAL_CAPSULE | Freq: Three times a day (TID) | ORAL | 0 refills | Status: DC | PRN

## 2021-06-24 NOTE — Discharge Instructions (Addendum)
Patient is instructed prior to discharge to: Take all medications as prescribed by his/her mental healthcare provider. Start taking Hydroxyzine 25 mg by mouth three times daily as needed for anxiety and insomnia.  Report any adverse effects and or reactions from the medicines to his/her outpatient provider promptly.  Patient has been instructed & cautioned: To not engage in alcohol and or illegal drug use while on prescription medicines.  In the event of worsening symptoms, patient is instructed to call the crisis hotline, 911 and or go to the nearest ED for appropriate evaluation and treatment of symptoms.  To follow-up with his/her primary care provider for your other medical issues, concerns and or health care needs.  Schedule an appointment with an outpatient psychiatric provider or your primary care provider for medication management.  Schedule an appointment with an outpatient therapist or psychologist for cognitive-behavioral therapy.

## 2021-06-24 NOTE — ED Provider Notes (Signed)
[facility] Medical Screening Exam  Patient Name: Anthony Graham MRN: [id number] Date of Evaluation: 06/24/21 Chief Complaint:   Diagnosis:  Final diagnoses:  Adjustment disorder with mixed anxiety and depressed mood  Suicidal ideation    History of Present illness: Anthony Graham is a 25 y.o. male with no psychiatric history and a medical history of T1DM and diabetic retinopathy s/p bilateral vitrectomy (R eye 3/22, L eye 7/22) who presents to the [facility] voluntarily for suicidal ideation. Anthony Graham has been having difficulties with the thought of going blind, and has always told himself that if he were to go blind, he would take his own life. He has had suicidal thoughts since 6th grade, but they have increased in intensity since his health has been declining. He has no plan as to how he would take his life, but he denies wanting to hurt himself. Another recent stressor is that he was supposed to get glasses, but is unable to get those that he needs until 08/12/21.   Anthony Graham endorses decreased sleep, energy, appetite, as well as psychomotor retardation and feeling as though he is being punished. Additionally, he endorses anxiety attacks (last one Friday), which he describes as "feeling scared, paranoid, shaking, and short of breath."  Anthony Graham said that when he was diagnosed with diabetes, he never thought that he would enjoy life again but found a way to do so. This Clinical research associate reminded him that if he did it once, he can do it again; take things day by day. He was receptive.   Collateral from mom, [facility], produced similar concerns. She is noticing the worsening depression and doesn't want Anthony Graham to hurt himself. Mom says there are no guns, sharps, or other weapons in the home that Anthony Graham would have access to. The plan was discussed with her, and she was agreeable.   Psychiatric Specialty Exam  Presentation  General Appearance:Appropriate for Environment; Casual  Eye  Contact:Fair  Speech:Clear and Coherent; Slow  Speech Volume:Decreased  Handedness:No data recorded  Mood and Affect  Mood:Depressed; Hopeless  Affect:Congruent; Depressed   Thought Process  Thought Processes:Linear  Descriptions of Associations:Intact  Orientation:Full (Time, Place and Person)  Thought Content:Logical    Hallucinations:None  Ideas of Reference:None  Suicidal Thoughts:Yes, Passive ("Doesn't want to live life blind") Without Plan; Without Means to Carry Out; Without Access to Means; With Intent  Homicidal Thoughts:No   Sensorium  Memory:Immediate Good; Recent Good; Remote Good  Judgment:Impaired  Insight:Fair   Executive Functions  Concentration:Good  Attention Span:Good  Recall:Good  Fund of Knowledge:Good  Language:Good   Psychomotor Activity  Psychomotor Activity:Normal   Assets  Assets:Communication Skills; Housing; Social Support   Sleep  Sleep:Fair  Number of hours:  No data recorded  No data recorded  Physical Exam: Physical Exam ROS Blood pressure (!) 123/94, pulse 93, temperature 98.5 F (36.9 C), temperature source Oral, resp. rate 16, SpO2 99 %. There is no height or weight on file to calculate BMI.  Musculoskeletal: Strength & Muscle Tone: within normal limits Gait & Station: normal Patient leans: N/A   [facility] MSE Discharge Disposition for Follow up and Recommendations: Based on my evaluation the patient does not appear to have an emergency medical condition and can be discharged with resources and follow up care in outpatient services for Medication Management and Individual Therapy -Start Hydroxyzine 25 mg TID PRN anxiety. -Schedule an appointment with a psychiatric provider or your primary care provider for medication management. -Schedule an appointment with a therapist or psychologist  for cognitive-behavioral therapy.   Lamar Sprinkles, MD 06/24/2021, 5:33 PM

## 2021-06-24 NOTE — [facility] Counselor (Signed)
TTS triage: Patient presents to [facility] for evaluation with his mother. He states earlier today he felt suicidal without any plan or intent so called suicide hotline and they directed him here. He denies SI/HI/AVH. He reports using THC last night but denies other SA.  Patient is routine.

## 2021-06-24 NOTE — ED Notes (Signed)
Pt discharged in no acute distress. Verbalized understanding of discharge instructions reviewed on AVS. Safety maintained. °

## 2021-08-22 DIAGNOSIS — D649 Anemia, unspecified: Secondary | ICD-10-CM | POA: Diagnosis present

## 2021-09-26 ENCOUNTER — Other Ambulatory Visit: Payer: Self-pay

## 2021-09-26 ENCOUNTER — Encounter (HOSPITAL_BASED_OUTPATIENT_CLINIC_OR_DEPARTMENT_OTHER): Payer: Self-pay | Admitting: Emergency Medicine

## 2021-09-26 ENCOUNTER — Emergency Department (HOSPITAL_BASED_OUTPATIENT_CLINIC_OR_DEPARTMENT_OTHER)
Admission: EM | Admit: 2021-09-26 | Discharge: 2021-09-26 | Disposition: A | Discharge status: Home / Self Care | Payer: BLUE CROSS/BLUE SHIELD | Attending: Emergency Medicine | Admitting: Emergency Medicine

## 2021-09-26 DIAGNOSIS — E1110 Type 2 diabetes mellitus with ketoacidosis without coma: Secondary | ICD-10-CM | POA: Diagnosis not present

## 2021-09-26 DIAGNOSIS — Z794 Long term (current) use of insulin: Secondary | ICD-10-CM | POA: Insufficient documentation

## 2021-09-26 DIAGNOSIS — K3184 Gastroparesis: Secondary | ICD-10-CM | POA: Insufficient documentation

## 2021-09-26 DIAGNOSIS — R112 Nausea with vomiting, unspecified: Secondary | ICD-10-CM

## 2021-09-26 DIAGNOSIS — R1084 Generalized abdominal pain: Secondary | ICD-10-CM | POA: Diagnosis present

## 2021-09-26 DIAGNOSIS — E1143 Type 2 diabetes mellitus with diabetic autonomic (poly)neuropathy: Secondary | ICD-10-CM | POA: Insufficient documentation

## 2021-09-26 LAB — CBC WITH DIFFERENTIAL/PLATELET
Abs Immature Granulocytes: 0.04 10*3/uL (ref 0.00–0.07)
Basophils Absolute: 0 10*3/uL (ref 0.0–0.1)
Basophils Relative: 0 %
Eosinophils Absolute: 0 10*3/uL (ref 0.0–0.5)
Eosinophils Relative: 0 %
HCT: 40.2 % (ref 39.0–52.0)
Hemoglobin: 13.3 g/dL (ref 13.0–17.0)
Immature Granulocytes: 0 %
Lymphocytes Relative: 8 %
Lymphs Abs: 0.9 10*3/uL (ref 0.7–4.0)
MCH: 25.8 pg — ABNORMAL LOW (ref 26.0–34.0)
MCHC: 33.1 g/dL (ref 30.0–36.0)
MCV: 78.1 fL — ABNORMAL LOW (ref 80.0–100.0)
Monocytes Absolute: 0.4 10*3/uL (ref 0.1–1.0)
Monocytes Relative: 4 %
Neutro Abs: 9.3 10*3/uL — ABNORMAL HIGH (ref 1.7–7.7)
Neutrophils Relative %: 88 %
Platelets: 183 10*3/uL (ref 150–400)
RBC: 5.15 MIL/uL (ref 4.22–5.81)
RDW: 13.8 % (ref 11.5–15.5)
WBC: 10.6 10*3/uL — ABNORMAL HIGH (ref 4.0–10.5)
nRBC: 0 % (ref 0.0–0.2)

## 2021-09-26 LAB — COMPREHENSIVE METABOLIC PANEL
ALT: 33 U/L (ref 0–44)
AST: 33 U/L (ref 15–41)
Albumin: 4.4 g/dL (ref 3.5–5.0)
Alkaline Phosphatase: 90 U/L (ref 38–126)
Anion gap: 13 (ref 5–15)
BUN: 20 mg/dL (ref 6–20)
CO2: 23 mmol/L (ref 22–32)
Calcium: 9.2 mg/dL (ref 8.9–10.3)
Chloride: 102 mmol/L (ref 98–111)
Creatinine, Ser: 1.35 mg/dL — ABNORMAL HIGH (ref 0.61–1.24)
GFR, Estimated: >60 mL/min (ref >60)
Glucose, Bld: 309 mg/dL — ABNORMAL HIGH (ref 70–99)
Potassium: 4 mmol/L (ref 3.5–5.1)
Sodium: 138 mmol/L (ref 135–145)
Total Bilirubin: 1.2 mg/dL (ref 0.3–1.2)
Total Protein: 7.7 g/dL (ref 6.5–8.1)

## 2021-09-26 LAB — URINALYSIS, ROUTINE W REFLEX MICROSCOPIC
Bilirubin Urine: NEGATIVE
Glucose, UA: >=500 mg/dL — AB
Ketones, ur: 40 mg/dL — AB
Leukocytes,Ua: NEGATIVE
Nitrite: NEGATIVE
Protein, ur: >300 mg/dL — AB
Specific Gravity, Urine: 1.02 (ref 1.005–1.030)
pH: 5.5 (ref 5.0–8.0)

## 2021-09-26 LAB — URINALYSIS, MICROSCOPIC (REFLEX)

## 2021-09-26 LAB — LIPASE, BLOOD: Lipase: 23 U/L (ref 11–51)

## 2021-09-26 MED ORDER — HALOPERIDOL LACTATE 5 MG/ML IJ SOLN
4.0000 mg | Freq: Once | INTRAMUSCULAR | Status: AC
  Administered 2021-09-26: 4 mg via INTRAVENOUS
  Filled 2021-09-26: qty 1

## 2021-09-26 MED ORDER — LACTATED RINGERS IV BOLUS
1000.0000 mL | Freq: Once | INTRAVENOUS | Status: AC
  Administered 2021-09-26: 1000 mL via INTRAVENOUS

## 2021-09-26 MED ORDER — ONDANSETRON 4 MG PO TBDP: 4.0000 mg | ORAL_TABLET | Freq: Three times a day (TID) | ORAL | 0 refills | Status: DC | PRN

## 2021-09-26 NOTE — ED Triage Notes (Signed)
Pt arrives pov with c/o abdominal pain, hx of gastroparesis. Reports N/V/D since 0500 today. Inability to hold down PO meds

## 2021-09-26 NOTE — ED Provider Notes (Signed)
[facility] EMERGENCY DEPARTMENT Provider Note   CSN: [id number] Arrival date & time: 09/26/21  1535     History Chief Complaint  Patient presents with   Abdominal Pain    Anthony Graham is a 25 y.o. male.  HPI     25 year old male with a history of diabetes and gastroparesis presents with concern for abdominal pain, nausea and vomiting.  Reports he has had episodic nausea and vomiting since 2020, but episode today began at 5 AM.  Reports he is had numerous, more than he can count episodes of nausea and vomiting today, as well as frequent diarrhea.  Denies any black or bloody stools.  Reports diffuse abdominal pain that feels similar to when he had gastroparesis in the past.  Denies any fever, urinary symptoms, cough, sore throat, chest pain.  Reports that when he was vomiting frequently he felt some shortness of breath.  Denies taking ibuprofen.  Reports that he does smoke marijuana, and it last did yesterday.  Believes he may have had cannabinoid hyperemesis 1 time in the past.  Past Medical History:  Diagnosis Date   Diabetes mellitus without complication (HCC)    Gastroparesis     Patient Active Problem List   Diagnosis Date Noted   DKA (diabetic ketoacidosis) (HCC) 05/27/2021    Past Surgical History:  Procedure Laterality Date   EYE SURGERY         Family History  Problem Relation Age of Onset   Diabetes Mother    Diabetes Other     Social History   Tobacco Use   Smoking status: Never   Smokeless tobacco: Never  Vaping Use   Vaping Use: Never used  Substance Use Topics   Alcohol use: Not Currently    Comment: occ   Drug use: Yes    Types: Marijuana    Comment: daily since 2017    Home Medications Prior to Admission medications   Medication Sig Start Date End Date Taking? Authorizing Provider  ondansetron (ZOFRAN ODT) 4 MG disintegrating tablet Take 1 tablet (4 mg total) by mouth every 8 (eight) hours as needed for nausea or vomiting.  09/26/21  Yes Alvira Monday, MD  Continuous Blood Gluc Sensor (FREESTYLE LIBRE 2 SENSOR) MISC  07/17/20   [provider]  dicyclomine (BENTYL) 20 MG tablet Take 1 tablet (20 mg total) by mouth 2 (two) times daily. 05/24/21   Mare Ferrari, PA-C  Elastic Bandages & Supports (MEDICAL COMPRESSION STOCKINGS) MISC Dispense 1 pair of knee-high compression stockings, to be used when you are continuously standing 03/26/21   Pollina, Canary Brim, MD  furosemide (LASIX) 20 MG tablet Take 1 tablet (20 mg total) by mouth daily for 3 days. 03/26/21 03/29/21  Gilda Crease, MD  gabapentin (NEURONTIN) 100 MG capsule Take by mouth. 03/17/20   [provider]  hydrOXYzine (VISTARIL) 25 MG capsule Take 1 capsule (25 mg total) by mouth 3 (three) times daily as needed for anxiety. 06/24/21   Lamar Sprinkles, MD  insulin aspart (NOVOLOG FLEXPEN) 100 UNIT/ML FlexPen Use as directed up to 90 units daily. Please make appt at [phone] for more refills. 01/30/17   [provider]  insulin aspart (NOVOLOG) 100 UNIT/ML injection Inject into the skin 3 (three) times daily before meals.    [provider]  insulin degludec (TRESIBA) 100 UNIT/ML SOPN FlexTouch Pen Inject into the skin. 02/25/18   [provider]  lisinopril (ZESTRIL) 10 MG tablet Take 10 mg by  mouth daily. 06/25/20   [provider]  metoCLOPramide (REGLAN) 10 MG tablet Take 1 tablet (10 mg total) by mouth every 8 (eight) hours as needed for nausea or vomiting. 03/02/20   Petrucelli, Lelon Mast R, PA-C  omeprazole (PRILOSEC) 40 MG capsule Take 1 capsule (40 mg total) by mouth daily. 08/07/20   Molpus, Jonny Ruiz, MD  promethazine (PHENERGAN) 25 MG suppository Place 1 suppository (25 mg total) rectally every 6 (six) hours as needed for nausea or vomiting. 02/26/21   Robinson, [place] N, PA-C  promethazine (PHENERGAN) 25 MG tablet Take 1 tablet (25 mg total) by mouth every 6 (six) hours as needed for nausea or  vomiting. 08/02/20   Liberty Handy, PA-C  potassium chloride SA (KLOR-CON) 20 MEQ tablet Take 1 tablet (20 mEq total) by mouth daily. 03/02/20 08/05/20  Petrucelli, Pleas Koch, PA-C    Allergies    Gramineae pollens  Review of Systems   Review of Systems  Constitutional:  Negative for fever.  HENT:  Negative for sore throat.   Eyes:  Negative for visual disturbance.  Respiratory:  Positive for shortness of breath.   Cardiovascular:  Negative for chest pain.  Gastrointestinal:  Positive for abdominal pain, diarrhea, nausea and vomiting.  Genitourinary:  Negative for difficulty urinating.  Musculoskeletal:  Negative for back pain and neck stiffness.  Skin:  Negative for rash.  Neurological:  Positive for light-headedness. Negative for syncope and headaches.   Physical Exam Updated Vital Signs BP (!) 113/97 (BP Location: Left Arm)   Pulse (!) 113   Temp 98 F (36.7 C) (Oral)   Resp 15   Ht 5\' 6"  (1.676 m)   Wt 61.2 kg   SpO2 97%   BMI 21.79 kg/m   Physical Exam Vitals and nursing note reviewed.  Constitutional:      General: He is not in acute distress.    Appearance: He is well-developed. He is not diaphoretic.  HENT:     Head: Normocephalic and atraumatic.  Eyes:     Conjunctiva/sclera: Conjunctivae normal.  Cardiovascular:     Rate and Rhythm: Normal rate and regular rhythm.     Heart sounds: Normal heart sounds. No murmur heard.   No friction rub. No gallop.  Pulmonary:     Effort: Pulmonary effort is normal. No respiratory distress.     Breath sounds: Normal breath sounds. No wheezing or rales.  Abdominal:     General: There is no distension.     Palpations: Abdomen is soft.     Tenderness: There is no abdominal tenderness. There is no guarding.  Musculoskeletal:     Cervical back: Normal range of motion.  Skin:    General: Skin is warm and dry.  Neurological:     Mental Status: He is alert and oriented to person, place, and time.    ED Results /  Procedures / Treatments   Labs (all labs ordered are listed, but only abnormal results are displayed) Labs Reviewed  COMPREHENSIVE METABOLIC PANEL - Abnormal; Notable for the following components:      Result Value   Glucose, Bld 309 (*)    Creatinine, Ser 1.35 (*)    All other components within normal limits  CBC WITH DIFFERENTIAL/PLATELET - Abnormal; Notable for the following components:   WBC 10.6 (*)    MCV 78.1 (*)    MCH 25.8 (*)    Neutro Abs 9.3 (*)    All other components within normal limits  URINALYSIS, ROUTINE W REFLEX  MICROSCOPIC - Abnormal; Notable for the following components:   Glucose, UA >=500 (*)    Hgb urine dipstick MODERATE (*)    Ketones, ur 40 (*)    Protein, ur >300 (*)    All other components within normal limits  URINALYSIS, MICROSCOPIC (REFLEX) - Abnormal; Notable for the following components:   Bacteria, UA RARE (*)    All other components within normal limits  LIPASE, BLOOD    EKG None  Radiology No results found.  Procedures Procedures   Medications Ordered in ED Medications  lactated ringers bolus 1,000 mL (0 mLs Intravenous Stopped 09/26/21 2016)  haloperidol lactate (HALDOL) injection 4 mg (4 mg Intravenous Given 09/26/21 1900)    ED Course  I have reviewed the triage vital signs and the nursing notes.  Pertinent labs & imaging results that were available during my care of the patient were reviewed by me and considered in my medical decision making (see chart for details).    MDM Rules/Calculators/A&P                            25 year old male with a history of diabetes and gastroparesis presents with concern for abdominal pain, nausea and vomiting.  Labs obtained show no DKA, pancreatitis, hepatitis, urinary tract infection.  Has some ketones in urine, which may be secondary to starvation ketosis, or mild ketonuria related to diabetes, but labs do not show any other sign of acidosis.  Exam and history are not consistent with  small bowel obstruction, diverticulitis, appendicitis or cholecystitis.  Suspect likely gastroparesis related to diabetes or possible c hyperemesis.  He was given Haldol, was able to tolerate fluids by mouth and reported improvement. Patient discharged in stable condition with understanding of reasons to return.    Final Clinical Impression(s) / ED Diagnoses Final diagnoses:  Gastroparesis  Nausea and vomiting, unspecified vomiting type    Rx / DC Orders ED Discharge Orders          Ordered    ondansetron (ZOFRAN ODT) 4 MG disintegrating tablet  Every 8 hours PRN        09/26/21 2035             Alvira Monday, MD 09/27/21 [phone]

## 2021-12-07 ENCOUNTER — Emergency Department (HOSPITAL_BASED_OUTPATIENT_CLINIC_OR_DEPARTMENT_OTHER): Payer: BLUE CROSS/BLUE SHIELD

## 2021-12-07 ENCOUNTER — Other Ambulatory Visit: Payer: Self-pay

## 2021-12-07 ENCOUNTER — Inpatient Hospital Stay (HOSPITAL_BASED_OUTPATIENT_CLINIC_OR_DEPARTMENT_OTHER)
Admission: EM | Admit: 2021-12-07 | Discharge: 2021-12-10 | DRG: 073 | Disposition: A | Discharge status: Home / Self Care | Payer: BLUE CROSS/BLUE SHIELD | Attending: Student | Admitting: Student

## 2021-12-07 ENCOUNTER — Encounter (HOSPITAL_BASED_OUTPATIENT_CLINIC_OR_DEPARTMENT_OTHER): Payer: Self-pay | Admitting: *Deleted

## 2021-12-07 DIAGNOSIS — R112 Nausea with vomiting, unspecified: Secondary | ICD-10-CM | POA: Diagnosis not present

## 2021-12-07 DIAGNOSIS — K219 Gastro-esophageal reflux disease without esophagitis: Secondary | ICD-10-CM | POA: Diagnosis present

## 2021-12-07 DIAGNOSIS — M6282 Rhabdomyolysis: Secondary | ICD-10-CM | POA: Diagnosis present

## 2021-12-07 DIAGNOSIS — Z79899 Other long term (current) drug therapy: Secondary | ICD-10-CM

## 2021-12-07 DIAGNOSIS — Z9109 Other allergy status, other than to drugs and biological substances: Secondary | ICD-10-CM

## 2021-12-07 DIAGNOSIS — K3184 Gastroparesis: Secondary | ICD-10-CM | POA: Diagnosis present

## 2021-12-07 DIAGNOSIS — E1042 Type 1 diabetes mellitus with diabetic polyneuropathy: Secondary | ICD-10-CM

## 2021-12-07 DIAGNOSIS — N170 Acute kidney failure with tubular necrosis: Secondary | ICD-10-CM | POA: Diagnosis present

## 2021-12-07 DIAGNOSIS — Z794 Long term (current) use of insulin: Secondary | ICD-10-CM

## 2021-12-07 DIAGNOSIS — N179 Acute kidney failure, unspecified: Secondary | ICD-10-CM | POA: Diagnosis present

## 2021-12-07 DIAGNOSIS — N184 Chronic kidney disease, stage 4 (severe): Secondary | ICD-10-CM | POA: Diagnosis present

## 2021-12-07 DIAGNOSIS — E1043 Type 1 diabetes mellitus with diabetic autonomic (poly)neuropathy: Secondary | ICD-10-CM | POA: Diagnosis not present

## 2021-12-07 DIAGNOSIS — Z9641 Presence of insulin pump (external) (internal): Secondary | ICD-10-CM | POA: Diagnosis present

## 2021-12-07 DIAGNOSIS — R809 Proteinuria, unspecified: Secondary | ICD-10-CM | POA: Diagnosis present

## 2021-12-07 DIAGNOSIS — Z20822 Contact with and (suspected) exposure to covid-19: Secondary | ICD-10-CM | POA: Diagnosis present

## 2021-12-07 DIAGNOSIS — D72829 Elevated white blood cell count, unspecified: Secondary | ICD-10-CM | POA: Diagnosis present

## 2021-12-07 DIAGNOSIS — E860 Dehydration: Secondary | ICD-10-CM | POA: Diagnosis present

## 2021-12-07 DIAGNOSIS — Z833 Family history of diabetes mellitus: Secondary | ICD-10-CM

## 2021-12-07 DIAGNOSIS — I10 Essential (primary) hypertension: Secondary | ICD-10-CM | POA: Diagnosis present

## 2021-12-07 DIAGNOSIS — F1290 Cannabis use, unspecified, uncomplicated: Secondary | ICD-10-CM | POA: Diagnosis present

## 2021-12-07 DIAGNOSIS — E119 Type 2 diabetes mellitus without complications: Secondary | ICD-10-CM

## 2021-12-07 HISTORY — DX: Essential (primary) hypertension: I10

## 2021-12-07 LAB — I-STAT VENOUS BLOOD GAS, ED
Acid-Base Excess: 5 mmol/L — ABNORMAL HIGH (ref 0.0–2.0)
Bicarbonate: 28.7 mmol/L — ABNORMAL HIGH (ref 20.0–28.0)
Calcium, Ion: 1.02 mmol/L — ABNORMAL LOW (ref 1.15–1.40)
HCT: 45 % (ref 39.0–52.0)
Hemoglobin: 15.3 g/dL (ref 13.0–17.0)
O2 Saturation: 93 %
Patient temperature: 98.4
Potassium: 3.5 mmol/L (ref 3.5–5.1)
Sodium: 140 mmol/L (ref 135–145)
TCO2: 30 mmol/L (ref 22–32)
pCO2, Ven: 39.6 mmHg — ABNORMAL LOW (ref 44.0–60.0)
pH, Ven: 7.468 — ABNORMAL HIGH (ref 7.250–7.430)
pO2, Ven: 63 mmHg — ABNORMAL HIGH (ref 32.0–45.0)

## 2021-12-07 LAB — COMPREHENSIVE METABOLIC PANEL
ALT: 31 U/L (ref 0–44)
AST: 55 U/L — ABNORMAL HIGH (ref 15–41)
Albumin: 4.3 g/dL (ref 3.5–5.0)
Alkaline Phosphatase: 97 U/L (ref 38–126)
Anion gap: 20 — ABNORMAL HIGH (ref 5–15)
BUN: 52 mg/dL — ABNORMAL HIGH (ref 6–20)
CO2: 25 mmol/L (ref 22–32)
Calcium: 8.8 mg/dL — ABNORMAL LOW (ref 8.9–10.3)
Chloride: 95 mmol/L — ABNORMAL LOW (ref 98–111)
Creatinine, Ser: 2.35 mg/dL — ABNORMAL HIGH (ref 0.61–1.24)
GFR, Estimated: 39 mL/min — ABNORMAL LOW (ref >60)
Glucose, Bld: 181 mg/dL — ABNORMAL HIGH (ref 70–99)
Potassium: 3.7 mmol/L (ref 3.5–5.1)
Sodium: 140 mmol/L (ref 135–145)
Total Bilirubin: 1.1 mg/dL (ref 0.3–1.2)
Total Protein: 8.1 g/dL (ref 6.5–8.1)

## 2021-12-07 LAB — RESP PANEL BY RT-PCR (FLU A&B, COVID) ARPGX2
Influenza A by PCR: NEGATIVE
Influenza B by PCR: NEGATIVE
SARS Coronavirus 2 by RT PCR: NEGATIVE

## 2021-12-07 LAB — URINALYSIS, MICROSCOPIC (REFLEX)

## 2021-12-07 LAB — URINALYSIS, ROUTINE W REFLEX MICROSCOPIC
Bilirubin Urine: NEGATIVE
Glucose, UA: >=500 mg/dL — AB
Ketones, ur: NEGATIVE mg/dL
Leukocytes,Ua: NEGATIVE
Nitrite: NEGATIVE
Protein, ur: >300 mg/dL — AB
Specific Gravity, Urine: >1.03 — ABNORMAL HIGH (ref 1.005–1.030)
pH: 5.5 (ref 5.0–8.0)

## 2021-12-07 LAB — CBC
HCT: 43.9 % (ref 39.0–52.0)
Hemoglobin: 14.6 g/dL (ref 13.0–17.0)
MCH: 25.4 pg — ABNORMAL LOW (ref 26.0–34.0)
MCHC: 33.3 g/dL (ref 30.0–36.0)
MCV: 76.3 fL — ABNORMAL LOW (ref 80.0–100.0)
Platelets: 269 10*3/uL (ref 150–400)
RBC: 5.75 MIL/uL (ref 4.22–5.81)
RDW: 13.7 % (ref 11.5–15.5)
WBC: 17.3 10*3/uL — ABNORMAL HIGH (ref 4.0–10.5)
nRBC: 0 % (ref 0.0–0.2)

## 2021-12-07 LAB — LIPASE, BLOOD: Lipase: 23 U/L (ref 11–51)

## 2021-12-07 MED ORDER — MORPHINE SULFATE (PF) 4 MG/ML IV SOLN
4.0000 mg | Freq: Once | INTRAVENOUS | Status: AC
  Administered 2021-12-07: 21:00:00 4 mg via INTRAVENOUS
  Filled 2021-12-07: qty 1

## 2021-12-07 MED ORDER — HYDROMORPHONE HCL 1 MG/ML IJ SOLN
1.0000 mg | Freq: Once | INTRAMUSCULAR | Status: AC
  Administered 2021-12-07: 23:00:00 1 mg via INTRAVENOUS
  Filled 2021-12-07: qty 1

## 2021-12-07 MED ORDER — SODIUM CHLORIDE 0.9 % IV BOLUS
1000.0000 mL | Freq: Once | INTRAVENOUS | Status: AC
  Administered 2021-12-07: 23:00:00 1000 mL via INTRAVENOUS

## 2021-12-07 MED ORDER — METOCLOPRAMIDE HCL 5 MG/ML IJ SOLN
10.0000 mg | Freq: Once | INTRAMUSCULAR | Status: AC
  Administered 2021-12-07: 23:00:00 10 mg via INTRAVENOUS
  Filled 2021-12-07: qty 2

## 2021-12-07 MED ORDER — DIPHENHYDRAMINE HCL 50 MG/ML IJ SOLN
12.5000 mg | Freq: Once | INTRAMUSCULAR | Status: AC
  Administered 2021-12-07: 22:00:00 12.5 mg via INTRAVENOUS
  Filled 2021-12-07: qty 1

## 2021-12-07 MED ORDER — ONDANSETRON HCL 4 MG/2ML IJ SOLN
4.0000 mg | Freq: Once | INTRAMUSCULAR | Status: AC
  Administered 2021-12-07: 21:00:00 4 mg via INTRAVENOUS
  Filled 2021-12-07: qty 2

## 2021-12-07 MED ORDER — SODIUM CHLORIDE 0.9 % IV BOLUS
1000.0000 mL | Freq: Once | INTRAVENOUS | Status: AC
  Administered 2021-12-07: 21:00:00 1000 mL via INTRAVENOUS

## 2021-12-07 NOTE — ED Triage Notes (Addendum)
C/o vomiting/ abd pain HX gastroparesis and marijuana use  x 2 days

## 2021-12-07 NOTE — ED Notes (Signed)
Patient transported to CT 

## 2021-12-07 NOTE — ED Provider Notes (Signed)
[facility] EMERGENCY DEPARTMENT Provider Note   CSN: [id number] Arrival date & time: 12/07/21  1501     History Chief Complaint  Patient presents with   Vomiting    Anthony Graham is a 25 y.o. male.  HPI  25 year old male with past medical history of DM, previous DKA, gastroparesis presents emergency department with complaint of abdominal pain, vomiting for the past 2 days.  Patient states this feels similar to his previous gastroparesis episodes however he is complaining of worse than baseline abdominal pain, midepigastric.  Denies any fever or chills.  No genitourinary symptoms.  Denies any diarrhea/bloody stools.  Still uses marijuana.   Past Medical History:  Diagnosis Date   Diabetes mellitus without complication ([place])    Gastroparesis     Patient Active Problem List   Diagnosis Date Noted   DKA (diabetic ketoacidosis) ([place]) 05/27/2021    Past Surgical History:  Procedure Laterality Date   EYE SURGERY         Family History  Problem Relation Age of Onset   Diabetes Mother    Diabetes Other     Social History   Tobacco Use   Smoking status: Never   Smokeless tobacco: Never  Vaping Use   Vaping Use: Never used  Substance Use Topics   Alcohol use: Not Currently    Comment: occ   Drug use: Yes    Types: Marijuana    Comment: daily since 2017    Home Medications Prior to Admission medications   Medication Sig Start Date End Date Taking? Authorizing Provider  Insulin Disposable Pump (OMNIPOD DASH PODS, GEN 4,) MISC CHANGE POD EVERY 72 HOURS 07/19/21  Yes [provider]  Continuous Blood Gluc Sensor (FREESTYLE LIBRE 2 SENSOR) MISC  07/17/20   [provider]  dicyclomine (BENTYL) 20 MG tablet Take 1 tablet (20 mg total) by mouth 2 (two) times daily. 05/24/21   Garald Balding, PA-C  Elastic Bandages & Supports (MEDICAL COMPRESSION STOCKINGS) MISC Dispense 1 pair of knee-high compression stockings, to be used when you are  continuously standing 03/26/21   Pollina, Gwenyth Allegra, MD  furosemide (LASIX) 20 MG tablet Take 1 tablet (20 mg total) by mouth daily for 3 days. 03/26/21 03/29/21  Orpah Greek, MD  gabapentin (NEURONTIN) 100 MG capsule Take by mouth. 03/17/20   [provider]  hydrOXYzine (VISTARIL) 25 MG capsule Take 1 capsule (25 mg total) by mouth 3 (three) times daily as needed for anxiety. 06/24/21   Rosezetta Schlatter, MD  insulin aspart (NOVOLOG FLEXPEN) 100 UNIT/ML FlexPen Use as directed up to 90 units daily. Please make appt at [phone] for more refills. 01/30/17   [provider]  insulin aspart (NOVOLOG) 100 UNIT/ML injection Inject into the skin 3 (three) times daily before meals.    [provider]  lisinopril (ZESTRIL) 10 MG tablet Take 10 mg by mouth daily. 06/25/20   [provider]  metoCLOPramide (REGLAN) 10 MG tablet Take 1 tablet (10 mg total) by mouth every 8 (eight) hours as needed for nausea or vomiting. 03/02/20   Petrucelli, Aldona Bar R, PA-C  omeprazole (PRILOSEC) 40 MG capsule Take 1 capsule (40 mg total) by mouth daily. 08/07/20   Molpus, John, MD  ondansetron (ZOFRAN ODT) 4 MG disintegrating tablet Take 1 tablet (4 mg total) by mouth every 8 (eight) hours as needed for nausea or vomiting. 09/26/21   Gareth Morgan, MD  promethazine (PHENERGAN) 25 MG suppository Place 1 suppository (25  mg total) rectally every 6 (six) hours as needed for nausea or vomiting. 02/26/21   Robinson, [place] N, PA-C  promethazine (PHENERGAN) 25 MG tablet Take 1 tablet (25 mg total) by mouth every 6 (six) hours as needed for nausea or vomiting. 08/02/20   Kinnie Feil, PA-C  potassium chloride SA (KLOR-CON) 20 MEQ tablet Take 1 tablet (20 mEq total) by mouth daily. 03/02/20 08/05/20  Petrucelli, Glynda Jaeger, PA-C    Allergies    Gramineae pollens  Review of Systems   Review of Systems  Constitutional:  Positive for appetite change and fatigue. Negative for chills  and fever.  HENT:  Negative for congestion.   Eyes:  Negative for visual disturbance.  Respiratory:  Negative for shortness of breath.   Cardiovascular:  Negative for chest pain.  Gastrointestinal:  Positive for abdominal pain, nausea and vomiting. Negative for blood in stool and diarrhea.  Genitourinary:  Negative for dysuria and flank pain.  Skin:  Negative for rash.  Neurological:  Negative for light-headedness and headaches.   Physical Exam Updated Vital Signs BP (!) 171/130    Pulse (!) 128    Temp 98.6 F (37 C) (Oral)    Resp 19    Ht 5\' 6"  (1.676 m)    Wt 61.2 kg    SpO2 99%    BMI 21.79 kg/m   Physical Exam Vitals and nursing note reviewed.  Constitutional:      General: He is not in acute distress.    Appearance: Normal appearance.  HENT:     Head: Normocephalic.     Mouth/Throat:     Mouth: Mucous membranes are moist.  Cardiovascular:     Rate and Rhythm: Tachycardia present.  Pulmonary:     Effort: Pulmonary effort is normal. No respiratory distress.  Abdominal:     General: Abdomen is flat.     Palpations: Abdomen is soft.     Tenderness: There is abdominal tenderness. There is no guarding or rebound.  Skin:    General: Skin is warm.  Neurological:     Mental Status: He is alert and oriented to person, place, and time. Mental status is at baseline.  Psychiatric:        Mood and Affect: Mood normal.    ED Results / Procedures / Treatments   Labs (all labs ordered are listed, but only abnormal results are displayed) Labs Reviewed  COMPREHENSIVE METABOLIC PANEL - Abnormal; Notable for the following components:      Result Value   Chloride 95 (*)    Glucose, Bld 181 (*)    BUN 52 (*)    Creatinine, Ser 2.35 (*)    Calcium 8.8 (*)    AST 55 (*)    GFR, Estimated 39 (*)    Anion gap 20 (*)    All other components within normal limits  CBC - Abnormal; Notable for the following components:   WBC 17.3 (*)    MCV 76.3 (*)    MCH 25.4 (*)    All other  components within normal limits  URINALYSIS, ROUTINE W REFLEX MICROSCOPIC - Abnormal; Notable for the following components:   Color, Urine AMBER (*)    APPearance CLOUDY (*)    Specific Gravity, Urine >1.030 (*)    Glucose, UA >=500 (*)    Hgb urine dipstick LARGE (*)    Protein, ur >300 (*)    All other components within normal limits  URINALYSIS, MICROSCOPIC (REFLEX) - Abnormal; Notable for the  following components:   Bacteria, UA MANY (*)    All other components within normal limits  I-STAT VENOUS BLOOD GAS, ED - Abnormal; Notable for the following components:   pH, Ven 7.468 (*)    pCO2, Ven 39.6 (*)    pO2, Ven 63.0 (*)    Bicarbonate 28.7 (*)    Acid-Base Excess 5.0 (*)    Calcium, Ion 1.02 (*)    All other components within normal limits  RESP PANEL BY RT-PCR (FLU A&B, COVID) ARPGX2  LIPASE, BLOOD    EKG None  Radiology No results found.  Procedures .Critical Care Performed by: Rozelle Logan, DO Authorized by: Rozelle Logan, DO   Critical care provider statement:    Critical care time (minutes):  30   Critical care time was exclusive of:  Separately billable procedures and treating other patients   Critical care was necessary to treat or prevent imminent or life-threatening deterioration of the following conditions:  Cardiac failure   Critical care was time spent personally by me on the following activities:  Development of treatment plan with patient or surrogate, discussions with consultants, evaluation of patient's response to treatment, examination of patient, ordering and review of laboratory studies, ordering and review of radiographic studies, ordering and performing treatments and interventions, pulse oximetry, re-evaluation of patient's condition and review of old charts   I assumed direction of critical care for this patient from another provider in my specialty: no     Care discussed with: admitting provider     Medications Ordered in  ED Medications  sodium chloride 0.9 % bolus 1,000 mL (1,000 mLs Intravenous New Bag/Given 12/07/21 2105)  morphine 4 MG/ML injection 4 mg (4 mg Intravenous Given 12/07/21 2105)  ondansetron (ZOFRAN) injection 4 mg (4 mg Intravenous Given 12/07/21 2105)    ED Course  I have reviewed the triage vital signs and the nursing notes.  Pertinent labs & imaging results that were available during my care of the patient were reviewed by me and considered in my medical decision making (see chart for details).    MDM Rules/Calculators/A&P                          25 year old male with past medical history of gastroparesis presents emergency department with abdominal pain, nausea/vomiting.  Similar to his previous episodes of gastroparesis however more severe abdominal pain than before.  He is tachycardic on arrival, flat abdomen that is diffusely tender, voluntary guarding but not peritoneal.  Patient's blood work shows a leukocytosis, higher than his baseline with a new renal failure.  No significant acidosis, hyperglycemia or other indications of DKA.  Patient's being hydrated with IV medication.  CT the abdomen pelvis identifies no acute abdominal pathology.  Patient continues to not be able to tolerate p.o.  In the setting of renal failure will admit for IV hydration and further treatment.  Patients evaluation and results requires admission for further treatment and care. Patient agrees with admission plan, offers no new complaints and is stable/unchanged at time of admit.     Final Clinical Impression(s) / ED Diagnoses Final diagnoses:  None    Rx / DC Orders ED Discharge Orders     None        Rozelle Logan, DO 12/07/21 2221

## 2021-12-08 ENCOUNTER — Encounter (HOSPITAL_COMMUNITY): Payer: Self-pay | Admitting: Internal Medicine

## 2021-12-08 DIAGNOSIS — E119 Type 2 diabetes mellitus without complications: Secondary | ICD-10-CM

## 2021-12-08 DIAGNOSIS — E1042 Type 1 diabetes mellitus with diabetic polyneuropathy: Secondary | ICD-10-CM | POA: Diagnosis not present

## 2021-12-08 DIAGNOSIS — E1143 Type 2 diabetes mellitus with diabetic autonomic (poly)neuropathy: Secondary | ICD-10-CM | POA: Diagnosis not present

## 2021-12-08 DIAGNOSIS — E1043 Type 1 diabetes mellitus with diabetic autonomic (poly)neuropathy: Secondary | ICD-10-CM | POA: Diagnosis present

## 2021-12-08 DIAGNOSIS — Z79899 Other long term (current) drug therapy: Secondary | ICD-10-CM | POA: Diagnosis not present

## 2021-12-08 DIAGNOSIS — N179 Acute kidney failure, unspecified: Secondary | ICD-10-CM | POA: Diagnosis present

## 2021-12-08 DIAGNOSIS — E860 Dehydration: Secondary | ICD-10-CM | POA: Diagnosis present

## 2021-12-08 DIAGNOSIS — R7401 Elevation of levels of liver transaminase levels: Secondary | ICD-10-CM

## 2021-12-08 DIAGNOSIS — K3184 Gastroparesis: Secondary | ICD-10-CM

## 2021-12-08 DIAGNOSIS — K219 Gastro-esophageal reflux disease without esophagitis: Secondary | ICD-10-CM | POA: Diagnosis present

## 2021-12-08 DIAGNOSIS — D72829 Elevated white blood cell count, unspecified: Secondary | ICD-10-CM | POA: Diagnosis present

## 2021-12-08 DIAGNOSIS — Z20822 Contact with and (suspected) exposure to covid-19: Secondary | ICD-10-CM | POA: Diagnosis present

## 2021-12-08 DIAGNOSIS — I10 Essential (primary) hypertension: Secondary | ICD-10-CM | POA: Diagnosis present

## 2021-12-08 DIAGNOSIS — R809 Proteinuria, unspecified: Secondary | ICD-10-CM | POA: Diagnosis present

## 2021-12-08 DIAGNOSIS — R7989 Other specified abnormal findings of blood chemistry: Secondary | ICD-10-CM | POA: Diagnosis not present

## 2021-12-08 DIAGNOSIS — F1290 Cannabis use, unspecified, uncomplicated: Secondary | ICD-10-CM

## 2021-12-08 DIAGNOSIS — Z9109 Other allergy status, other than to drugs and biological substances: Secondary | ICD-10-CM | POA: Diagnosis not present

## 2021-12-08 DIAGNOSIS — Z833 Family history of diabetes mellitus: Secondary | ICD-10-CM | POA: Diagnosis not present

## 2021-12-08 DIAGNOSIS — R8271 Bacteriuria: Secondary | ICD-10-CM

## 2021-12-08 DIAGNOSIS — R112 Nausea with vomiting, unspecified: Secondary | ICD-10-CM | POA: Diagnosis present

## 2021-12-08 DIAGNOSIS — M6282 Rhabdomyolysis: Secondary | ICD-10-CM | POA: Diagnosis present

## 2021-12-08 DIAGNOSIS — E10649 Type 1 diabetes mellitus with hypoglycemia without coma: Secondary | ICD-10-CM | POA: Diagnosis not present

## 2021-12-08 DIAGNOSIS — Z9641 Presence of insulin pump (external) (internal): Secondary | ICD-10-CM | POA: Diagnosis present

## 2021-12-08 DIAGNOSIS — N170 Acute kidney failure with tubular necrosis: Secondary | ICD-10-CM | POA: Diagnosis present

## 2021-12-08 DIAGNOSIS — N184 Chronic kidney disease, stage 4 (severe): Secondary | ICD-10-CM | POA: Diagnosis present

## 2021-12-08 DIAGNOSIS — Z794 Long term (current) use of insulin: Secondary | ICD-10-CM | POA: Diagnosis not present

## 2021-12-08 LAB — RAPID URINE DRUG SCREEN, HOSP PERFORMED
Amphetamines: NOT DETECTED
Barbiturates: NOT DETECTED
Benzodiazepines: NOT DETECTED
Cocaine: NOT DETECTED
Opiates: POSITIVE — AB
Tetrahydrocannabinol: POSITIVE — AB

## 2021-12-08 LAB — COMPREHENSIVE METABOLIC PANEL
ALT: 32 U/L (ref 0–44)
AST: 57 U/L — ABNORMAL HIGH (ref 15–41)
Albumin: 3.9 g/dL (ref 3.5–5.0)
Alkaline Phosphatase: 85 U/L (ref 38–126)
Anion gap: 13 (ref 5–15)
BUN: 44 mg/dL — ABNORMAL HIGH (ref 6–20)
CO2: 28 mmol/L (ref 22–32)
Calcium: 8.4 mg/dL — ABNORMAL LOW (ref 8.9–10.3)
Chloride: 102 mmol/L (ref 98–111)
Creatinine, Ser: 1.96 mg/dL — ABNORMAL HIGH (ref 0.61–1.24)
GFR, Estimated: 48 mL/min — ABNORMAL LOW (ref >60)
Glucose, Bld: 145 mg/dL — ABNORMAL HIGH (ref 70–99)
Potassium: 3.8 mmol/L (ref 3.5–5.1)
Sodium: 143 mmol/L (ref 135–145)
Total Bilirubin: 0.9 mg/dL (ref 0.3–1.2)
Total Protein: 7.4 g/dL (ref 6.5–8.1)

## 2021-12-08 LAB — HEMOGLOBIN A1C
Hgb A1c MFr Bld: 10.3 % — ABNORMAL HIGH (ref 4.8–5.6)
Mean Plasma Glucose: 248.91 mg/dL

## 2021-12-08 LAB — CBC
HCT: 43.1 % (ref 39.0–52.0)
Hemoglobin: 14 g/dL (ref 13.0–17.0)
MCH: 25.4 pg — ABNORMAL LOW (ref 26.0–34.0)
MCHC: 32.5 g/dL (ref 30.0–36.0)
MCV: 78.2 fL — ABNORMAL LOW (ref 80.0–100.0)
Platelets: 227 10*3/uL (ref 150–400)
RBC: 5.51 MIL/uL (ref 4.22–5.81)
RDW: 13.8 % (ref 11.5–15.5)
WBC: 17.6 10*3/uL — ABNORMAL HIGH (ref 4.0–10.5)
nRBC: 0 % (ref 0.0–0.2)

## 2021-12-08 LAB — CBG MONITORING, ED: Glucose-Capillary: 198 mg/dL — ABNORMAL HIGH (ref 70–99)

## 2021-12-08 LAB — CK: Total CK: 3568 U/L — ABNORMAL HIGH (ref 49–397)

## 2021-12-08 LAB — HIV ANTIBODY (ROUTINE TESTING W REFLEX): HIV Screen 4th Generation wRfx: NONREACTIVE

## 2021-12-08 LAB — MAGNESIUM: Magnesium: 2.4 mg/dL (ref 1.7–2.4)

## 2021-12-08 MED ORDER — GATIFLOXACIN 0.5 % OP SOLN
1.0000 [drp] | Freq: Four times a day (QID) | OPHTHALMIC | Status: DC
  Filled 2021-12-08: qty 2.5

## 2021-12-08 MED ORDER — HYDROXYZINE HCL 25 MG PO TABS: 25.0000 mg | ORAL_TABLET | Freq: Three times a day (TID) | ORAL | Status: DC | PRN

## 2021-12-08 MED ORDER — HYDRALAZINE HCL 20 MG/ML IJ SOLN
10.0000 mg | INTRAMUSCULAR | Status: DC | PRN
  Filled 2021-12-08: qty 1

## 2021-12-08 MED ORDER — PANTOPRAZOLE SODIUM 40 MG PO TBEC
40.0000 mg | DELAYED_RELEASE_TABLET | Freq: Every day | ORAL | Status: DC
  Administered 2021-12-09 – 2021-12-10 (×2): 40 mg via ORAL
  Filled 2021-12-08 (×2): qty 1

## 2021-12-08 MED ORDER — ONDANSETRON HCL 4 MG/2ML IJ SOLN: 4.0000 mg | Freq: Four times a day (QID) | INTRAMUSCULAR | Status: DC | PRN

## 2021-12-08 MED ORDER — HYDROMORPHONE HCL 1 MG/ML IJ SOLN
0.5000 mg | INTRAMUSCULAR | Status: DC | PRN
  Administered 2021-12-08: 03:00:00 0.5 mg via INTRAVENOUS
  Filled 2021-12-08: qty 0.5

## 2021-12-08 MED ORDER — ONDANSETRON HCL 4 MG/2ML IJ SOLN
4.0000 mg | Freq: Four times a day (QID) | INTRAMUSCULAR | Status: DC | PRN
  Administered 2021-12-09: 05:00:00 4 mg via INTRAVENOUS
  Filled 2021-12-08 (×2): qty 2

## 2021-12-08 MED ORDER — ATENOLOL 25 MG PO TABS
25.0000 mg | ORAL_TABLET | Freq: Every day | ORAL | Status: DC
  Administered 2021-12-08: 04:00:00 25 mg via ORAL
  Filled 2021-12-08: qty 1

## 2021-12-08 MED ORDER — ACETAMINOPHEN 325 MG PO TABS
650.0000 mg | ORAL_TABLET | Freq: Four times a day (QID) | ORAL | Status: DC | PRN
  Administered 2021-12-09: 05:00:00 650 mg via ORAL
  Filled 2021-12-08: qty 2

## 2021-12-08 MED ORDER — METOCLOPRAMIDE HCL 5 MG PO TABS: 5.0000 mg | ORAL_TABLET | Freq: Four times a day (QID) | ORAL | Status: DC | PRN

## 2021-12-08 MED ORDER — KETOROLAC TROMETHAMINE 0.5 % OP SOLN
1.0000 [drp] | Freq: Four times a day (QID) | OPHTHALMIC | Status: DC
  Administered 2021-12-08 – 2021-12-10 (×8): 1 [drp] via OPHTHALMIC
  Filled 2021-12-08: qty 5

## 2021-12-08 MED ORDER — PANTOPRAZOLE SODIUM 40 MG IV SOLR
40.0000 mg | INTRAVENOUS | Status: DC
  Administered 2021-12-08: 05:00:00 40 mg via INTRAVENOUS
  Filled 2021-12-08: qty 40

## 2021-12-08 MED ORDER — LACTATED RINGERS IV SOLN: INTRAVENOUS | Status: DC

## 2021-12-08 MED ORDER — INSULIN PUMP
SUBCUTANEOUS | Status: DC
  Administered 2021-12-10: 4.4 via SUBCUTANEOUS
  Filled 2021-12-08: qty 1

## 2021-12-08 MED ORDER — AMLODIPINE BESYLATE 10 MG PO TABS
10.0000 mg | ORAL_TABLET | Freq: Every day | ORAL | Status: DC
  Administered 2021-12-08 – 2021-12-10 (×3): 10 mg via ORAL
  Filled 2021-12-08 (×3): qty 1

## 2021-12-08 MED ORDER — NALOXONE HCL 0.4 MG/ML IJ SOLN: 0.4000 mg | INTRAMUSCULAR | Status: DC | PRN

## 2021-12-08 MED ORDER — PREDNISOLONE ACETATE 1 % OP SUSP
1.0000 [drp] | Freq: Three times a day (TID) | OPHTHALMIC | Status: DC
  Filled 2021-12-08: qty 5

## 2021-12-08 MED ORDER — ACETAMINOPHEN 650 MG RE SUPP: 650.0000 mg | Freq: Four times a day (QID) | RECTAL | Status: DC | PRN

## 2021-12-08 MED ORDER — LORAZEPAM 2 MG/ML IJ SOLN: 0.5000 mg | Freq: Four times a day (QID) | INTRAMUSCULAR | Status: DC | PRN

## 2021-12-08 MED ORDER — PREDNISOLONE ACETATE 1 % OP SUSP
3.0000 [drp] | Freq: Every day | OPHTHALMIC | Status: DC
  Administered 2021-12-08 – 2021-12-10 (×3): 3 [drp] via OPHTHALMIC

## 2021-12-08 MED ORDER — METOCLOPRAMIDE HCL 5 MG/ML IJ SOLN
5.0000 mg | Freq: Three times a day (TID) | INTRAMUSCULAR | Status: DC
Start: 2021-12-08 — End: 2021-12-10
  Administered 2021-12-08 – 2021-12-10 (×7): 5 mg via INTRAVENOUS
  Filled 2021-12-08 (×7): qty 2

## 2021-12-08 NOTE — Progress Notes (Addendum)
PROGRESS NOTE  Anthony Graham ATF:[id number] DOB: 16-May-1996   PCP: Inez Catalina, PA-C  Patient is from: Home.  Lives with his mother.  DOA: 12/07/2021 LOS: 0  Chief complaints:  Chief Complaint  Patient presents with   Vomiting     Brief Narrative / Interim history: 25 year old M with PMH of DM-1 on insulin pump complicated by peripheral polyneuropathy and gastroparesis, HTN, GERD and marijuana use presenting with nausea, vomiting and epigastric pain and tenderness for 3 days, and admitted with intractable nausea and vomiting and AKI likely due to gastroparesis flareup.  CT abdomen and pelvis without acute finding. Cr 2.35 (baseline 1.35).  Has mildly elevated AST.   The next day, CK elevated to 3568.  Subjective: Seen and examined earlier this morning.  No major events overnight of this morning.  Nausea, vomiting and abdominal pain resolved.  Tolerating soft diet.  He denies chest pain, shortness of breath or UTI symptoms.  Patient's mother at bedside.  Objective: Vitals:   12/08/21 0309 12/08/21 0435 12/08/21 0506 12/08/21 0852  BP: (!) 184/130 (!) 187/126 (!) 155/112 (!) 193/128  Pulse: (!) 125 (!) 111 (!) 102 96  Resp: 18  15 18   Temp: 98.1 F (36.7 C)  98.4 F (36.9 C) 98.5 F (36.9 C)  TempSrc: Oral  Oral Oral  SpO2: 100%  99% 99%  Weight:      Height:        Examination:  GENERAL: No apparent distress.  Nontoxic. HEENT: MMM.  Vision and hearing grossly intact.  NECK: Supple.  No apparent JVD.  RESP: 99% on RA.  No IWOB.  Fair aeration bilaterally. CVS:  RRR. Heart sounds normal.  ABD/GI/GU: BS+. Abd soft, NTND.  MSK/EXT:  Moves extremities. No apparent deformity. No edema.  SKIN: no apparent skin lesion or wound NEURO: Awake, alert and oriented appropriately.  No apparent focal neuro deficit. PSYCH: Calm. Normal affect.   Procedures:  None  Microbiology summarized: COVID-19 and influenza PCR nonreactive.  Assessment & Plan: AKI/azotemia: Likely  prerenal insult from GI loss in the setting of intractable nausea and vomiting/gastroparesis.  Could be ATN from rhabdo as well.  UA with > 300 protein.  Has history of proteinuria.  Improving. Recent Labs    02/25/21 0340 02/26/21 1352 03/26/21 0443 05/24/21 1058 05/24/21 2235 05/27/21 2055 09/26/21 1605 12/07/21 1540 12/08/21 0427  BUN 13 18 10  23* 23* 37* 20 52* 44*  CREATININE 0.97 1.08 0.88 1.48* 1.42* 1.90* 1.35* 2.35* 1.96*  -Continue IV fluid -Continue monitoring -Check UPC  Nontraumatic rhabdomyolysis: CK elevated to 3500.  Likely from dehydration -Continue IV fluid as above  Intractable nausea/vomiting/abdominal pain-likely from flareup of his gastroparesis.  Seems to have resolved. -P.o. Reglan. -Continue Protonix-change to p.o. -Encourage marijuana cessation.  Addendum: Patient had an episode of emesis later this morning. -Changed Reglan to IV -Continue IV fluid as above  Uncontrolled DM-1 with polyneuropathy, gastroparesis and hyperglycemia: A1c 10.3%. -Continue CBG monitoring every 4 hours -Continue home insulin pump -Continue IV fluid  Uncontrolled hypertension: BP elevated. -Increase home amlodipine to 10 mg daily -Continue atenolol -Hydralazine as needed  Bacteria: Patient without UTI symptoms. -No indication for treatment.  Marijuana use: Encouraged cessation.  Recent cataract surgery-on 11/30/2021 at [facility]. -Completed course for Vigamox drop -Continue ketorolac and Pred forte eyedrops per outpatient plan. -Outpatient follow-up  Body mass index is 21.79 kg/m.         DVT prophylaxis:  SCDs Start: 12/08/21 0136  Code Status: Full code  Family Communication: Updated patient's mother at bedside. Level of care: Med-Surg Status is: Observation  The patient will require care spanning > 2 midnights and should be moved to inpatient because: Intractable nausea and vomiting, AKI and rhabdomyolysis requiring rigorous IV fluid   Final  disposition: Likely home   Consultants:  None   Sch Meds:  Scheduled Meds:  amLODipine  10 mg Oral Daily   atenolol  25 mg Oral Daily   insulin pump   Subcutaneous Q4H   ketorolac  1 drop Right Eye QID   [START ON 12/09/2021] pantoprazole  40 mg Oral Daily   prednisoLONE acetate  3 drop Right Eye Daily   Continuous Infusions:  lactated ringers 150 mL/hr at 12/08/21 0935   PRN Meds:.acetaminophen **OR** acetaminophen, hydrALAZINE, hydrOXYzine, LORazepam, metoCLOPramide, naLOXone (NARCAN)  injection  Antimicrobials: Anti-infectives (From admission, onward)    None        I have personally reviewed the following labs and images: CBC: Recent Labs  Lab 12/07/21 1540 12/07/21 2005 12/08/21 0427  WBC 17.3*  --  17.6*  HGB 14.6 15.3 14.0  HCT 43.9 45.0 43.1  MCV 76.3*  --  78.2*  PLT 269  --  227   BMP &GFR Recent Labs  Lab 12/07/21 1540 12/07/21 2005 12/08/21 0427  NA 140 140 143  K 3.7 3.5 3.8  CL 95*  --  102  CO2 25  --  28  GLUCOSE 181*  --  145*  BUN 52*  --  44*  CREATININE 2.35*  --  1.96*  CALCIUM 8.8*  --  8.4*  MG  --   --  2.4   Estimated Creatinine Clearance: 50.7 mL/min (A) (by C-G formula based on SCr of 1.96 mg/dL (H)). Liver & Pancreas: Recent Labs  Lab 12/07/21 1540 12/08/21 0427  AST 55* 57*  ALT 31 32  ALKPHOS 97 85  BILITOT 1.1 0.9  PROT 8.1 7.4  ALBUMIN 4.3 3.9   Recent Labs  Lab 12/07/21 1540  LIPASE 23   No results for input(s): AMMONIA in the last 168 hours. Diabetic: Recent Labs    12/08/21 0427  HGBA1C 10.3*   Recent Labs  Lab 12/08/21 0033  GLUCAP 198*   Cardiac Enzymes: Recent Labs  Lab 12/08/21 0427  CKTOTAL 3,568*   No results for input(s): PROBNP in the last 8760 hours. Coagulation Profile: No results for input(s): INR, PROTIME in the last 168 hours. Thyroid Function Tests: No results for input(s): TSH, T4TOTAL, FREET4, T3FREE, THYROIDAB in the last 72 hours. Lipid Profile: No results for  input(s): CHOL, HDL, LDLCALC, TRIG, CHOLHDL, LDLDIRECT in the last 72 hours. Anemia Panel: No results for input(s): VITAMINB12, FOLATE, FERRITIN, TIBC, IRON, RETICCTPCT in the last 72 hours. Urine analysis:    Component Value Date/Time   COLORURINE AMBER (A) 12/07/2021 1908   APPEARANCEUR CLOUDY (A) 12/07/2021 1908   LABSPEC >1.030 (H) 12/07/2021 1908   PHURINE 5.5 12/07/2021 1908   GLUCOSEU >=500 (A) 12/07/2021 1908   HGBUR LARGE (A) 12/07/2021 1908   BILIRUBINUR NEGATIVE 12/07/2021 1908   KETONESUR NEGATIVE 12/07/2021 1908   PROTEINUR >300 (A) 12/07/2021 1908   NITRITE NEGATIVE 12/07/2021 1908   LEUKOCYTESUR NEGATIVE 12/07/2021 1908   Sepsis Labs: Invalid input(s): PROCALCITONIN, [place]  Microbiology: Recent Results (from the past 240 hour(s))  Resp Panel by RT-PCR (Flu A&B, Covid) Nasopharyngeal Swab     Status: None   Collection Time: 12/07/21  7:53 PM   Specimen: Nasopharyngeal Swab; Nasopharyngeal(NP) swabs in  vial transport medium  Result Value Ref Range Status   SARS Coronavirus 2 by RT PCR NEGATIVE NEGATIVE Final    Comment: (NOTE) SARS-CoV-2 target nucleic acids are NOT DETECTED.  The SARS-CoV-2 RNA is generally detectable in upper respiratory specimens during the acute phase of infection. The lowest concentration of SARS-CoV-2 viral copies this assay can detect is 138 copies/mL. A negative result does not preclude SARS-Cov-2 infection and should not be used as the sole basis for treatment or other patient management decisions. A negative result may occur with  improper specimen collection/handling, submission of specimen other than nasopharyngeal swab, presence of viral mutation(s) within the areas targeted by this assay, and inadequate number of viral copies(<138 copies/mL). A negative result must be combined with clinical observations, patient history, and epidemiological information. The expected result is Negative.  Fact Sheet for Patients:   EntrepreneurPulse.com.au  Fact Sheet for Healthcare Providers:  IncredibleEmployment.be  This test is no t yet approved or cleared by the [place] FDA and  has been authorized for detection and/or diagnosis of SARS-CoV-2 by FDA under an Emergency Use Authorization (EUA). This EUA will remain  in effect (meaning this test can be used) for the duration of the COVID-19 declaration under Section 564(b)(1) of the Act, 21 U.S.C.section 360bbb-3(b)(1), unless the authorization is terminated  or revoked sooner.       Influenza A by PCR NEGATIVE NEGATIVE Final   Influenza B by PCR NEGATIVE NEGATIVE Final    Comment: (NOTE) The Xpert Xpress SARS-CoV-2/FLU/RSV plus assay is intended as an aid in the diagnosis of influenza from Nasopharyngeal swab specimens and should not be used as a sole basis for treatment. Nasal washings and aspirates are unacceptable for Xpert Xpress SARS-CoV-2/FLU/RSV testing.  Fact Sheet for Patients: EntrepreneurPulse.com.au  Fact Sheet for Healthcare Providers: IncredibleEmployment.be  This test is not yet approved or cleared by the [place] FDA and has been authorized for detection and/or diagnosis of SARS-CoV-2 by FDA under an Emergency Use Authorization (EUA). This EUA will remain in effect (meaning this test can be used) for the duration of the COVID-19 declaration under Section 564(b)(1) of the Act, 21 U.S.C. section 360bbb-3(b)(1), unless the authorization is terminated or revoked.  Performed at [facility], [address] [place], [place] [address]     Radiology Studies: CT Abdomen Pelvis Wo Contrast  Result Date: 12/07/2021 CLINICAL DATA:  Abdominal pain. EXAM: CT ABDOMEN AND PELVIS WITHOUT CONTRAST TECHNIQUE: Multidetector CT imaging of the abdomen and pelvis was performed following the standard protocol without IV contrast. COMPARISON:  None. FINDINGS:  Evaluation of this exam is limited in the absence of intravenous contrast. Lower chest: The visualized lung bases are clear. No intra-abdominal free air or free fluid. Hepatobiliary: Fatty liver. No intrahepatic biliary dilatation. The gallbladder is unremarkable. Pancreas: The pancreas is suboptimally visualized but appears somewhat atrophic for age. Spleen: Normal in size without focal abnormality. Adrenals/Urinary Tract: The adrenal glands unremarkable. The kidneys, visualized ureters, and urinary bladder appear unremarkable. Stomach/Bowel: No bowel obstruction or active inflammation. The appendix is normal. Vascular/Lymphatic: The abdominal aorta and IVC unremarkable. No portal venous gas. There is no adenopathy. Reproductive: The prostate and seminal vesicles are grossly unremarkable. No pelvic mass. Other: None Musculoskeletal: No acute or significant osseous findings. IMPRESSION: 1. No acute intra-abdominal or pelvic pathology. No bowel obstruction. Normal appendix. 2. Fatty liver. Electronically Signed   By: Anner Crete M.D.   On: 12/07/2021 21:11       Lashika Erker  [facility]  If 7PM-7AM, please contact night-coverage [email or web address] 12/08/2021, 11:58 AM

## 2021-12-08 NOTE — H&P (Signed)
History and Physical    PLEASE NOTE THAT DRAGON DICTATION SOFTWARE WAS USED IN THE CONSTRUCTION OF THIS NOTE.   Maurice Morgenroth [id number] DOB: [birth year] DOA: 12/07/2021  PCP: Robert Bellow, PA-C  Patient coming from: home   I have personally briefly reviewed patient's old medical records in [place]  Chief Complaint: Nausea vomiting  HPI: Anthony Graham is a 25 y.o. male with medical history significant for type 1 diabetes mellitus complicated by diabetic peripheral polyneuropathy, gastroparesis, essential hypertension, GERD, who is admitted to [facility] on 12/07/2021 by way of transfer from [place] ED with acute kidney injury after presenting from home to the latter facility complaining of nausea/vomiting.  The patient reports 2 to 3 days of nausea resulting in 3-4 daily episodes of nonbloody, nonbilious emesis over that timeframe, with most recent such episode occurring just prior to presenting to [place] ED today.  He notes that this is been associated with sharp epigastric discomfort worse with palpation, as well as with with the active vomiting.  Nonradiating.  Denies any preceding trauma.  Not associate with any.  Also denies any subjective fever, chills, rigors, generalized myalgias.  No rash.  Denies any recent chest pain, shortness of breath, palpitations, diaphoresis, cough.  No recent dysuria, gross hematuria.  Leukotoxin B with nausea/vomiting, patient reports that he has been unable to tolerate any p.o. over the last day, including no food or fluid over that timeframe, and is unable to tolerate his oral medications on this basis as well.  He has a history of type 1 diabetes mellitus, with documented complication of diabetic gastroparesis.  Patient reports that the above consultation symptoms are very similar to that that he is experienced at times of previous exacerbations of his gastroparesis.  He confirms that he is on an  insulin pump as an outpatient, continue changing the associated cartridges, denies any recent gaps in usage associated with the insulin pump.  Recurrent or recent alcohol consumption, reports daily smoking of marijuana over the last several years.   Per chart review, the patient has chronic leukocytosis, with baseline with blood cell count between 10.6-13.1 since June 2022.      [facility] ED Course:  Vital signs in the ED were notable for the following: Afebrile; initial heart rate 134, which decreased 114 solumedrol IV fluid administration; blood pressure 144/100; respiratory rate 16-20, oxygen saturation 96 to 100% on room air.   Labs were notable for the following: vbg notable for ph 7.46; CMP notable for the following: Bicarbonate 25, BUN 52, creatinine 2.35 relative to most recent prior value 135 in October 2022, glucose 181, BUN to creatinine ratio greater than 20, liver enzymes within normal limits.  Lipase 23.  CBC notable for 17,300 compared to his recent prior value of 10,600 in October 2022.  Urinalysis notable for 6-10 white blood cells, leukocyte Estrace/nitrate negative, no squamous cells, will demonstrating the presence of granular casts will specific gravity greater than 1.030.  COVID-19/influenza PCR negative.  Imaging and additional notable ED work-up: CT abdomen/pelvis without contrast showed no evidence of acute intra-abdominal/intrapelvic process, including no concentric cholecystitis, acute pancreatitis, bowel obstruction, abscess, or perforation.  While in the ED, the following were administered: Added 1 mg IV x1, morphine 4 mg IV x1, Reglan 10 mg IV x1, Zofran 4 mg IV x1, normal saline x2 L bolus.  Socially, the patient was transferred for admission to the med telemetry unit at [facility]  for further evaluation management of presenting acute kidney injury in the setting of dehydration and inability to tolerate p.o. at this time.      Review of Systems: As per  HPI otherwise 10 point review of systems negative.   Past Medical History:  Diagnosis Date   Diabetes mellitus without complication (HCC)    Gastroparesis     Past Surgical History:  Procedure Laterality Date   EYE SURGERY      Social History:  reports that he has never smoked. He has never used smokeless tobacco. He reports that he does not currently use alcohol. He reports current drug use. Drug: Marijuana.   Allergies  Allergen Reactions   Gramineae Pollens     Other reaction(s): Other (See Comments) "Sore throat, runny nose, sneezing"    Family History  Problem Relation Age of Onset   Diabetes Mother    Diabetes Other     Family history reviewed and not pertinent    Prior to Admission medications   Medication Sig Start Date End Date Taking? Authorizing Provider  Insulin Disposable Pump (OMNIPOD DASH PODS, GEN 4,) MISC CHANGE POD EVERY 72 HOURS 07/19/21  Yes [provider]  Continuous Blood Gluc Sensor (FREESTYLE LIBRE 2 SENSOR) MISC  07/17/20   [provider]  dicyclomine (BENTYL) 20 MG tablet Take 1 tablet (20 mg total) by mouth 2 (two) times daily. 05/24/21   Mare FerrariBelaya, Maria A, PA-C  Elastic Bandages & Supports (MEDICAL COMPRESSION STOCKINGS) MISC Dispense 1 pair of knee-high compression stockings, to be used when you are continuously standing 03/26/21   Pollina, Canary Brimhristopher J, MD  furosemide (LASIX) 20 MG tablet Take 1 tablet (20 mg total) by mouth daily for 3 days. 03/26/21 03/29/21  Gilda CreasePollina, Christopher J, MD  gabapentin (NEURONTIN) 100 MG capsule Take by mouth. 03/17/20   [provider]  hydrOXYzine (VISTARIL) 25 MG capsule Take 1 capsule (25 mg total) by mouth 3 (three) times daily as needed for anxiety. 06/24/21   Lamar Sprinklesosby, Courtney, MD  insulin aspart (NOVOLOG FLEXPEN) 100 UNIT/ML FlexPen Use as directed up to 90 units daily. Please make appt at [phone] for more refills. 01/30/17   [provider]  insulin aspart (NOVOLOG) 100 UNIT/ML  injection Inject into the skin 3 (three) times daily before meals.    [provider]  lisinopril (ZESTRIL) 10 MG tablet Take 10 mg by mouth daily. 06/25/20   [provider]  metoCLOPramide (REGLAN) 10 MG tablet Take 1 tablet (10 mg total) by mouth every 8 (eight) hours as needed for nausea or vomiting. 03/02/20   Petrucelli, Lelon MastSamantha R, PA-C  omeprazole (PRILOSEC) 40 MG capsule Take 1 capsule (40 mg total) by mouth daily. 08/07/20   Molpus, John, MD  ondansetron (ZOFRAN ODT) 4 MG disintegrating tablet Take 1 tablet (4 mg total) by mouth every 8 (eight) hours as needed for nausea or vomiting. 09/26/21   Alvira MondaySchlossman, Erin, MD  promethazine (PHENERGAN) 25 MG suppository Place 1 suppository (25 mg total) rectally every 6 (six) hours as needed for nausea or vomiting. 02/26/21   Robinson, Swazil[place]Jordan N, PA-C  promethazine (PHENERGAN) 25 MG tablet Take 1 tablet (25 mg total) by mouth every 6 (six) hours as needed for nausea or vomiting. 08/02/20   Liberty HandyGibbons, Claudia J, PA-C  potassium chloride SA (KLOR-CON) 20 MEQ tablet Take 1 tablet (20 mEq total) by mouth daily. 03/02/20 08/05/20  Petrucelli, Pleas KochSamantha R, PA-C     Objective    Physical Exam: Vitals:  12/07/21 2214 12/07/21 2330 12/08/21 0030 12/08/21 0113  BP:  (!) 145/105 (!) 180/125 (!) 156/122  Pulse: (!) 116 (!) 114 (!) 116 (!) 114  Resp: (!) 29 10 17 16   Temp:    98.4 F (36.9 C)  TempSrc:    Oral  SpO2: 96% 92% 100% 99%  Weight:      Height:        General: appears to be stated age; alert, oriented Skin: warm, dry, no rash Head:  AT/[place] Mouth:  Oral mucosa membranes appear dry, normal dentition Neck: supple; trachea midline Heart: Mildly tachycardic, regular; did not appreciate any M/R/G Lungs: CTAB, did not appreciate any wheezes, rales, or rhonchi Abdomen: + BS; soft, ND, NT Vascular: 2+ pedal pulses b/l; 2+ radial pulses b/l Extremities: no peripheral edema, no muscle wasting Neuro: strength and sensation intact in  upper and lower extremities b/l    Labs on Admission: I have personally reviewed following labs and imaging studies  CBC: Recent Labs  Lab 12/07/21 1540 12/07/21 2005  WBC 17.3*  --   HGB 14.6 15.3  HCT 43.9 45.0  MCV 76.3*  --   PLT 269  --    Basic Metabolic Panel: Recent Labs  Lab 12/07/21 1540 12/07/21 2005  NA 140 140  K 3.7 3.5  CL 95*  --   CO2 25  --   GLUCOSE 181*  --   BUN 52*  --   CREATININE 2.35*  --   CALCIUM 8.8*  --    GFR: Estimated Creatinine Clearance: 42.3 mL/min (A) (by C-G formula based on SCr of 2.35 mg/dL (H)). Liver Function Tests: Recent Labs  Lab 12/07/21 1540  AST 55*  ALT 31  ALKPHOS 97  BILITOT 1.1  PROT 8.1  ALBUMIN 4.3   Recent Labs  Lab 12/07/21 1540  LIPASE 23   No results for input(s): AMMONIA in the last 168 hours. Coagulation Profile: No results for input(s): INR, PROTIME in the last 168 hours. Cardiac Enzymes: No results for input(s): CKTOTAL, CKMB, CKMBINDEX, TROPONINI in the last 168 hours. BNP (last 3 results) No results for input(s): PROBNP in the last 8760 hours. HbA1C: No results for input(s): HGBA1C in the last 72 hours. CBG: Recent Labs  Lab 12/08/21 0033  GLUCAP 198*   Lipid Profile: No results for input(s): CHOL, HDL, LDLCALC, TRIG, CHOLHDL, LDLDIRECT in the last 72 hours. Thyroid Function Tests: No results for input(s): TSH, T4TOTAL, FREET4, T3FREE, THYROIDAB in the last 72 hours. Anemia Panel: No results for input(s): VITAMINB12, FOLATE, FERRITIN, TIBC, IRON, RETICCTPCT in the last 72 hours. Urine analysis:    Component Value Date/Time   COLORURINE AMBER (A) 12/07/2021 1908   APPEARANCEUR CLOUDY (A) 12/07/2021 1908   LABSPEC >1.030 (H) 12/07/2021 1908   PHURINE 5.5 12/07/2021 1908   GLUCOSEU >=500 (A) 12/07/2021 1908   HGBUR LARGE (A) 12/07/2021 1908   BILIRUBINUR NEGATIVE 12/07/2021 1908   KETONESUR NEGATIVE 12/07/2021 1908   PROTEINUR >300 (A) 12/07/2021 1908   NITRITE NEGATIVE  12/07/2021 1908   LEUKOCYTESUR NEGATIVE 12/07/2021 1908    Radiological Exams on Admission: CT Abdomen Pelvis Wo Contrast  Result Date: 12/07/2021 CLINICAL DATA:  Abdominal pain. EXAM: CT ABDOMEN AND PELVIS WITHOUT CONTRAST TECHNIQUE: Multidetector CT imaging of the abdomen and pelvis was performed following the standard protocol without IV contrast. COMPARISON:  None. FINDINGS: Evaluation of this exam is limited in the absence of intravenous contrast. Lower chest: The visualized lung bases are clear. No intra-abdominal free air  or free fluid. Hepatobiliary: Fatty liver. No intrahepatic biliary dilatation. The gallbladder is unremarkable. Pancreas: The pancreas is suboptimally visualized but appears somewhat atrophic for age. Spleen: Normal in size without focal abnormality. Adrenals/Urinary Tract: The adrenal glands unremarkable. The kidneys, visualized ureters, and urinary bladder appear unremarkable. Stomach/Bowel: No bowel obstruction or active inflammation. The appendix is normal. Vascular/Lymphatic: The abdominal aorta and IVC unremarkable. No portal venous gas. There is no adenopathy. Reproductive: The prostate and seminal vesicles are grossly unremarkable. No pelvic mass. Other: None Musculoskeletal: No acute or significant osseous findings. IMPRESSION: 1. No acute intra-abdominal or pelvic pathology. No bowel obstruction. Normal appendix. 2. Fatty liver. Electronically Signed   By: Elgie Collard M.D.   On: 12/07/2021 21:11     Assessment/Plan    Principal Problem:   AKI (acute kidney injury) (HCC) Active Problems:   Intractable nausea and vomiting   Dehydration   Leukocytosis   HTN (hypertension)   Diabetes mellitus (HCC)    #) Acute kidney injury: Presenting serum creatinine 2.35 relative to most recent prior value of 1.35 in October 2022.  Appears to have originated from prerenal influences as a consequence of dehydration from recurrent nausea/vomiting, with resultant decline  in oral intake.  Urinalysis suggestive of ATN, with the setting of many brown granular casts, likely as a consequence of prolonged prerenal influences, as above.  Potential pharmacologic contributions include outpatient lisinopril as well as gabapentin.  Continue, CT abdomen/pelvis showed evidence of acute process, including no evidence of postrenal obstructive source.  Plan: Lactated Ringer's at 150 cc/h.  Monitor strict I's and O's Daily weights.  Tempt avoid nephrotoxic agents.  Repeat BMP in the morning.  Hold home lisinopril and gabapentin.  Further evaluation management of presenting nausea/vomiting, as further detailed below.     #) N/V: Recurrent nausea/vomiting lasting 3 days, resulting in inability to tolerate p.o., with refractory symptoms in spite of multiple doses of IV antiemetics administered in the ED this evening.  In the setting of concomitant epigastric discomfort, differential includes consequence of known diabetic gastroparesis, with report of similar constellation of symptoms at times of previous episodes of exacerbation of his underlying gastroparesis versus contribution from hyperemesis cannabinoid syndrome given patient's reported daily marijuana smoking.  CT abdomen/pelvis shows no evidence of acute intra-abdominal process, as above, and UA shows no evidence of acute urinary tract infection, pyuria not meeting quantitative threshold for significance, particularly given concentrated specimen.  Plan: Prn IV Zofran.  As needed IV Ativan for nausea/vomiting refractory to Zofran.  Add Unser magnesium level.  Continuous LR, as above.  Monitor strict I's and O's Daily weights.  Repeat CMP/CBC in the morning.  Urinary drug screen.  Check hemoglobin A1c. Prn iv dialudid.       #) Leukocytosis: Presenting labs reflect acute on chronic psychosis, presenting by 17.3 relative to baseline range of 10.6-13.1 last 6 months, as further detailed above.  Suspect exacerbation with basis of  hemoconcentration in the setting of dehydration as well as potential element of inflammatory influence given presenting nausea/vomiting and suspected exacerbation of gastroparesis.  No clinical evidence of underlying infectious process at this time, including no evidence of UTI per UA.  Additionally, COVID-19/influenza PCR were negative.  No acute respiratory symptoms to suggest pna at this time.   Plan: Continuous LR, as above.  Monitor strict I's and O's and weights.  CBC with differential in the morning.  Further evaluation management of presenting nausea/vomiting, as above.        #) DM1:  Documented history of such, complicated by diabetic neuropathy as well as gastroparesis, as above.  Presenting blood sugar 181 consideration given to dehydration.  No evidence of anion gap metabolic acidosis, and vbg without acidemia.  In the context of recurrent gastroparesis, will assess degree of outpatient glycemic management by checking hemoglobin A1c.  Patient reports good compliance with insulin pump, confirming that insulin is currently running.  Plan: Add on A1c.  Continue existing insulin pump.  Resumption of home insulin pump order set completed.  With associated Accu-Cheks on every 4 hour basis.  IV fluids, as above.  Monitor strict I's and O's and daily weights.  Repeat BMP in the morning.  Hold home gabapentin in setting of AKI.       #) Essential hypertension: Documented history of such, on lisinopril as well as report of atenolol.  Plan: In the context of presenting AKI, will hold on lisinopril.  Resume home atenolol.  As needed IV hydralazine for systolic blood pressure greater than 180 mmHg.         #) GERD: documented h/o such; on omeprazole as outpatient.   Plan: In the setting of presenting nausea/vomiting, hold home oral omeprazole for now.  Said to have ordered Protonix 40 mg IV daily.      DVT prophylaxis: SCD's   Code Status: Full code Family Communication:  none Disposition Plan: Per Rounding Team Consults called: none;  Admission status: Observation; med telemetry   PLEASE NOTE THAT DRAGON DICTATION SOFTWARE WAS USED IN THE CONSTRUCTION OF THIS NOTE.   [place] DO [facility] [place]   12/08/2021, 1:36 AM

## 2021-12-08 NOTE — Progress Notes (Signed)
Inpatient Diabetes Program Recommendations  AACE/ADA: New Consensus Statement on Inpatient Glycemic Control (2015)  Target Ranges:  Prepandial:   less than 140 mg/dL      Peak postprandial:   less than 180 mg/dL (1-2 hours)      Critically ill patients:  140 - 180 mg/dL   Lab Results  Component Value Date   GLUCAP 198 (H) 12/08/2021   HGBA1C 10.3 (H) 12/08/2021    Review of Glycemic Control  Diabetes history: DM1 Outpatient Diabetes medications: OmniPod Current orders for Inpatient glycemic control: Insulin pump per pt  HgbA1C - 10.3% - pt states this is up from 9.6% - due to overeating since issues with his eyesight due to stress  Insulin pump settings: Basal - 0.55/H Bolus - CHO ratio 1:15 Goal 100, CF 40  Inpatient Diabetes Program Recommendations:    Agree with insulin pump use in hospital  Pt states he's had a lot of stress with his eyesight and has had recent surgery on eyes. Is unable to read. "Everything is a blur." Uses Libre CGM. Sees Endo on a regular basis.  Good blood sugar control as inpatient.  Follow daily.  Thank you. Ailene Ards, RD, LDN, CDE Inpatient Diabetes Coordinator [phone]

## 2021-12-08 NOTE — ED Notes (Signed)
Pt and visitor updated as to bed assignment status and impending transfer to [facility].  All questions addressed.  Will continue to monitor.

## 2021-12-08 NOTE — Progress Notes (Signed)
Transition of Care [facility]) Screening Note  Patient Details  Name: Anthony Graham Date of Birth: Oct 25, 1996  Transition of Care [facility]) CM/SW Contact:    Ewing Schlein, LCSW Phone Number: 12/08/2021, 11:05 AM  Transition of Care Department [facility]) has reviewed patient and no TOC needs have been identified at this time. We will continue to monitor patient advancement through interdisciplinary progression rounds. If new patient transition needs arise, please place a TOC consult.

## 2021-12-09 LAB — COMPREHENSIVE METABOLIC PANEL
ALT: 33 U/L (ref 0–44)
AST: 49 U/L — ABNORMAL HIGH (ref 15–41)
Albumin: 3.8 g/dL (ref 3.5–5.0)
Alkaline Phosphatase: 84 U/L (ref 38–126)
Anion gap: 12 (ref 5–15)
BUN: 19 mg/dL (ref 6–20)
CO2: 27 mmol/L (ref 22–32)
Calcium: 8.9 mg/dL (ref 8.9–10.3)
Chloride: 99 mmol/L (ref 98–111)
Creatinine, Ser: 1.26 mg/dL — ABNORMAL HIGH (ref 0.61–1.24)
GFR, Estimated: >60 mL/min (ref >60)
Glucose, Bld: 191 mg/dL — ABNORMAL HIGH (ref 70–99)
Potassium: 3.9 mmol/L (ref 3.5–5.1)
Sodium: 138 mmol/L (ref 135–145)
Total Bilirubin: 1.2 mg/dL (ref 0.3–1.2)
Total Protein: 7.2 g/dL (ref 6.5–8.1)

## 2021-12-09 LAB — GLUCOSE, CAPILLARY: Glucose-Capillary: 168 mg/dL — ABNORMAL HIGH (ref 70–99)

## 2021-12-09 LAB — CK: Total CK: 2772 U/L — ABNORMAL HIGH (ref 49–397)

## 2021-12-09 LAB — MAGNESIUM: Magnesium: 2.3 mg/dL (ref 1.7–2.4)

## 2021-12-09 LAB — PHOSPHORUS: Phosphorus: 2 mg/dL — ABNORMAL LOW (ref 2.5–4.6)

## 2021-12-09 MED ORDER — HYDRALAZINE HCL 25 MG PO TABS: 25.0000 mg | ORAL_TABLET | Freq: Four times a day (QID) | ORAL | Status: DC | PRN

## 2021-12-09 MED ORDER — CARVEDILOL 12.5 MG PO TABS
12.5000 mg | ORAL_TABLET | Freq: Two times a day (BID) | ORAL | Status: DC
  Administered 2021-12-09 – 2021-12-10 (×3): 12.5 mg via ORAL
  Filled 2021-12-09 (×3): qty 1

## 2021-12-09 MED ORDER — LISINOPRIL 20 MG PO TABS
20.0000 mg | ORAL_TABLET | Freq: Every day | ORAL | Status: DC
  Administered 2021-12-09 – 2021-12-10 (×2): 20 mg via ORAL
  Filled 2021-12-09 (×2): qty 1

## 2021-12-09 NOTE — Progress Notes (Signed)
PROGRESS NOTE  Anthony Graham [id number] DOB: 1996/08/17   PCP: Robert Bellow, PA-C  Patient is from: Home.  Lives with his mother.  DOA: 12/07/2021 LOS: 1  Chief complaints:  Chief Complaint  Patient presents with   Vomiting     Brief Narrative / Interim history: 25 year old M with PMH of DM-1 on insulin pump complicated by peripheral polyneuropathy and gastroparesis, HTN, GERD and marijuana use presenting with nausea, vomiting and epigastric pain and tenderness for 3 days, and admitted with intractable nausea and vomiting and AKI likely due to gastroparesis flareup.  CT abdomen and pelvis without acute finding. Cr 2.35 (baseline 1.35).  Has mildly elevated AST.   The next day, CK elevated to 3568.  Remains on IV fluid.  Also with uncontrolled hypertension  Subjective: Seen and examined earlier this morning.  No major events overnight of this morning.  No complaints.  He denies nausea, vomiting, abdominal pain, chest pain, dyspnea or UTI symptoms.  Patient's mother at bedside.  Objective: Vitals:   12/08/21 2134 12/09/21 0125 12/09/21 0459 12/09/21 1349  BP: (!) 161/119 (!) 168/116 (!) 170/117 135/86  Pulse: (!) 106 (!) 107 100 99  Resp: 16 16 16 16   Temp: 98.9 F (37.2 C) 99.2 F (37.3 C) (!) 97.5 F (36.4 C) 98 F (36.7 C)  TempSrc: Oral Oral Oral Oral  SpO2: 99% 99% 100% 99%  Weight:      Height:        Examination  GENERAL: No apparent distress.  Nontoxic. HEENT: MMM.  Vision and hearing grossly intact.  NECK: Supple.  No apparent JVD.  RESP: 99% on RA.  No IWOB.  Fair aeration bilaterally. CVS:  RRR. Heart sounds normal.  ABD/GI/GU: BS+. Abd soft, NTND.  MSK/EXT:  Moves extremities. No apparent deformity. No edema.  SKIN: no apparent skin lesion or wound NEURO: Awake and alert. Oriented appropriately.  No apparent focal neuro deficit. PSYCH: Calm. Normal affect.   Procedures:  None  Microbiology summarized: [id number] and influenza PCR  nonreactive.  Assessment & Plan: AKI/azotemia/proteinuria: Likely prerenal insult from GI loss in the setting of intractable N/V/VA/gastroparesis.  Could be ATN from rhabdo as well.  UA with > 300 protein.  Has history of proteinuria.  AKI resolved. Recent Labs    02/25/21 0340 02/26/21 1352 03/26/21 0443 05/24/21 1058 05/24/21 2235 05/27/21 2055 09/26/21 1605 12/07/21 1540 12/08/21 0427 12/09/21 0455  BUN 13 18 10  23* 23* 37* 20 52* 44* 19  CREATININE 0.97 1.08 0.88 1.48* 1.42* 1.90* 1.35* 2.35* 1.96* 1.26*  -Continue IV fluid given rhabdomyolysis -Continue monitoring -Resume home lisinopril  Nontraumatic rhabdomyolysis: CK elevated to 3500>> 2770.  Likely from dehydration -Continue IV fluid as above  Intractable nausea/vomiting/abdominal pain-likely from flareup of his gastroparesis.  Seems to have resolved. -Continue IV Reglan and IV fluid. -Continue p.o. Protonix -Encouraged marijuana cessation.  Uncontrolled DM-1 with polyneuropathy, gastroparesis and hyperglycemia: A1c 10.3%. -Continue CBG monitoring every 4 hours -Continue home insulin pump -Continue IV fluid -Appreciate help by diabetic coordinator  Uncontrolled hypertension: BP elevated but improving. -Increase home amlodipine to 10 mg daily on 12/29 -Change atenolol to Coreg -Resume home lisinopril -Change IV hydralazine to p.o. hydralazine as needed  Bacteria: Patient without UTI symptoms. -No indication for treatment.  Marijuana use: Encouraged cessation.  Recent cataract surgery-on 11/30/2021 at [facility]. -Completed course for Vigamox drop -Continue ketorolac and Pred forte eyedrops per outpatient plan. -Outpatient follow-up  Body mass index is 21.79 kg/m.  DVT prophylaxis:  SCDs Start: 12/08/21 0136  Code Status: Full code Family Communication: Updated patient's mother at bedside. Level of care: Med-Surg Status is: Inpatient  The patient will remain inpatient because:  Rhabdomyolysis requiring recurrence IV fluid and uncontrolled hypertension requiring adjustment of antihypertensive meds   Final disposition: Likely home in the next 24 to 48 hours if medically stable.   Consultants:  None   Sch Meds:  Scheduled Meds:  amLODipine  10 mg Oral Daily   carvedilol  12.5 mg Oral BID WC   insulin pump   Subcutaneous Q4H   ketorolac  1 drop Right Eye QID   lisinopril  20 mg Oral Daily   metoCLOPramide (REGLAN) injection  5 mg Intravenous TID AC & HS   pantoprazole  40 mg Oral Daily   prednisoLONE acetate  3 drop Right Eye Daily   Continuous Infusions:  lactated ringers 125 mL/hr at 12/09/21 0838   PRN Meds:.acetaminophen **OR** acetaminophen, hydrALAZINE, hydrOXYzine, LORazepam, naLOXone (NARCAN)  injection, ondansetron (ZOFRAN) IV  Antimicrobials: Anti-infectives (From admission, onward)    None        I have personally reviewed the following labs and images: CBC: Recent Labs  Lab 12/07/21 1540 12/07/21 2005 12/08/21 0427  WBC 17.3*  --  17.6*  HGB 14.6 15.3 14.0  HCT 43.9 45.0 43.1  MCV 76.3*  --  78.2*  PLT 269  --  227   BMP &GFR Recent Labs  Lab 12/07/21 1540 12/07/21 2005 12/08/21 0427 12/09/21 0455  NA 140 140 143 138  K 3.7 3.5 3.8 3.9  CL 95*  --  102 99  CO2 25  --  28 27  GLUCOSE 181*  --  145* 191*  BUN 52*  --  44* 19  CREATININE 2.35*  --  1.96* 1.26*  CALCIUM 8.8*  --  8.4* 8.9  MG  --   --  2.4 2.3  PHOS  --   --   --  2.0*   Estimated Creatinine Clearance: 78.9 mL/min (A) (by C-G formula based on SCr of 1.26 mg/dL (H)). Liver & Pancreas: Recent Labs  Lab 12/07/21 1540 12/08/21 0427 12/09/21 0455  AST 55* 57* 49*  ALT 31 32 33  ALKPHOS 97 85 84  BILITOT 1.1 0.9 1.2  PROT 8.1 7.4 7.2  ALBUMIN 4.3 3.9 3.8   Recent Labs  Lab 12/07/21 1540  LIPASE 23   No results for input(s): AMMONIA in the last 168 hours. Diabetic: Recent Labs    12/08/21 0427  HGBA1C 10.3*   Recent Labs  Lab  12/08/21 0033  GLUCAP 198*   Cardiac Enzymes: Recent Labs  Lab 12/08/21 0427 12/09/21 0455  CKTOTAL 3,568* 2,772*   No results for input(s): PROBNP in the last 8760 hours. Coagulation Profile: No results for input(s): INR, PROTIME in the last 168 hours. Thyroid Function Tests: No results for input(s): TSH, T4TOTAL, FREET4, T3FREE, THYROIDAB in the last 72 hours. Lipid Profile: No results for input(s): CHOL, HDL, LDLCALC, TRIG, CHOLHDL, LDLDIRECT in the last 72 hours. Anemia Panel: No results for input(s): VITAMINB12, FOLATE, FERRITIN, TIBC, IRON, RETICCTPCT in the last 72 hours. Urine analysis:    Component Value Date/Time   COLORURINE AMBER (A) 12/07/2021 1908   APPEARANCEUR CLOUDY (A) 12/07/2021 1908   LABSPEC >1.030 (H) 12/07/2021 1908   PHURINE 5.5 12/07/2021 1908   GLUCOSEU >=500 (A) 12/07/2021 1908   HGBUR LARGE (A) 12/07/2021 1908   BILIRUBINUR NEGATIVE 12/07/2021 1908   KETONESUR NEGATIVE 12/07/2021  1908   PROTEINUR >300 (A) 12/07/2021 1908   NITRITE NEGATIVE 12/07/2021 1908   LEUKOCYTESUR NEGATIVE 12/07/2021 1908   Sepsis Labs: Invalid input(s): PROCALCITONIN, LACTICIDVEN  Microbiology: Recent Results (from the past 240 hour(s))  Resp Panel by RT-PCR (Flu A&B, Covid) Nasopharyngeal Swab     Status: None   Collection Time: 12/07/21  7:53 PM   Specimen: Nasopharyngeal Swab; Nasopharyngeal(NP) swabs in vial transport medium  Result Value Ref Range Status   SARS Coronavirus 2 by RT PCR NEGATIVE NEGATIVE Final    Comment: (NOTE) SARS-CoV-2 target nucleic acids are NOT DETECTED.  The SARS-CoV-2 RNA is generally detectable in upper respiratory specimens during the acute phase of infection. The lowest concentration of SARS-CoV-2 viral copies this assay can detect is 138 copies/mL. A negative result does not preclude SARS-Cov-2 infection and should not be used as the sole basis for treatment or other patient management decisions. A negative result may occur with   improper specimen collection/handling, submission of specimen other than nasopharyngeal swab, presence of viral mutation(s) within the areas targeted by this assay, and inadequate number of viral copies(<138 copies/mL). A negative result must be combined with clinical observations, patient history, and epidemiological information. The expected result is Negative.  Fact Sheet for Patients:  BloggerCourse.com  Fact Sheet for Healthcare Providers:  SeriousBroker.it  This test is no t yet approved or cleared by the [place] FDA and  has been authorized for detection and/or diagnosis of SARS-CoV-2 by FDA under an Emergency Use Authorization (EUA). This EUA will remain  in effect (meaning this test can be used) for the duration of the COVID-19 declaration under Section 564(b)(1) of the Act, 21 U.S.C.section 360bbb-3(b)(1), unless the authorization is terminated  or revoked sooner.       Influenza A by PCR NEGATIVE NEGATIVE Final   Influenza B by PCR NEGATIVE NEGATIVE Final    Comment: (NOTE) The Xpert Xpress SARS-CoV-2/FLU/RSV plus assay is intended as an aid in the diagnosis of influenza from Nasopharyngeal swab specimens and should not be used as a sole basis for treatment. Nasal washings and aspirates are unacceptable for Xpert Xpress SARS-CoV-2/FLU/RSV testing.  Fact Sheet for Patients: BloggerCourse.com  Fact Sheet for Healthcare Providers: SeriousBroker.it  This test is not yet approved or cleared by the [place] FDA and has been authorized for detection and/or diagnosis of SARS-CoV-2 by FDA under an Emergency Use Authorization (EUA). This EUA will remain in effect (meaning this test can be used) for the duration of the COVID-19 declaration under Section 564(b)(1) of the Act, 21 U.S.C. section 360bbb-3(b)(1), unless the authorization is terminated  or revoked.  Performed at [facility], [address], [place], [place] [address]     Radiology Studies: No results found.     Aashvi Rezabek T. Kristoff Coonradt [facility]  If 7PM-7AM, please contact night-coverage [email or web address] 12/09/2021, 2:04 PM

## 2021-12-10 DIAGNOSIS — R7989 Other specified abnormal findings of blood chemistry: Secondary | ICD-10-CM

## 2021-12-10 DIAGNOSIS — E10649 Type 1 diabetes mellitus with hypoglycemia without coma: Secondary | ICD-10-CM

## 2021-12-10 LAB — CBC
HCT: 38.7 % — ABNORMAL LOW (ref 39.0–52.0)
Hemoglobin: 12.5 g/dL — ABNORMAL LOW (ref 13.0–17.0)
MCH: 25.5 pg — ABNORMAL LOW (ref 26.0–34.0)
MCHC: 32.3 g/dL (ref 30.0–36.0)
MCV: 79 fL — ABNORMAL LOW (ref 80.0–100.0)
Platelets: 166 10*3/uL (ref 150–400)
RBC: 4.9 MIL/uL (ref 4.22–5.81)
RDW: 13.3 % (ref 11.5–15.5)
WBC: 6.3 10*3/uL (ref 4.0–10.5)
nRBC: 0 % (ref 0.0–0.2)

## 2021-12-10 LAB — RENAL FUNCTION PANEL
Albumin: 3.2 g/dL — ABNORMAL LOW (ref 3.5–5.0)
Anion gap: 9 (ref 5–15)
BUN: 11 mg/dL (ref 6–20)
CO2: 26 mmol/L (ref 22–32)
Calcium: 8.4 mg/dL — ABNORMAL LOW (ref 8.9–10.3)
Chloride: 103 mmol/L (ref 98–111)
Creatinine, Ser: 1.12 mg/dL (ref 0.61–1.24)
GFR, Estimated: >60 mL/min (ref >60)
Glucose, Bld: 176 mg/dL — ABNORMAL HIGH (ref 70–99)
Phosphorus: 2.1 mg/dL — ABNORMAL LOW (ref 2.5–4.6)
Potassium: 3.9 mmol/L (ref 3.5–5.1)
Sodium: 138 mmol/L (ref 135–145)

## 2021-12-10 LAB — CK: Total CK: 1595 U/L — ABNORMAL HIGH (ref 49–397)

## 2021-12-10 LAB — MAGNESIUM: Magnesium: 2.1 mg/dL (ref 1.7–2.4)

## 2021-12-10 MED ORDER — CARVEDILOL 6.25 MG PO TABS: 6.2500 mg | ORAL_TABLET | Freq: Two times a day (BID) | ORAL | 1 refills | Status: DC

## 2021-12-10 MED ORDER — PANTOPRAZOLE SODIUM 40 MG PO TBEC
40.0000 mg | DELAYED_RELEASE_TABLET | Freq: Every day | ORAL | 1 refills | Status: DC
Start: 2021-12-10 — End: 2022-04-30

## 2021-12-10 MED ORDER — METOCLOPRAMIDE HCL 5 MG PO TABS: 5.0000 mg | ORAL_TABLET | Freq: Three times a day (TID) | ORAL | 0 refills | Status: DC

## 2021-12-10 MED ORDER — ONDANSETRON HCL 4 MG PO TABS: 4.0000 mg | ORAL_TABLET | Freq: Four times a day (QID) | ORAL | 1 refills | Status: DC | PRN

## 2021-12-10 MED ORDER — AMLODIPINE BESYLATE 10 MG PO TABS: 10.0000 mg | ORAL_TABLET | Freq: Every day | ORAL | 1 refills | Status: DC

## 2021-12-10 NOTE — Discharge Summary (Signed)
Physician Discharge Summary  Anthony Graham JTT:[id number] DOB: [birth year] DOA: 12/07/2021  PCP: Inez Catalina, PA-C  Admit date: 12/07/2021 Discharge date: 12/10/2021 Admitted From: Home Disposition:  Home Recommendations for Outpatient Follow-up:  Follow ups as below. Please obtain CBC/BMP/Mag at follow up Please follow up on the following pending results: None Home Health: Not indicated Equipment/Devices: Not indicated Discharge Condition: Stable  CODE STATUS: Full code  Follow-up Information     Inez Catalina, PA-C. Schedule an appointment as soon as possible for a visit in 1 week(s).   Specialty: Internal Medicine Contact information: [address] [place] [address] [phone]                Hospital Course: 25 year old M with PMH of DM-1 on insulin pump complicated by peripheral polyneuropathy and gastroparesis, HTN, GERD and marijuana use presenting with nausea, vomiting and epigastric pain and tenderness for 3 days, and admitted with intractable nausea and vomiting and AKI likely due to gastroparesis flareup.  CT abdomen and pelvis without acute finding. Cr 2.35 (baseline 1.35).  Has mildly elevated AST.    The next day, CK elevated to 3568.  Remains on IV fluid for AKI, rhabdomyolysis and intractable nausea and vomiting.  He also had uncontrolled hypertension requiring additional antihypertensive medication.  On the day of discharge, AKI resolved.  Rhabdomyolysis improved.  CK down to 1500.  He tolerated soft diet.  No further emesis.  Nausea well controlled with Reglan.  He is discharged on Reglan 5 mg 3 times daily before meals.  Also started on p.o. Protonix.   See individual problem list below for more on hospital course.  Discharge Diagnoses:  AKI/azotemia/proteinuria: Likely prerenal insult from GI loss in the setting of intractable N/V/VA/gastroparesis. Could be ATN from rhabdo as well.  UA with > 300 protein.  Has history of  proteinuria.  AKI and azotemia resolved. Recent Labs    02/26/21 1352 03/26/21 0443 05/24/21 1058 05/24/21 2235 05/27/21 2055 09/26/21 1605 12/07/21 1540 12/08/21 0427 12/09/21 0455 12/10/21 0459  BUN 18 10 23* 23* 37* 20 52* 44* 19 11  CREATININE 1.08 0.88 1.48* 1.42* 1.90* 1.35* 2.35* 1.96* 1.26* 1.12  -Recheck renal function in 1 week   Nontraumatic rhabdomyolysis: CK elevated to 3500>> 2770> 1500 likely from dehydration -Encouraged hydration   Intractable nausea/vomiting/abdominal pain-likely from flareup of his gastroparesis.  Seems to have resolved. -P.o. Reglan 5 mg 3 times daily before meals and p.o. Zofran as needed -Continue p.o. Protonix 40 mg daily -Encouraged marijuana cessation.   Uncontrolled DM-1 with polyneuropathy, gastroparesis and hyperglycemia: A1c 10.3%. -Continue home insulin pump   Uncontrolled hypertension: BP elevated but improving. -Increased home amlodipine from 2.5 mg to 10 mg daily -Added Coreg 6.25 mg twice daily -Continue home lisinopril at 20 mg daily -Reassess BP and adjust antihypertensive meds as appropriate   Bacteria: Patient without UTI symptoms. -No indication for treatment.   Marijuana use: Encouraged cessation.   Recent cataract surgery-on 11/30/2021 at [facility]. -Continue eyedrops and outpatient follow-up as previously planned   Body mass index is 20.71 kg/m.           Discharge Exam: Vitals:   12/09/21 0459 12/09/21 1349 12/09/21 2134 12/10/21 0601  BP: (!) 170/117 135/86 (!) 140/96 (!) 160/108  Pulse: 100 99 89 97  Temp: (!) 97.5 F (36.4 C) 98 F (36.7 C) 97.9 F (36.6 C) 99 F (37.2 C)  Resp: 16 16 18 16   Height:  Weight:    58.2 kg  SpO2: 100% 99% 100% 100%  TempSrc: Oral Oral Oral Oral  BMI (Calculated):    20.72     GENERAL: No apparent distress.  Nontoxic. HEENT: MMM.  Vision and hearing grossly intact.  NECK: Supple.  No apparent JVD.  RESP:  No IWOB.  Fair aeration bilaterally. CVS:   RRR. Heart sounds normal.  ABD/GI/GU: Bowel sounds present. Soft. Non tender.  MSK/EXT:  Moves extremities. No apparent deformity. No edema.  SKIN: no apparent skin lesion or wound NEURO: Awake and alert.  Oriented appropriately.  No apparent focal neuro deficit. PSYCH: Calm. Normal affect.   Discharge Instructions  Discharge Instructions     Call MD for:  difficulty breathing, headache or visual disturbances   Complete by: As directed    Call MD for:  extreme fatigue   Complete by: As directed    Call MD for:  persistant dizziness or light-headedness   Complete by: As directed    Call MD for:  persistant nausea and vomiting   Complete by: As directed    Diet - low sodium heart healthy   Complete by: As directed    Diet Carb Modified   Complete by: As directed    Discharge instructions   Complete by: As directed    It has been a pleasure taking care of you!  You were hospitalized with nausea, vomiting, rhabdomyolysis (muscle cell breakdown) and acute kidney injury likely due to gastroparesis.  Your symptoms improved with hydration and nausea medication.  Continue using Reglan before meals.  You may also use Zofran as needed for nausea and vomiting.  We have adjusted your blood pressure medication and started you on new blood pressure medication.  Please review your new medication list and the directions on your medications before you take them.  Keep yourself hydrated.  Check your blood pressure as we discussed, and keep blood pressure log.  Do not take blood pressure medication if your blood pressure is below 90/60.  Follow-up with your primary care doctor and/your nephrologist in 1 to 2 weeks.    Take care,   Increase activity slowly   Complete by: As directed       Allergies as of 12/10/2021       Reactions   Apple Itching   Banana Itching   Gramineae Pollens    Other reaction(s): Other (See Comments) "Sore throat, runny nose, sneezing"        Medication List      STOP taking these medications    moxifloxacin 0.5 % ophthalmic solution Commonly known as: VIGAMOX       TAKE these medications    amLODipine 10 MG tablet Commonly known as: NORVASC Take 1 tablet (10 mg total) by mouth daily. What changed:  medication strength how much to take   Baqsimi One Pack 3 MG/DOSE Powd Generic drug: Glucagon Place 1 spray into the nose as needed.   carvedilol 6.25 MG tablet Commonly known as: COREG Take 1 tablet (6.25 mg total) by mouth 2 (two) times daily with a meal.   ergocalciferol 1.25 MG (50000 UT) capsule Commonly known as: VITAMIN D2 Take 50,000 Units by mouth once a week. Friday's   insulin aspart 100 UNIT/ML FlexPen Commonly known as: NOVOLOG Inject 90 Units into the skin continuous. Via insulin pump   ketorolac 0.5 % ophthalmic solution Commonly known as: ACULAR Place 1 drop into the right eye 4 (four) times daily.   lisinopril 20 MG tablet  Commonly known as: ZESTRIL Take 20 mg by mouth daily.   Medical Compression Stockings Misc Dispense 1 pair of knee-high compression stockings, to be used when you are continuously standing   metoCLOPramide 5 MG tablet Commonly known as: Reglan Take 1 tablet (5 mg total) by mouth 3 (three) times daily before meals.   ondansetron 4 MG tablet Commonly known as: Zofran Take 1 tablet (4 mg total) by mouth every 6 (six) hours as needed for nausea or vomiting.   pantoprazole 40 MG tablet Commonly known as: PROTONIX Take 1 tablet (40 mg total) by mouth daily.   prednisoLONE acetate 1 % ophthalmic suspension Commonly known as: PRED FORTE Place 1 drop into the right eye in the morning, at noon, and at bedtime.        Consultations: None  Procedures/Studies:   CT Abdomen Pelvis Wo Contrast  Result Date: 12/07/2021 CLINICAL DATA:  Abdominal pain. EXAM: CT ABDOMEN AND PELVIS WITHOUT CONTRAST TECHNIQUE: Multidetector CT imaging of the abdomen and pelvis was performed following the  standard protocol without IV contrast. COMPARISON:  None. FINDINGS: Evaluation of this exam is limited in the absence of intravenous contrast. Lower chest: The visualized lung bases are clear. No intra-abdominal free air or free fluid. Hepatobiliary: Fatty liver. No intrahepatic biliary dilatation. The gallbladder is unremarkable. Pancreas: The pancreas is suboptimally visualized but appears somewhat atrophic for age. Spleen: Normal in size without focal abnormality. Adrenals/Urinary Tract: The adrenal glands unremarkable. The kidneys, visualized ureters, and urinary bladder appear unremarkable. Stomach/Bowel: No bowel obstruction or active inflammation. The appendix is normal. Vascular/Lymphatic: The abdominal aorta and IVC unremarkable. No portal venous gas. There is no adenopathy. Reproductive: The prostate and seminal vesicles are grossly unremarkable. No pelvic mass. Other: None Musculoskeletal: No acute or significant osseous findings. IMPRESSION: 1. No acute intra-abdominal or pelvic pathology. No bowel obstruction. Normal appendix. 2. Fatty liver. Electronically Signed   By: Anner Crete M.D.   On: 12/07/2021 21:11       The results of significant diagnostics from this hospitalization (including imaging, microbiology, ancillary and laboratory) are listed below for reference.     Microbiology: Recent Results (from the past 240 hour(s))  Resp Panel by RT-PCR (Flu A&B, Covid) Nasopharyngeal Swab     Status: None   Collection Time: 12/07/21  7:53 PM   Specimen: Nasopharyngeal Swab; Nasopharyngeal(NP) swabs in vial transport medium  Result Value Ref Range Status   SARS Coronavirus 2 by RT PCR NEGATIVE NEGATIVE Final    Comment: (NOTE) SARS-CoV-2 target nucleic acids are NOT DETECTED.  The SARS-CoV-2 RNA is generally detectable in upper respiratory specimens during the acute phase of infection. The lowest concentration of SARS-CoV-2 viral copies this assay can detect is 138 copies/mL. A  negative result does not preclude SARS-Cov-2 infection and should not be used as the sole basis for treatment or other patient management decisions. A negative result may occur with  improper specimen collection/handling, submission of specimen other than nasopharyngeal swab, presence of viral mutation(s) within the areas targeted by this assay, and inadequate number of viral copies(<138 copies/mL). A negative result must be combined with clinical observations, patient history, and epidemiological information. The expected result is Negative.  Fact Sheet for Patients:  EntrepreneurPulse.com.au  Fact Sheet for Healthcare Providers:  IncredibleEmployment.be  This test is no t yet approved or cleared by the [place] FDA and  has been authorized for detection and/or diagnosis of SARS-CoV-2 by FDA under an Emergency Use Authorization (EUA). This EUA will remain  in effect (meaning this test can be used) for the duration of the COVID-19 declaration under Section 564(b)(1) of the Act, 21 U.S.C.section 360bbb-3(b)(1), unless the authorization is terminated  or revoked sooner.       Influenza A by PCR NEGATIVE NEGATIVE Final   Influenza B by PCR NEGATIVE NEGATIVE Final    Comment: (NOTE) The Xpert Xpress SARS-CoV-2/FLU/RSV plus assay is intended as an aid in the diagnosis of influenza from Nasopharyngeal swab specimens and should not be used as a sole basis for treatment. Nasal washings and aspirates are unacceptable for Xpert Xpress SARS-CoV-2/FLU/RSV testing.  Fact Sheet for Patients: EntrepreneurPulse.com.au  Fact Sheet for Healthcare Providers: IncredibleEmployment.be  This test is not yet approved or cleared by the [place] FDA and has been authorized for detection and/or diagnosis of SARS-CoV-2 by FDA under an Emergency Use Authorization (EUA). This EUA will remain in effect (meaning this test can  be used) for the duration of the COVID-19 declaration under Section 564(b)(1) of the Act, 21 U.S.C. section 360bbb-3(b)(1), unless the authorization is terminated or revoked.  Performed at [facility], [address] [place], [place] [address]      Labs:  CBC: Recent Labs  Lab 12/07/21 1540 12/07/21 2005 12/08/21 0427 12/10/21 0459  WBC 17.3*  --  17.6* 6.3  HGB 14.6 15.3 14.0 12.5*  HCT 43.9 45.0 43.1 38.7*  MCV 76.3*  --  78.2* 79.0*  PLT 269  --  227 166   BMP &GFR Recent Labs  Lab 12/07/21 1540 12/07/21 2005 12/08/21 0427 12/09/21 0455 12/10/21 0459  NA 140 140 143 138 138  K 3.7 3.5 3.8 3.9 3.9  CL 95*  --  102 99 103  CO2 25  --  28 27 26   GLUCOSE 181*  --  145* 191* 176*  BUN 52*  --  44* 19 11  CREATININE 2.35*  --  1.96* 1.26* 1.12  CALCIUM 8.8*  --  8.4* 8.9 8.4*  MG  --   --  2.4 2.3 2.1  PHOS  --   --   --  2.0* 2.1*   Estimated Creatinine Clearance: 84.4 mL/min (by C-G formula based on SCr of 1.12 mg/dL). Liver & Pancreas: Recent Labs  Lab 12/07/21 1540 12/08/21 0427 12/09/21 0455 12/10/21 0459  AST 55* 57* 49*  --   ALT 31 32 33  --   ALKPHOS 97 85 84  --   BILITOT 1.1 0.9 1.2  --   PROT 8.1 7.4 7.2  --   ALBUMIN 4.3 3.9 3.8 3.2*   Recent Labs  Lab 12/07/21 1540  LIPASE 23   No results for input(s): AMMONIA in the last 168 hours. Diabetic: Recent Labs    12/08/21 0427  HGBA1C 10.3*   Recent Labs  Lab 12/08/21 0033 12/09/21 2132  GLUCAP 198* 168*   Cardiac Enzymes: Recent Labs  Lab 12/08/21 0427 12/09/21 0455 12/10/21 0459  CKTOTAL 3,568* 2,772* 1,595*   No results for input(s): PROBNP in the last 8760 hours. Coagulation Profile: No results for input(s): INR, PROTIME in the last 168 hours. Thyroid Function Tests: No results for input(s): TSH, T4TOTAL, FREET4, T3FREE, THYROIDAB in the last 72 hours. Lipid Profile: No results for input(s): CHOL, HDL, LDLCALC, TRIG, CHOLHDL, LDLDIRECT in the last 72  hours. Anemia Panel: No results for input(s): VITAMINB12, FOLATE, FERRITIN, TIBC, IRON, RETICCTPCT in the last 72 hours. Urine analysis:    Component Value Date/Time   COLORURINE AMBER (A) 12/07/2021  1908   APPEARANCEUR CLOUDY (A) 12/07/2021 1908   LABSPEC >1.030 (H) 12/07/2021 1908   PHURINE 5.5 12/07/2021 1908   GLUCOSEU >=500 (A) 12/07/2021 1908   HGBUR LARGE (A) 12/07/2021 1908   BILIRUBINUR NEGATIVE 12/07/2021 1908   KETONESUR NEGATIVE 12/07/2021 1908   PROTEINUR >300 (A) 12/07/2021 1908   NITRITE NEGATIVE 12/07/2021 1908   LEUKOCYTESUR NEGATIVE 12/07/2021 1908   Sepsis Labs: Invalid input(s): PROCALCITONIN, LACTICIDVEN   Time coordinating discharge: 45 minutes  SIGNED:  Mercy Riding, MD  [facility] 12/10/2021, 3:44 PM

## 2021-12-10 NOTE — TOC Transition Note (Signed)
Transition of Care [facility]) - CM/SW Discharge Note   Patient Details  Name: Anthony Graham MRN: [id number] Date of Birth: Sep 08, 1996  Transition of Care [facility]) CM/SW Contact:  Golda Acre, RN Phone Number: 12/10/2021, 9:22 AM   Clinical Narrative:    Dcd to return to home with self care.  No toc needs present.   Final next level of care: Home/Self Care Barriers to Discharge: No Barriers Identified   Patient Goals and CMS Choice Patient states their goals for this hospitalization and ongoing recovery are:: to go home      Discharge Placement                       Discharge Plan and Services   Discharge Planning Services: CM Consult                                 Social Determinants of Health (SDOH) Interventions     Readmission Risk Interventions No flowsheet data found.

## 2021-12-10 NOTE — Progress Notes (Signed)
Reviewed written d/c instructions w pt and his mom and all questions answered. He verbalized understanding. D/C via w/c w all belongings in stable condition.

## 2021-12-15 DIAGNOSIS — E103512 Type 1 diabetes mellitus with proliferative diabetic retinopathy with macular edema, left eye: Secondary | ICD-10-CM | POA: Insufficient documentation

## 2022-01-11 ENCOUNTER — Encounter (HOSPITAL_BASED_OUTPATIENT_CLINIC_OR_DEPARTMENT_OTHER): Payer: Self-pay

## 2022-01-11 ENCOUNTER — Emergency Department (HOSPITAL_BASED_OUTPATIENT_CLINIC_OR_DEPARTMENT_OTHER)
Admission: EM | Admit: 2022-01-11 | Discharge: 2022-01-11 | Disposition: A | Discharge status: Home / Self Care | Payer: BLUE CROSS/BLUE SHIELD | Attending: Emergency Medicine | Admitting: Emergency Medicine

## 2022-01-11 ENCOUNTER — Other Ambulatory Visit: Payer: Self-pay

## 2022-01-11 DIAGNOSIS — R000 Tachycardia, unspecified: Secondary | ICD-10-CM | POA: Diagnosis not present

## 2022-01-11 DIAGNOSIS — N189 Chronic kidney disease, unspecified: Secondary | ICD-10-CM | POA: Diagnosis not present

## 2022-01-11 DIAGNOSIS — R1084 Generalized abdominal pain: Secondary | ICD-10-CM | POA: Diagnosis present

## 2022-01-11 DIAGNOSIS — E1043 Type 1 diabetes mellitus with diabetic autonomic (poly)neuropathy: Secondary | ICD-10-CM | POA: Insufficient documentation

## 2022-01-11 DIAGNOSIS — Z79899 Other long term (current) drug therapy: Secondary | ICD-10-CM | POA: Diagnosis not present

## 2022-01-11 DIAGNOSIS — Z794 Long term (current) use of insulin: Secondary | ICD-10-CM | POA: Insufficient documentation

## 2022-01-11 DIAGNOSIS — K3184 Gastroparesis: Secondary | ICD-10-CM

## 2022-01-11 LAB — COMPREHENSIVE METABOLIC PANEL
ALT: 34 U/L (ref 0–44)
AST: 31 U/L (ref 15–41)
Albumin: 4.6 g/dL (ref 3.5–5.0)
Alkaline Phosphatase: 87 U/L (ref 38–126)
Anion gap: 17 — ABNORMAL HIGH (ref 5–15)
BUN: 27 mg/dL — ABNORMAL HIGH (ref 6–20)
CO2: 25 mmol/L (ref 22–32)
Calcium: 9.4 mg/dL (ref 8.9–10.3)
Chloride: 103 mmol/L (ref 98–111)
Creatinine, Ser: 1.52 mg/dL — ABNORMAL HIGH (ref 0.61–1.24)
GFR, Estimated: >60 mL/min (ref >60)
Glucose, Bld: 227 mg/dL — ABNORMAL HIGH (ref 70–99)
Potassium: 3.9 mmol/L (ref 3.5–5.1)
Sodium: 145 mmol/L (ref 135–145)
Total Bilirubin: 1.2 mg/dL (ref 0.3–1.2)
Total Protein: 8.2 g/dL — ABNORMAL HIGH (ref 6.5–8.1)

## 2022-01-11 LAB — CBC WITH DIFFERENTIAL/PLATELET
Abs Immature Granulocytes: 0.03 10*3/uL (ref 0.00–0.07)
Basophils Absolute: 0 10*3/uL (ref 0.0–0.1)
Basophils Relative: 0 %
Eosinophils Absolute: 0 10*3/uL (ref 0.0–0.5)
Eosinophils Relative: 0 %
HCT: 40.1 % (ref 39.0–52.0)
Hemoglobin: 13.4 g/dL (ref 13.0–17.0)
Immature Granulocytes: 0 %
Lymphocytes Relative: 14 %
Lymphs Abs: 1.2 10*3/uL (ref 0.7–4.0)
MCH: 25.6 pg — ABNORMAL LOW (ref 26.0–34.0)
MCHC: 33.4 g/dL (ref 30.0–36.0)
MCV: 76.7 fL — ABNORMAL LOW (ref 80.0–100.0)
Monocytes Absolute: 0.6 10*3/uL (ref 0.1–1.0)
Monocytes Relative: 7 %
Neutro Abs: 6.7 10*3/uL (ref 1.7–7.7)
Neutrophils Relative %: 79 %
Platelets: 142 10*3/uL — ABNORMAL LOW (ref 150–400)
RBC: 5.23 MIL/uL (ref 4.22–5.81)
RDW: 14 % (ref 11.5–15.5)
WBC: 8.5 10*3/uL (ref 4.0–10.5)
nRBC: 0 % (ref 0.0–0.2)

## 2022-01-11 LAB — URINALYSIS, ROUTINE W REFLEX MICROSCOPIC
Bilirubin Urine: NEGATIVE
Glucose, UA: >=500 mg/dL — AB
Ketones, ur: 40 mg/dL — AB
Leukocytes,Ua: NEGATIVE
Nitrite: NEGATIVE
Protein, ur: >300 mg/dL — AB
Specific Gravity, Urine: 1.02 (ref 1.005–1.030)
pH: 5.5 (ref 5.0–8.0)

## 2022-01-11 LAB — URINALYSIS, MICROSCOPIC (REFLEX)

## 2022-01-11 LAB — CBG MONITORING, ED: Glucose-Capillary: 165 mg/dL — ABNORMAL HIGH (ref 70–99)

## 2022-01-11 LAB — LIPASE, BLOOD: Lipase: 34 U/L (ref 11–51)

## 2022-01-11 MED ORDER — LACTATED RINGERS IV BOLUS
1000.0000 mL | Freq: Once | INTRAVENOUS | Status: AC
  Administered 2022-01-11: 1000 mL via INTRAVENOUS

## 2022-01-11 MED ORDER — FENTANYL CITRATE PF 50 MCG/ML IJ SOSY
50.0000 ug | PREFILLED_SYRINGE | Freq: Once | INTRAMUSCULAR | Status: AC
  Administered 2022-01-11: 50 ug via INTRAVENOUS
  Filled 2022-01-11: qty 1

## 2022-01-11 MED ORDER — HALOPERIDOL LACTATE 5 MG/ML IJ SOLN
5.0000 mg | Freq: Once | INTRAMUSCULAR | Status: AC
  Administered 2022-01-11: 5 mg via INTRAVENOUS
  Filled 2022-01-11: qty 1

## 2022-01-11 MED ORDER — METOCLOPRAMIDE HCL 5 MG/ML IJ SOLN
10.0000 mg | Freq: Once | INTRAMUSCULAR | Status: AC
  Administered 2022-01-11: 10 mg via INTRAVENOUS
  Filled 2022-01-11: qty 2

## 2022-01-11 NOTE — ED Provider Notes (Signed)
[facility] EMERGENCY DEPARTMENT Provider Note    CSN: [id number] Arrival date & time: 01/11/22 0605  History Chief Complaint  Patient presents with   Abdominal Pain    Anthony Graham is a 26 y.o. male with history of Type 1 DM complicated by gastroparesis reports 2 days of diffuse abdominal pain, persistent vomiting and watery diarrhea. No fevers. Similar to previous. He recently had eye surgery for retinal detachment (has a bubble in his L eye). No new oral meds for same but he is on steroid drops. He reports sugars have been elevated into the 200s at home.    Home Medications Prior to Admission medications   Medication Sig Start Date End Date Taking? Authorizing Provider  amLODipine (NORVASC) 10 MG tablet Take 1 tablet (10 mg total) by mouth daily. 12/10/21   Almon Hercules, MD  carvedilol (COREG) 6.25 MG tablet Take 1 tablet (6.25 mg total) by mouth 2 (two) times daily with a meal. 12/10/21   Almon Hercules, MD  Elastic Bandages & Supports (MEDICAL COMPRESSION STOCKINGS) MISC Dispense 1 pair of knee-high compression stockings, to be used when you are continuously standing 03/26/21   Pollina, Canary Brim, MD  ergocalciferol (VITAMIN D2) 1.25 MG (50000 UT) capsule Take 50,000 Units by mouth once a week. Friday's    [provider]  Glucagon (BAQSIMI ONE PACK) 3 MG/DOSE POWD Place 1 spray into the nose as needed. 02/07/21   [provider]  insulin aspart (NOVOLOG) 100 UNIT/ML FlexPen Inject 90 Units into the skin continuous. Via insulin pump 01/30/17   [provider]  ketorolac (ACULAR) 0.5 % ophthalmic solution Place 1 drop into the right eye 4 (four) times daily. 08/31/21   [provider]  lisinopril (ZESTRIL) 20 MG tablet Take 20 mg by mouth daily. 11/23/21   [provider]  metoCLOPramide (REGLAN) 5 MG tablet Take 1 tablet (5 mg total) by mouth 3 (three) times daily before meals. 12/10/21 01/09/22  Almon Hercules, MD  ondansetron  (ZOFRAN) 4 MG tablet Take 1 tablet (4 mg total) by mouth every 6 (six) hours as needed for nausea or vomiting. 12/10/21 12/10/22  Almon Hercules, MD  pantoprazole (PROTONIX) 40 MG tablet Take 1 tablet (40 mg total) by mouth daily. 12/10/21   Almon Hercules, MD  prednisoLONE acetate (PRED FORTE) 1 % ophthalmic suspension Place 1 drop into the right eye in the morning, at noon, and at bedtime. 06/27/21   [provider]  potassium chloride SA (KLOR-CON) 20 MEQ tablet Take 1 tablet (20 mEq total) by mouth daily. 03/02/20 08/05/20  Petrucelli, Pleas Koch, PA-C     Allergies    Apple, Banana, and Gramineae pollens   Review of Systems   Review of Systems Please see HPI for pertinent positives and negatives  Physical Exam BP (!) 175/115    Pulse (!) 120    Temp 98.5 F (36.9 C) (Oral)    Resp 17    Ht 5\' 6"  (1.676 m)    Wt 59 kg    SpO2 99%    BMI 20.98 kg/m   Physical Exam Vitals and nursing note reviewed.  Constitutional:      Appearance: Normal appearance.     Comments: thin  HENT:     Head: Normocephalic and atraumatic.     Nose: Nose normal.     Mouth/Throat:     Mouth: Mucous membranes are moist.  Eyes:     Extraocular Movements: Extraocular movements  intact.     Conjunctiva/sclera: Conjunctivae normal.  Cardiovascular:     Rate and Rhythm: Tachycardia present.  Pulmonary:     Effort: Pulmonary effort is normal.     Breath sounds: Normal breath sounds.  Abdominal:     General: Abdomen is flat.     Palpations: Abdomen is soft.     Tenderness: There is abdominal tenderness (diffuse). There is no guarding.  Musculoskeletal:        General: No swelling. Normal range of motion.     Cervical back: Neck supple.  Skin:    General: Skin is warm and dry.  Neurological:     General: No focal deficit present.     Mental Status: He is alert.  Psychiatric:        Mood and Affect: Mood normal.    ED Results / Procedures / Treatments   EKG EKG  Interpretation  Date/Time:  Wednesday January 11 2022 06:31:22 EST Ventricular Rate:  122 PR Interval:  112 QRS Duration: 78 QT Interval:  301 QTC Calculation: 429 R Axis:   63 Text Interpretation: Sinus tachycardia LAE, consider biatrial enlargement RSR' in V1 or V2, probably normal variant Interpretation limited secondary to artifact Confirmed by Zadie Rhine ([id number]) on 01/11/2022 6:38:39 AM  Procedures Procedures  Medications Ordered in the ED Medications  lactated ringers bolus 1,000 mL (0 mLs Intravenous Stopped 01/11/22 0832)  metoCLOPramide (REGLAN) injection 10 mg (10 mg Intravenous Given 01/11/22 0724)  fentaNYL (SUBLIMAZE) injection 50 mcg (50 mcg Intravenous Given 01/11/22 0723)  haloperidol lactate (HALDOL) injection 5 mg (5 mg Intravenous Given 01/11/22 0834)  lactated ringers bolus 1,000 mL (0 mLs Intravenous Stopped 01/11/22 0945)    Initial Impression and Plan  Patient with known Type 1 DM and prior history of gastroparesis here for exacerbation of same. Will check labs, EKG, begin LR, fentanyl for pain and reglan for nausea.   ED Course   Clinical Course as of 01/11/22 1043  Wed Jan 11, 2022  0710 CBC is unremarkable. CMP shows CKD about at baseline, mild hyperglycemia, normal bicarb, mildly elevated anion gap. Lipase is normal.  [CS]  0752 UA with glucose and moderate ketones. No signs of infection.  [CS]  0826 Minimal improvement with reglan. Will try Haldol which has helped him in the past. In addition to diabetic gastroparesis he also has history of marijuana use which may be contributing.  [CS]  1008 CBG improving. Patient reports improved nausea, will attempt PO trial.  [CS]  1041 Patient tolerating PO, mother reports they have nausea meds he can take at home and she is comfortable helping him manage his symptoms. Recommend PCP follow up. RTED for any other concerns.  [CS]    Clinical Course User Index [CS] Pollyann Savoy, MD     MDM  Rules/Calculators/A&P Medical Decision Making Problems Addressed: Gastroparesis: chronic illness or injury with exacerbation, progression, or side effects of treatment  Amount and/or Complexity of Data Reviewed Labs: ordered. Decision-making details documented in ED Course. ECG/medicine tests: ordered and independent interpretation performed. Decision-making details documented in ED Course.  Risk Prescription drug management. Decision regarding hospitalization.    Final Clinical Impression(s) / ED Diagnoses Final diagnoses:  Gastroparesis    Rx / DC Orders ED Discharge Orders     None        Pollyann Savoy, MD 01/11/22 1043

## 2022-01-11 NOTE — ED Triage Notes (Signed)
Patient presents with complaint of abdominal pain, nausea and vomiting which started yesterday.  He states he has a pmh of gastroparesis.  Unable to tolerate food or liquids.

## 2022-01-11 NOTE — ED Notes (Signed)
Pt vomited approx 300 ml bile like substance

## 2022-03-03 ENCOUNTER — Other Ambulatory Visit (HOSPITAL_COMMUNITY): Payer: Self-pay

## 2022-04-28 ENCOUNTER — Other Ambulatory Visit: Payer: Self-pay

## 2022-04-28 ENCOUNTER — Encounter (HOSPITAL_BASED_OUTPATIENT_CLINIC_OR_DEPARTMENT_OTHER): Payer: Self-pay | Admitting: Emergency Medicine

## 2022-04-28 ENCOUNTER — Inpatient Hospital Stay (HOSPITAL_BASED_OUTPATIENT_CLINIC_OR_DEPARTMENT_OTHER)
Admission: EM | Admit: 2022-04-28 | Discharge: 2022-04-30 | DRG: 074 | Disposition: A | Discharge status: Home / Self Care | Payer: BLUE CROSS/BLUE SHIELD | Attending: Internal Medicine | Admitting: Internal Medicine

## 2022-04-28 ENCOUNTER — Observation Stay (HOSPITAL_COMMUNITY): Payer: BLUE CROSS/BLUE SHIELD

## 2022-04-28 DIAGNOSIS — E1143 Type 2 diabetes mellitus with diabetic autonomic (poly)neuropathy: Secondary | ICD-10-CM | POA: Diagnosis not present

## 2022-04-28 DIAGNOSIS — Z9641 Presence of insulin pump (external) (internal): Secondary | ICD-10-CM | POA: Diagnosis present

## 2022-04-28 DIAGNOSIS — Z79899 Other long term (current) drug therapy: Secondary | ICD-10-CM

## 2022-04-28 DIAGNOSIS — Z833 Family history of diabetes mellitus: Secondary | ICD-10-CM | POA: Diagnosis not present

## 2022-04-28 DIAGNOSIS — R739 Hyperglycemia, unspecified: Secondary | ICD-10-CM

## 2022-04-28 DIAGNOSIS — R112 Nausea with vomiting, unspecified: Principal | ICD-10-CM | POA: Diagnosis present

## 2022-04-28 DIAGNOSIS — E1043 Type 1 diabetes mellitus with diabetic autonomic (poly)neuropathy: Principal | ICD-10-CM | POA: Diagnosis present

## 2022-04-28 DIAGNOSIS — E119 Type 2 diabetes mellitus without complications: Secondary | ICD-10-CM

## 2022-04-28 DIAGNOSIS — E860 Dehydration: Secondary | ICD-10-CM | POA: Diagnosis not present

## 2022-04-28 DIAGNOSIS — N182 Chronic kidney disease, stage 2 (mild): Secondary | ICD-10-CM | POA: Diagnosis present

## 2022-04-28 DIAGNOSIS — Z91048 Other nonmedicinal substance allergy status: Secondary | ICD-10-CM

## 2022-04-28 DIAGNOSIS — N184 Chronic kidney disease, stage 4 (severe): Secondary | ICD-10-CM | POA: Diagnosis present

## 2022-04-28 DIAGNOSIS — I10 Essential (primary) hypertension: Secondary | ICD-10-CM | POA: Diagnosis present

## 2022-04-28 DIAGNOSIS — E1022 Type 1 diabetes mellitus with diabetic chronic kidney disease: Secondary | ICD-10-CM | POA: Diagnosis not present

## 2022-04-28 DIAGNOSIS — D631 Anemia in chronic kidney disease: Secondary | ICD-10-CM | POA: Diagnosis present

## 2022-04-28 DIAGNOSIS — Z91018 Allergy to other foods: Secondary | ICD-10-CM | POA: Diagnosis not present

## 2022-04-28 DIAGNOSIS — Z794 Long term (current) use of insulin: Secondary | ICD-10-CM

## 2022-04-28 DIAGNOSIS — K3184 Gastroparesis: Secondary | ICD-10-CM | POA: Diagnosis not present

## 2022-04-28 DIAGNOSIS — N179 Acute kidney failure, unspecified: Secondary | ICD-10-CM | POA: Diagnosis not present

## 2022-04-28 DIAGNOSIS — E1065 Type 1 diabetes mellitus with hyperglycemia: Secondary | ICD-10-CM | POA: Diagnosis present

## 2022-04-28 DIAGNOSIS — I129 Hypertensive chronic kidney disease with stage 1 through stage 4 chronic kidney disease, or unspecified chronic kidney disease: Secondary | ICD-10-CM | POA: Diagnosis present

## 2022-04-28 DIAGNOSIS — D649 Anemia, unspecified: Secondary | ICD-10-CM | POA: Diagnosis present

## 2022-04-28 DIAGNOSIS — E861 Hypovolemia: Secondary | ICD-10-CM | POA: Diagnosis not present

## 2022-04-28 DIAGNOSIS — R197 Diarrhea, unspecified: Secondary | ICD-10-CM

## 2022-04-28 LAB — URINALYSIS, MICROSCOPIC (REFLEX)

## 2022-04-28 LAB — COMPREHENSIVE METABOLIC PANEL
ALT: 27 U/L (ref 0–44)
AST: 41 U/L (ref 15–41)
Albumin: 4.5 g/dL (ref 3.5–5.0)
Alkaline Phosphatase: 90 U/L (ref 38–126)
Anion gap: 18 — ABNORMAL HIGH (ref 5–15)
BUN: 22 mg/dL — ABNORMAL HIGH (ref 6–20)
CO2: 22 mmol/L (ref 22–32)
Calcium: 9.6 mg/dL (ref 8.9–10.3)
Chloride: 100 mmol/L (ref 98–111)
Creatinine, Ser: 2.16 mg/dL — ABNORMAL HIGH (ref 0.61–1.24)
GFR, Estimated: 43 mL/min — ABNORMAL LOW (ref >60)
Glucose, Bld: 413 mg/dL — ABNORMAL HIGH (ref 70–99)
Potassium: 4.1 mmol/L (ref 3.5–5.1)
Sodium: 140 mmol/L (ref 135–145)
Total Bilirubin: 1.4 mg/dL — ABNORMAL HIGH (ref 0.3–1.2)
Total Protein: 8.1 g/dL (ref 6.5–8.1)

## 2022-04-28 LAB — URINALYSIS, ROUTINE W REFLEX MICROSCOPIC
Bilirubin Urine: NEGATIVE
Glucose, UA: >=500 mg/dL — AB
Ketones, ur: 15 mg/dL — AB
Leukocytes,Ua: NEGATIVE
Nitrite: NEGATIVE
Protein, ur: >=300 mg/dL — AB
Specific Gravity, Urine: 1.02 (ref 1.005–1.030)
pH: 6.5 (ref 5.0–8.0)

## 2022-04-28 LAB — RAPID URINE DRUG SCREEN, HOSP PERFORMED
Amphetamines: NOT DETECTED
Barbiturates: NOT DETECTED
Benzodiazepines: NOT DETECTED
Cocaine: NOT DETECTED
Opiates: NOT DETECTED
Tetrahydrocannabinol: POSITIVE — AB

## 2022-04-28 LAB — CBC WITH DIFFERENTIAL/PLATELET
Abs Immature Granulocytes: 0.05 10*3/uL (ref 0.00–0.07)
Basophils Absolute: 0 10*3/uL (ref 0.0–0.1)
Basophils Relative: 0 %
Eosinophils Absolute: 0 10*3/uL (ref 0.0–0.5)
Eosinophils Relative: 0 %
HCT: 37.6 % — ABNORMAL LOW (ref 39.0–52.0)
Hemoglobin: 12.7 g/dL — ABNORMAL LOW (ref 13.0–17.0)
Immature Granulocytes: 1 %
Lymphocytes Relative: 4 %
Lymphs Abs: 0.5 10*3/uL — ABNORMAL LOW (ref 0.7–4.0)
MCH: 26.1 pg (ref 26.0–34.0)
MCHC: 33.8 g/dL (ref 30.0–36.0)
MCV: 77.4 fL — ABNORMAL LOW (ref 80.0–100.0)
Monocytes Absolute: 0.4 10*3/uL (ref 0.1–1.0)
Monocytes Relative: 4 %
Neutro Abs: 10 10*3/uL — ABNORMAL HIGH (ref 1.7–7.7)
Neutrophils Relative %: 91 %
Platelets: 179 10*3/uL (ref 150–400)
RBC: 4.86 MIL/uL (ref 4.22–5.81)
RDW: 12.9 % (ref 11.5–15.5)
WBC: 11 10*3/uL — ABNORMAL HIGH (ref 4.0–10.5)
nRBC: 0 % (ref 0.0–0.2)

## 2022-04-28 LAB — GLUCOSE, CAPILLARY: Glucose-Capillary: 173 mg/dL — ABNORMAL HIGH (ref 70–99)

## 2022-04-28 LAB — BASIC METABOLIC PANEL
Anion gap: 11 (ref 5–15)
BUN: 21 mg/dL — ABNORMAL HIGH (ref 6–20)
CO2: 24 mmol/L (ref 22–32)
Calcium: 8.6 mg/dL — ABNORMAL LOW (ref 8.9–10.3)
Chloride: 109 mmol/L (ref 98–111)
Creatinine, Ser: 1.91 mg/dL — ABNORMAL HIGH (ref 0.61–1.24)
GFR, Estimated: 50 mL/min — ABNORMAL LOW (ref >60)
Glucose, Bld: 227 mg/dL — ABNORMAL HIGH (ref 70–99)
Potassium: 3.8 mmol/L (ref 3.5–5.1)
Sodium: 144 mmol/L (ref 135–145)

## 2022-04-28 LAB — CBG MONITORING, ED
Glucose-Capillary: 472 mg/dL — ABNORMAL HIGH (ref 70–99)
Glucose-Capillary: 312 mg/dL — ABNORMAL HIGH (ref 70–99)

## 2022-04-28 LAB — MAGNESIUM: Magnesium: 2.2 mg/dL (ref 1.7–2.4)

## 2022-04-28 LAB — LIPASE, BLOOD: Lipase: 24 U/L (ref 11–51)

## 2022-04-28 MED ORDER — HYDRALAZINE HCL 20 MG/ML IJ SOLN
20.0000 mg | Freq: Four times a day (QID) | INTRAMUSCULAR | Status: DC | PRN
  Administered 2022-04-28 – 2022-04-30 (×4): 20 mg via INTRAVENOUS
  Filled 2022-04-28 (×4): qty 1

## 2022-04-28 MED ORDER — SODIUM CHLORIDE 0.9 % IV BOLUS: 1000.0000 mL | Freq: Once | INTRAVENOUS | Status: DC

## 2022-04-28 MED ORDER — DROPERIDOL 2.5 MG/ML IJ SOLN
1.2500 mg | Freq: Once | INTRAMUSCULAR | Status: AC
  Administered 2022-04-28: 1.25 mg via INTRAVENOUS
  Filled 2022-04-28: qty 2

## 2022-04-28 MED ORDER — FENTANYL CITRATE PF 50 MCG/ML IJ SOSY: 12.5000 ug | PREFILLED_SYRINGE | INTRAMUSCULAR | Status: DC | PRN

## 2022-04-28 MED ORDER — HALOPERIDOL LACTATE 5 MG/ML IJ SOLN
2.0000 mg | Freq: Once | INTRAMUSCULAR | Status: AC
Start: 2022-04-28 — End: 2022-04-28
  Administered 2022-04-28: 2 mg via INTRAVENOUS
  Filled 2022-04-28: qty 1

## 2022-04-28 MED ORDER — SODIUM CHLORIDE 0.9 % IV SOLN: INTRAVENOUS | Status: DC

## 2022-04-28 MED ORDER — FENTANYL CITRATE PF 50 MCG/ML IJ SOSY
50.0000 ug | PREFILLED_SYRINGE | Freq: Once | INTRAMUSCULAR | Status: AC
  Administered 2022-04-28: 50 ug via INTRAVENOUS
  Filled 2022-04-28: qty 1

## 2022-04-28 MED ORDER — INSULIN GLARGINE-YFGN 100 UNIT/ML ~~LOC~~ SOLN
15.0000 [IU] | Freq: Every day | SUBCUTANEOUS | Status: DC
  Administered 2022-04-28 – 2022-04-29 (×2): 15 [IU] via SUBCUTANEOUS
  Filled 2022-04-28: qty 0.15
  Filled 2022-04-28: qty 150
  Filled 2022-04-28: qty 0.15

## 2022-04-28 MED ORDER — PANTOPRAZOLE SODIUM 40 MG IV SOLR
40.0000 mg | Freq: Two times a day (BID) | INTRAVENOUS | Status: DC
Start: 2022-04-28 — End: 2022-04-30
  Administered 2022-04-28 – 2022-04-30 (×4): 40 mg via INTRAVENOUS
  Filled 2022-04-28 (×4): qty 10

## 2022-04-28 MED ORDER — LACTATED RINGERS IV SOLN: INTRAVENOUS | Status: AC

## 2022-04-28 MED ORDER — METOCLOPRAMIDE HCL 5 MG/ML IJ SOLN
5.0000 mg | Freq: Three times a day (TID) | INTRAMUSCULAR | Status: DC
  Administered 2022-04-28 – 2022-04-30 (×6): 5 mg via INTRAVENOUS
  Filled 2022-04-28 (×6): qty 2

## 2022-04-28 MED ORDER — INSULIN ASPART 100 UNIT/ML IJ SOLN
0.0000 [IU] | Freq: Every day | INTRAMUSCULAR | Status: DC
  Administered 2022-04-29: 2 [IU] via SUBCUTANEOUS

## 2022-04-28 MED ORDER — ONDANSETRON HCL 4 MG PO TABS: 4.0000 mg | ORAL_TABLET | Freq: Four times a day (QID) | ORAL | Status: DC | PRN

## 2022-04-28 MED ORDER — SODIUM CHLORIDE 0.9 % IV BOLUS
1000.0000 mL | Freq: Once | INTRAVENOUS | Status: AC
  Administered 2022-04-28: 1000 mL via INTRAVENOUS

## 2022-04-28 MED ORDER — SODIUM CHLORIDE 0.9 % IV BOLUS
1000.0000 mL | Freq: Once | INTRAVENOUS | Status: AC
Start: 2022-04-28 — End: 2022-04-28
  Administered 2022-04-28: 1000 mL via INTRAVENOUS

## 2022-04-28 MED ORDER — INSULIN ASPART 100 UNIT/ML IJ SOLN
0.0000 [IU] | Freq: Three times a day (TID) | INTRAMUSCULAR | Status: DC
  Administered 2022-04-28: 7 [IU] via SUBCUTANEOUS
  Administered 2022-04-29: 2 [IU] via SUBCUTANEOUS
  Administered 2022-04-29: 7 [IU] via SUBCUTANEOUS
  Administered 2022-04-29 – 2022-04-30 (×2): 1 [IU] via SUBCUTANEOUS
  Administered 2022-04-30: 2 [IU] via SUBCUTANEOUS

## 2022-04-28 MED ORDER — ONDANSETRON HCL 4 MG/2ML IJ SOLN
4.0000 mg | Freq: Once | INTRAMUSCULAR | Status: AC
  Administered 2022-04-28: 4 mg via INTRAVENOUS
  Filled 2022-04-28: qty 2

## 2022-04-28 MED ORDER — METOPROLOL TARTRATE 5 MG/5ML IV SOLN
5.0000 mg | Freq: Three times a day (TID) | INTRAVENOUS | Status: DC
  Administered 2022-04-28 – 2022-04-29 (×2): 5 mg via INTRAVENOUS
  Filled 2022-04-28 (×2): qty 5

## 2022-04-28 MED ORDER — ONDANSETRON HCL 4 MG/2ML IJ SOLN
4.0000 mg | Freq: Four times a day (QID) | INTRAMUSCULAR | Status: DC | PRN
Start: 2022-04-28 — End: 2022-04-30
  Administered 2022-04-28: 4 mg via INTRAVENOUS
  Filled 2022-04-28: qty 2

## 2022-04-28 MED ORDER — TRAMADOL HCL 50 MG PO TABS
50.0000 mg | ORAL_TABLET | Freq: Three times a day (TID) | ORAL | Status: DC | PRN
  Administered 2022-04-29 – 2022-04-30 (×2): 50 mg via ORAL
  Filled 2022-04-28 (×2): qty 1

## 2022-04-28 MED ORDER — INSULIN GLARGINE-YFGN 100 UNIT/ML ~~LOC~~ SOLN: 15.0000 [IU] | Freq: Every day | SUBCUTANEOUS | Status: DC

## 2022-04-28 MED ORDER — DIPHENHYDRAMINE HCL 50 MG/ML IJ SOLN
25.0000 mg | Freq: Once | INTRAMUSCULAR | Status: AC
  Administered 2022-04-28: 25 mg via INTRAVENOUS
  Filled 2022-04-28: qty 1

## 2022-04-28 MED ORDER — INSULIN ASPART 100 UNIT/ML IJ SOLN
0.0000 [IU] | Freq: Three times a day (TID) | INTRAMUSCULAR | Status: DC
  Administered 2022-04-28: 9 [IU] via SUBCUTANEOUS

## 2022-04-28 MED ORDER — HEPARIN SODIUM (PORCINE) 5000 UNIT/ML IJ SOLN
5000.0000 [IU] | Freq: Three times a day (TID) | INTRAMUSCULAR | Status: DC
  Administered 2022-04-28 – 2022-04-30 (×6): 5000 [IU] via SUBCUTANEOUS
  Filled 2022-04-28 (×6): qty 1

## 2022-04-28 NOTE — ED Notes (Signed)
Ambulated to BR.

## 2022-04-28 NOTE — H&P (Signed)
History and Physical  Anthony Graham MMH:[id number] DOB: 1996-04-08 DOA: 04/28/2022  PCP: Robert Bellow, PA-C Patient coming from: Home   I have personally briefly reviewed patient's old medical records in [name]   Chief Complaint: Nausea,  vomiting and diarrhea  HPI: Anthony Graham is a 26 y.o. male past medical history significant for diabetes type 1, gastroparesis, hypertension who presents with nausea vomiting and diarrhea and abdominal pain that is started 1 day and a half prior to admission.  He reports this is similar to his gastroparesis.  He gets episode of gastroparesis every 6 months.  No clear precipitant factor.  He reports multiple episodes of watery diarrhea every other where he has a bowel movement.  As he has been in the hospital diarrhea has improved some.  He reports mild cough.  He denies headache currently.  Although he gets headaches sometimes.  He has been using his insulin.  He has not been able to take oral medications.  He does smoke, marijuana.  Evaluation in the ED: Sodium 140, potassium 4.1, glucose 413, creatinine 2.1, anion gap 18, bilirubin 1.4, subsequent be met show creatinine of 1.9, anion gap 11, bicarb 24.  UA with 0-5 white blood cell.  On arrival to the hospital: His blood pressure was elevated systolic blood pressure 159/458, heart rate 121.    Review of Systems: All systems reviewed and apart from history of presenting illness, are negative.  Past Medical History:  Diagnosis Date   Diabetes mellitus without complication ([place])    Gastroparesis    HTN (hypertension)    Past Surgical History:  Procedure Laterality Date   ESOPHAGOGASTRODUODENOSCOPY     EYE SURGERY     Social History:  reports that he has never smoked. He has never been exposed to tobacco smoke. He has never used smokeless tobacco. He reports that he does not currently use alcohol. He reports current drug use. Drug: Marijuana.   Allergies  Allergen Reactions    Apple Juice Itching   Banana Itching   Gramineae Pollens     Other reaction(s): Other (See Comments) "Sore throat, runny nose, sneezing"    Family History  Problem Relation Age of Onset   Diabetes Mother    Diabetes Other     Prior to Admission medications   Medication Sig Start Date End Date Taking? Authorizing Provider  amLODipine (NORVASC) 10 MG tablet Take 1 tablet (10 mg total) by mouth daily. 12/10/21   Mercy Riding, MD  carvedilol (COREG) 6.25 MG tablet Take 1 tablet (6.25 mg total) by mouth 2 (two) times daily with a meal. 12/10/21   Mercy Riding, MD  Elastic Bandages & Supports (MEDICAL COMPRESSION STOCKINGS) MISC Dispense 1 pair of knee-high compression stockings, to be used when you are continuously standing 03/26/21   Pollina, Gwenyth Allegra, MD  ergocalciferol (VITAMIN D2) 1.25 MG (50000 UT) capsule Take 50,000 Units by mouth once a week. Friday's    [provider]  Glucagon (BAQSIMI ONE PACK) 3 MG/DOSE POWD Place 1 spray into the nose as needed. 02/07/21   [provider]  insulin aspart (NOVOLOG) 100 UNIT/ML FlexPen Inject 90 Units into the skin continuous. Via insulin pump 01/30/17   [provider]  ketorolac (ACULAR) 0.5 % ophthalmic solution Place 1 drop into the right eye 4 (four) times daily. 08/31/21   [provider]  lisinopril (ZESTRIL) 20 MG tablet Take 20 mg by mouth daily. 11/23/21   [provider]  metoCLOPramide (REGLAN) 5 MG tablet Take 1 tablet (5 mg total) by mouth 3 (three) times daily before meals. 12/10/21 01/09/22  Mercy Riding, MD  ondansetron (ZOFRAN) 4 MG tablet Take 1 tablet (4 mg total) by mouth every 6 (six) hours as needed for nausea or vomiting. 12/10/21 12/10/22  Mercy Riding, MD  pantoprazole (PROTONIX) 40 MG tablet Take 1 tablet (40 mg total) by mouth daily. 12/10/21   Mercy Riding, MD  prednisoLONE acetate (PRED FORTE) 1 % ophthalmic suspension Place 1 drop into the right eye in the morning, at  noon, and at bedtime. 06/27/21   [provider]  potassium chloride SA (KLOR-CON) 20 MEQ tablet Take 1 tablet (20 mEq total) by mouth daily. 03/02/20 08/05/20  Petrucelli, Glynda Jaeger, PA-C   Physical Exam: Vitals:   04/28/22 1100 04/28/22 1200 04/28/22 1400 04/28/22 1527  BP: (!) 167/102 (!) 160/100 138/84 (!) 145/98  Pulse: (!) 123 (!) 120 (!) 115 (!) 113  Resp: _0 (!) 21  Temp:    98.8 F (37.1 C)  TempSrc:    Oral  SpO2: 99% 97% 96% 99%  Weight:      Height:        General exam: Moderately built and nourished patient, lying comfortably supine on the gurney in no obvious distress. Head, eyes and ENT: Nontraumatic and normocephalic. Pupils equally reacting to light and accommodation. Oral mucosa moist. Neck: Supple. No JVD, carotid bruit or thyromegaly. Lymphatics: No lymphadenopathy. Respiratory system: Clear to auscultation. No increased work of breathing. Cardiovascular system: S1 and S2 heard, RRR. No JVD, murmurs, gallops, clicks or pedal edema. Gastrointestinal system: Abdomen is nondistended, soft and nontender. Normal bowel sounds heard. No organomegaly or masses appreciated. Central nervous system: Alert and oriented. No focal neurological deficits. Extremities: Symmetric 5 x 5 power. Peripheral pulses symmetrically felt.  Skin: No rashes or acute findings. Musculoskeletal system: Negative exam. Psychiatry: Pleasant and cooperative.   Labs on Admission:  Basic Metabolic Panel: Recent Labs  Lab 04/28/22 0349 04/28/22 0618  NA 140 144  K 4.1 3.8  CL 100 109  CO2 22 24  GLUCOSE 413* 227*  BUN 22* 21*  CREATININE 2.16* 1.91*  CALCIUM 9.6 8.6*   Liver Function Tests: Recent Labs  Lab 04/28/22 0349  AST 41  ALT 27  ALKPHOS 90  BILITOT 1.4*  PROT 8.1  ALBUMIN 4.5   Recent Labs  Lab 04/28/22 0349  LIPASE 24   No results for input(s): AMMONIA in the last 168 hours. CBC: Recent Labs  Lab 04/28/22 0349  WBC 11.0*  NEUTROABS 10.0*  HGB  12.7*  HCT 37.6*  MCV 77.4*  PLT 179   Cardiac Enzymes: No results for input(s): CKTOTAL, CKMB, CKMBINDEX, TROPONINI in the last 168 hours.  BNP (last 3 results) No results for input(s): PROBNP in the last 8760 hours. CBG: Recent Labs  Lab 04/28/22 1231 04/28/22 1614  GLUCAP 472* 312*    Radiological Exams on Admission: No results found.  EKG: Independently reviewed.  Sinus tachycardia  Assessment/Plan Principal Problem:   Gastroparesis due to DM [facility]) Active Problems:   Intractable nausea and vomiting   AKI (acute kidney injury) ([place])   HTN (hypertension)   Normocytic anemia   1-Gastroparesis in setting of Diabetes type 1: He presented with nausea, vomiting,  diarrhea and abdominal pain. May be precipitated by marijuana use Continue with IV fluids Schedule IV Reglan, he is on Reglan at home. IV as needed Zofran Start IV  Protonix. As needed fentanyl for pain. Check Stool culture.   2-Diabetes type 1: Hyperglycemia , uncontrolled.  -He uses insulin pump at home. -Continue  with Semglee 15 units every 24 hours -SSI.   3-HTN Severe:  Start PRN IV hydralazine.  Schedule IV metoprolol.  Resume home meds when able to take oral meds.   4-Tachycardia;  Continue with IV fluids.  Schedule metoprolol. He is on carvedilol at home.   5-AKI on CKD stage II:  Prior Cr Range 1.1--1.5 Acute AKI in setting hypovolemia, dehydration.  Continue with IV fluids.  Strict I and O.   6-Cough: check chest x ray.      DVT Prophylaxis: Heparin Code Status: Full code Family Communication: Care discussed with Mother who was at bedside.  Disposition Plan:  Admit for IV fluids, hydration.   Time spent: 75 minutes.     Elmarie Shiley MD [facility]   04/28/2022, 4:57 PM

## 2022-04-28 NOTE — Progress Notes (Signed)
Plan of Care Note for accepted transfer   Patient: Anthony Graham MRN: [id number]   [place]: 04/28/2022  Facility requesting transfer: [facility].. Requesting Provider: Deno Etienne, DO. Reason for transfer: Intractable nausea and vomiting. Facility course:  26 year old male with a past medical history of type 1 diabetes hypertension and gastroparesis who presented to the emergency department with abdominal pain, multiple episodes of nausea and emesis for the past 24 hours that has not responded to treatment.  ED provider is requesting observation for intractable abdominal pain, nausea and vomiting due to gastroparesis.  Plan of care: The patient is accepted for admission to Telemetry unit, at [facility].  Author: Reubin Milan, MD 04/28/2022  Check [email or web address] for on-call coverage.  Nursing staff, Please call [place] number on Amion as soon as patient's arrival, so appropriate admitting provider can evaluate the pt.

## 2022-04-28 NOTE — ED Notes (Signed)
CBG 472, Provider ER provider notified. 9 units of Novalog to be given along with 15U of Semglee. Pt not tolerating PO liquids

## 2022-04-28 NOTE — Progress Notes (Signed)
   04/28/22 1703  Assess: MEWS Score  Temp 99.7 F (37.6 C)  BP (!) 188/114  Pulse Rate (!) 121  Resp 18  Level of Consciousness Alert  SpO2 100 %  O2 Device Room Air  Assess: MEWS Score  MEWS Temp 0  MEWS Systolic 0  MEWS Pulse 2  MEWS RR 0  MEWS LOC 0  MEWS Score 2  MEWS Score Color Yellow  Assess: if the MEWS score is Yellow or Red  Were vital signs taken at a resting state? Yes  Focused Assessment No change from prior assessment  Does the patient meet 2 or more of the SIRS criteria? No  MEWS guidelines implemented *See Row Information* Yes  Treat  MEWS Interventions Administered prn meds/treatments  Take Vital Signs  Increase Vital Sign Frequency  Yellow: Q 2hr X 2 then Q 4hr X 2, if remains yellow, continue Q 4hrs  Escalate  MEWS: Escalate Yellow: discuss with charge nurse/RN and consider discussing with provider and RRT  Notify: Charge Nurse/RN  Name of Charge Nurse/RN Notified Renaldo Harrison RN  Date Charge Nurse/RN Notified 04/28/22  Time Charge Nurse/RN Notified 1703  Notify: Provider  Provider Name/Title Hartley Barefoot MD  Date Provider Notified 04/28/22  Time Provider Notified 1703  Method of Notification Face-to-face  Notification Reason Other (Comment)  Provider response At bedside  Date of Provider Response 04/28/22  Time of Provider Response 1715  Document  Patient Outcome Other (Comment)  Progress note created (see row info) Yes  Assess: SIRS CRITERIA  SIRS Temperature  0  SIRS Pulse 1  SIRS Respirations  0  SIRS WBC 0  SIRS Score Sum  1   Patient admitted in yellow MEWS, charge nurse and admitting doctor aware. PRN medications administered for heart rate and blood pressure. MEWS guidelines implemented, will continue to monitor patient.

## 2022-04-28 NOTE — Progress Notes (Signed)
Inpatient Diabetes Program Recommendations  AACE/ADA: New Consensus Statement on Inpatient Glycemic Control  Target Ranges:  Prepandial:   less than 140 mg/dL      Peak postprandial:   less than 180 mg/dL (1-2 hours)      Critically ill patients:  140 - 180 mg/dL    Latest Reference Range & Units 04/28/22 03:49 04/28/22 06:18  CO2 22 - 32 mmol/L 22 24  Glucose 70 - 99 mg/dL 676 (H) 720 (H)  Anion gap 5 - 15  18 (H) 11    Latest Reference Range & Units 12/08/21 04:27  Hemoglobin A1C 4.8 - 5.6 % 10.3 (H)   Review of Glycemic Control  Diabetes history: DM1 (makes NO insulin; requires basal, correction, and carbohydrate coverage insulin) Outpatient Diabetes medications: OmniPod insulin pump; when not on insulin pump takes [place] 22 units daily, Novolog 1 unit per 10 grams of carbs, plus Novolog correction insulin Current orders for Inpatient glycemic control: None; in [facility]  Inpatient Diabetes Program Recommendations:    Insulin: Please consider ordering Semglee 15 units Q24H (based on 61.2 kg x 0.25 units), CBGs Q4H, and Novolog 0-9 units Q4H. Once patient is able to eat and tolerating diet, will need to add meal coverage insulin as well.  NOTE: Patient currently at [facility] with nausea, vomiting, diarrhea for past 24 hours. Initial lab glucose 413 mg/dl on 9/47/09. Called patient's cell phone and spoke with his mother initially and then him over the phone. Patient's mother reports that patient uses OmniPod insulin pump for DM control but his pod expired yesterday and they are waiting on refill on insulin pump supplies which is expected to be here tomorrow. Patient's mother reports that patient took [place] 22 units yesterday afternoon. Spoke with patient and he stated that he was asleep yesterday when his mother gave him the [place]. He reports that he is expecting pump refill tomorrow and plans to resume his insulin pump when he receives supplies. Patient states that he takes Novolog 1 unit per  10 grams of carbs and also uses a correction scale (he does not know correction factor). Patient confirms that he seen Fredia Sorrow, FNP with [facility] on 02/10/22 and he states that pump settings should be as documented in the office note. Per office note the following should be insulin pump settings:  Basal:  0000: 0.45 0700: 0.55 Total Basal 12.5 units/24 hours  ISF: 60 ( 1 unit drops glucose 60 mg/dl) ICR: 16 g (1 unit covers 16 grams of carbs) BG target: 150 mg/dl AIT: 4 hours  Patient states that he uses FreeStyle [place] and he currently has sensor on. Patient reports that his glucose has been more elevated over past few days due to increased pain and stress. Patient reports he will be transferring to [facility] or [facility] for admission. Patient reports that he is unable to keep anything down yet. Discussed that it would be recommended to order [facility] and Novolog correction scale. Patient verbalized understanding of information and states he has no questions at this time.  Thanks, Orlando Penner, RN, MSN, CDE Diabetes Coordinator Inpatient Diabetes Program [phone] (Team Pager from 8am to 5pm)

## 2022-04-28 NOTE — ED Triage Notes (Signed)
Pt is c/o N/V/D x 24hrs   Pt is diabetic and states his sugars on his phone have been reading high  Pt states he has not been able to eat or drink anything today

## 2022-04-28 NOTE — ED Notes (Signed)
Assisted to BR via w/c

## 2022-04-28 NOTE — Progress Notes (Signed)
   04/28/22 2055  Vitals  Temp 98.5 F (36.9 C)  Temp Source Oral  BP (!) 191/118 (RN notified)  MAP (mmHg) 136  BP Location Left Arm  BP Method Automatic  Patient Position (if appropriate) Lying  Pulse Rate (!) 125  Pulse Rate Source Monitor  Resp 18  MEWS COLOR  MEWS Score Color Yellow  Oxygen Therapy  SpO2 100 %  O2 Device Room Air  MEWS Score  MEWS Temp 0  MEWS Systolic 0  MEWS Pulse 2  MEWS RR 0  MEWS LOC 0  MEWS Score 2   Charge and MD made aware. Gave PRN hydralazine and administered metoprolol early. Continue with yellow V/S protocol and monitoring. Pt was symptomatic vomiting.

## 2022-04-28 NOTE — ED Notes (Signed)
ED Provider at bedside. 

## 2022-04-28 NOTE — Progress Notes (Signed)
   04/28/22 2228  Vitals  BP (!) 155/115  MAP (mmHg) 127  BP Location Left Arm  BP Method Automatic  Patient Position (if appropriate) Lying  Pulse Rate (!) 117  Pulse Rate Source Monitor  Level of Consciousness  Level of Consciousness Alert  Oxygen Therapy  SpO2 98 %  O2 Device Room Air   Pt sleeping continuing to monitor

## 2022-04-28 NOTE — ED Notes (Signed)
Unable to tolerate po fluids

## 2022-04-28 NOTE — ED Provider Notes (Signed)
[facility] EMERGENCY DEPARTMENT Provider Note   CSN: [id number] Arrival date & time: 04/28/22  2353     History  Chief Complaint  Patient presents with   Emesis    Anthony Graham is a 26 y.o. male.  The history is provided by the patient and a parent.  Emesis Severity:  Severe How soon after eating does vomiting occur:  24 hours Progression:  Worsening Chronicity:  Recurrent Relieved by:  Nothing Associated symptoms: abdominal pain   Associated symptoms: no fever    Patient history of diabetes, hypertension, gastroparesis presents with nausea vomiting diarrhea for the past 24 hours.  He reports this is similar to prior episodes of gastroparesis.  No fevers.  He reports his glucose has been reading high.  He has been able to take any  p.o. fluid  Home Medications Prior to Admission medications   Medication Sig Start Date End Date Taking? Authorizing Provider  amLODipine (NORVASC) 10 MG tablet Take 1 tablet (10 mg total) by mouth daily. 12/10/21   Almon Hercules, MD  carvedilol (COREG) 6.25 MG tablet Take 1 tablet (6.25 mg total) by mouth 2 (two) times daily with a meal. 12/10/21   Almon Hercules, MD  Elastic Bandages & Supports (MEDICAL COMPRESSION STOCKINGS) MISC Dispense 1 pair of knee-high compression stockings, to be used when you are continuously standing 03/26/21   Pollina, Canary Brim, MD  ergocalciferol (VITAMIN D2) 1.25 MG (50000 UT) capsule Take 50,000 Units by mouth once a week. Friday's    [provider]  Glucagon (BAQSIMI ONE PACK) 3 MG/DOSE POWD Place 1 spray into the nose as needed. 02/07/21   [provider]  insulin aspart (NOVOLOG) 100 UNIT/ML FlexPen Inject 90 Units into the skin continuous. Via insulin pump 01/30/17   [provider]  ketorolac (ACULAR) 0.5 % ophthalmic solution Place 1 drop into the right eye 4 (four) times daily. 08/31/21   [provider]  lisinopril (ZESTRIL) 20 MG tablet Take 20 mg by mouth  daily. 11/23/21   [provider]  metoCLOPramide (REGLAN) 5 MG tablet Take 1 tablet (5 mg total) by mouth 3 (three) times daily before meals. 12/10/21 01/09/22  Almon Hercules, MD  ondansetron (ZOFRAN) 4 MG tablet Take 1 tablet (4 mg total) by mouth every 6 (six) hours as needed for nausea or vomiting. 12/10/21 12/10/22  Almon Hercules, MD  pantoprazole (PROTONIX) 40 MG tablet Take 1 tablet (40 mg total) by mouth daily. 12/10/21   Almon Hercules, MD  prednisoLONE acetate (PRED FORTE) 1 % ophthalmic suspension Place 1 drop into the right eye in the morning, at noon, and at bedtime. 06/27/21   [provider]  potassium chloride SA (KLOR-CON) 20 MEQ tablet Take 1 tablet (20 mEq total) by mouth daily. 03/02/20 08/05/20  Petrucelli, Samantha R, PA-C      Allergies    Apple juice, Banana, and Gramineae pollens    Review of Systems   Review of Systems  Constitutional:  Negative for fever.  Gastrointestinal:  Positive for abdominal pain and vomiting.   Physical Exam Updated Vital Signs BP (!) 163/108   Pulse (!) 113   Temp 98 F (36.7 C) (Oral)   Resp (!) 22   Ht 1.676 m (5\' 6" )   Wt 61.2 kg   SpO2 100%   BMI 21.79 kg/m  Physical Exam CONSTITUTIONAL: Disheveled, lying prone ill-appearing HEAD: Normocephalic/atraumatic EYES: EOMI/PERRL, no icterus ENMT: Mucous membranes dry NECK: supple no meningeal signs  SPINE/BACK:entire spine nontender CV: S1/S2 noted, tachycardic LUNGS: Lungs are clear to auscultation bilaterally, no apparent distress ABDOMEN: soft, diffuse tenderness, no rebound or guarding, bowel sounds noted throughout abdomen GU:no cva tenderness NEURO: Pt is awake/alert/appropriate, moves all extremitiesx4.  No facial droop.   EXTREMITIES: pulses normal/equal, full ROM SKIN: warm, color normal PSYCH: Anxious  ED Results / Procedures / Treatments   Labs (all labs ordered are listed, but only abnormal results are displayed) Labs Reviewed  COMPREHENSIVE  METABOLIC PANEL - Abnormal; Notable for the following components:      Result Value   Glucose, Bld 413 (*)    BUN 22 (*)    Creatinine, Ser 2.16 (*)    Total Bilirubin 1.4 (*)    GFR, Estimated 43 (*)    Anion gap 18 (*)    All other components within normal limits  CBC WITH DIFFERENTIAL/PLATELET - Abnormal; Notable for the following components:   WBC 11.0 (*)    Hemoglobin 12.7 (*)    HCT 37.6 (*)    MCV 77.4 (*)    Neutro Abs 10.0 (*)    Lymphs Abs 0.5 (*)    All other components within normal limits  URINALYSIS, ROUTINE W REFLEX MICROSCOPIC - Abnormal; Notable for the following components:   Glucose, UA >=500 (*)    Hgb urine dipstick MODERATE (*)    Ketones, ur 15 (*)    Protein, ur >=300 (*)    All other components within normal limits  URINALYSIS, MICROSCOPIC (REFLEX) - Abnormal; Notable for the following components:   Bacteria, UA RARE (*)    All other components within normal limits  BASIC METABOLIC PANEL - Abnormal; Notable for the following components:   Glucose, Bld 227 (*)    BUN 21 (*)    Creatinine, Ser 1.91 (*)    Calcium 8.6 (*)    GFR, Estimated 50 (*)    All other components within normal limits  LIPASE, BLOOD    EKG EKG Interpretation  Date/Time:  Friday Apr 28 2022 04:03:07 EDT Ventricular Rate:  117 PR Interval:  122 QRS Duration: 85 QT Interval:  328 QTC Calculation: 458 R Axis:   62 Text Interpretation: Sinus tachycardia Right atrial enlargement Consider right ventricular hypertrophy Interpretation limited secondary to artifact Confirmed by Zadie RhineWickline, Rahm Minix ([id number]) on 04/28/2022 4:09:42 AM  Radiology No results found.  Procedures .Critical Care Performed by: Zadie RhineWickline, Shamyah Stantz, MD Authorized by: Zadie RhineWickline, Sarafina Puthoff, MD   Critical care provider statement:    Critical care time (minutes):  60   Critical care start time:  04/28/2022 5:30 AM   Critical care end time:  04/28/2022 6:30 AM   Critical care time was exclusive of:  Separately billable  procedures and treating other patients   Critical care was necessary to treat or prevent imminent or life-threatening deterioration of the following conditions:  Metabolic crisis, endocrine crisis and dehydration   Critical care was time spent personally by me on the following activities:  Development of treatment plan with patient or surrogate, re-evaluation of patient's condition, review of old charts, ordering and review of laboratory studies, ordering and performing treatments and interventions and pulse oximetry   I assumed direction of critical care for this patient from another provider in my specialty: no      Medications Ordered in ED Medications  sodium chloride 0.9 % bolus 1,000 mL (0 mLs Intravenous Stopped 04/28/22 0448)    And  0.9 %  sodium chloride infusion (0 mLs Intravenous Stopped 04/28/22 0619)  ondansetron (ZOFRAN) injection 4 mg (has no administration in time range)  sodium chloride 0.9 % bolus 1,000 mL (has no administration in time range)  ondansetron (ZOFRAN) injection 4 mg (4 mg Intravenous Given 04/28/22 0348)  haloperidol lactate (HALDOL) injection 2 mg (2 mg Intravenous Given 04/28/22 0445)  fentaNYL (SUBLIMAZE) injection 50 mcg (50 mcg Intravenous Given 04/28/22 0446)    ED Course/ Medical Decision Making/ A&P Clinical Course as of 04/28/22 0700  Fri Apr 28, 2022  0413 Patient still vomiting despite fluids and Zofran.  Will give Haldol 2 mg.  He is tolerating this previously [DW]  0422 Glucose(!): 413 Hyperglycemia noted [DW]  0422 Creatinine(!): 2.16 Acute kidney injury noted [DW]  0422 Minimally elevated anion gap noted, likely due to dehydration.  We will continue fluids [DW]  0514 Patient resting comfortably, vitals are improved.  We will recheck electrolyte [DW]  [id number] Patient vomiting again, he will be admitted [DW]  [phone] Patient long history of diabetes, gastroparesis versus irritable bowel syndrome presented with vomiting diarrhea.  He reports this is  similar to prior episodes.  Imaging deferred as he has had multiple work-ups in the past.  However due to intractable nausea vomiting despite meds/fluids he will be admitted [DW]  0700 Signed out to dr Adela Lank at shift change to call report [DW]    Clinical Course User Index [DW] Zadie Rhine, MD                           Medical Decision Making Amount and/or Complexity of Data Reviewed Labs: ordered. Decision-making details documented in ED Course. ECG/medicine tests: ordered.  Risk Prescription drug management.   This patient presents to the ED for concern of vomiting and diarrhea, this involves an extensive number of treatment options, and is a complaint that carries with it a high risk of complications and morbidity.  The differential diagnosis includes but is not limited to gastritis, gastroenteritis, colitis, diverticulitis  Comorbidities that complicate the patient evaluation: Patient's presentation is complicated by their history of diabetes and gastroparesis  Additional history obtained: Additional history obtained from family Records reviewed Primary Care Documents gastroenterology notes  Lab Tests: I Ordered, and personally interpreted labs.  The pertinent results include: hyperglycemia   Cardiac Monitoring: The patient was maintained on a cardiac monitor.  I personally viewed and interpreted the cardiac monitor which showed an underlying rhythm of:  sinus tachycardia  Medicines ordered and prescription drug management: I ordered medication including Zofran and Haldol for nausea Reevaluation of the patient after these medicines showed that the patient    improved IV fentanyl given for pain with some improvement   Critical Interventions:  2 L normal saline  Reevaluation: After the interventions noted above, I reevaluated the patient and found that they have :worsened  Complexity of problems addressed: Patient's presentation is most consistent with  acute  presentation with potential threat to life or bodily function  Disposition: After consideration of the diagnostic results and the patient's response to treatment,  I feel that the patent would benefit from admission   .           Final Clinical Impression(s) / ED Diagnoses Final diagnoses:  Nausea vomiting and diarrhea  Dehydration  Hyperglycemia  Intractable nausea and vomiting  AKI (acute kidney injury) (HCC)    Rx / DC Orders ED Discharge Orders     None         Zadie Rhine, MD 04/28/22 0700

## 2022-04-29 DIAGNOSIS — Z79899 Other long term (current) drug therapy: Secondary | ICD-10-CM | POA: Diagnosis not present

## 2022-04-29 DIAGNOSIS — I129 Hypertensive chronic kidney disease with stage 1 through stage 4 chronic kidney disease, or unspecified chronic kidney disease: Secondary | ICD-10-CM | POA: Diagnosis present

## 2022-04-29 DIAGNOSIS — D631 Anemia in chronic kidney disease: Secondary | ICD-10-CM | POA: Diagnosis present

## 2022-04-29 DIAGNOSIS — E861 Hypovolemia: Secondary | ICD-10-CM | POA: Diagnosis present

## 2022-04-29 DIAGNOSIS — E1022 Type 1 diabetes mellitus with diabetic chronic kidney disease: Secondary | ICD-10-CM | POA: Diagnosis present

## 2022-04-29 DIAGNOSIS — N179 Acute kidney failure, unspecified: Secondary | ICD-10-CM | POA: Diagnosis present

## 2022-04-29 DIAGNOSIS — Z91048 Other nonmedicinal substance allergy status: Secondary | ICD-10-CM | POA: Diagnosis not present

## 2022-04-29 DIAGNOSIS — Z9641 Presence of insulin pump (external) (internal): Secondary | ICD-10-CM | POA: Diagnosis present

## 2022-04-29 DIAGNOSIS — N182 Chronic kidney disease, stage 2 (mild): Secondary | ICD-10-CM | POA: Diagnosis present

## 2022-04-29 DIAGNOSIS — E860 Dehydration: Secondary | ICD-10-CM | POA: Diagnosis present

## 2022-04-29 DIAGNOSIS — Z91018 Allergy to other foods: Secondary | ICD-10-CM | POA: Diagnosis not present

## 2022-04-29 DIAGNOSIS — E1065 Type 1 diabetes mellitus with hyperglycemia: Secondary | ICD-10-CM | POA: Diagnosis present

## 2022-04-29 DIAGNOSIS — K3184 Gastroparesis: Secondary | ICD-10-CM | POA: Diagnosis present

## 2022-04-29 DIAGNOSIS — E1043 Type 1 diabetes mellitus with diabetic autonomic (poly)neuropathy: Secondary | ICD-10-CM | POA: Diagnosis present

## 2022-04-29 DIAGNOSIS — Z833 Family history of diabetes mellitus: Secondary | ICD-10-CM | POA: Diagnosis not present

## 2022-04-29 DIAGNOSIS — Z794 Long term (current) use of insulin: Secondary | ICD-10-CM | POA: Diagnosis not present

## 2022-04-29 DIAGNOSIS — E1143 Type 2 diabetes mellitus with diabetic autonomic (poly)neuropathy: Secondary | ICD-10-CM | POA: Diagnosis not present

## 2022-04-29 LAB — CBC
HCT: 36.6 % — ABNORMAL LOW (ref 39.0–52.0)
Hemoglobin: 11.7 g/dL — ABNORMAL LOW (ref 13.0–17.0)
MCH: 25.7 pg — ABNORMAL LOW (ref 26.0–34.0)
MCHC: 32 g/dL (ref 30.0–36.0)
MCV: 80.3 fL (ref 80.0–100.0)
Platelets: 176 10*3/uL (ref 150–400)
RBC: 4.56 MIL/uL (ref 4.22–5.81)
RDW: 13.2 % (ref 11.5–15.5)
WBC: 18.1 10*3/uL — ABNORMAL HIGH (ref 4.0–10.5)
nRBC: 0 % (ref 0.0–0.2)

## 2022-04-29 LAB — GLUCOSE, CAPILLARY
Glucose-Capillary: 143 mg/dL — ABNORMAL HIGH (ref 70–99)
Glucose-Capillary: 170 mg/dL — ABNORMAL HIGH (ref 70–99)
Glucose-Capillary: 162 mg/dL — ABNORMAL HIGH (ref 70–99)
Glucose-Capillary: 329 mg/dL — ABNORMAL HIGH (ref 70–99)
Glucose-Capillary: 206 mg/dL — ABNORMAL HIGH (ref 70–99)

## 2022-04-29 LAB — COMPREHENSIVE METABOLIC PANEL
ALT: 23 U/L (ref 0–44)
AST: 36 U/L (ref 15–41)
Albumin: 3.7 g/dL (ref 3.5–5.0)
Alkaline Phosphatase: 71 U/L (ref 38–126)
Anion gap: 10 (ref 5–15)
BUN: 27 mg/dL — ABNORMAL HIGH (ref 6–20)
CO2: 23 mmol/L (ref 22–32)
Calcium: 8.6 mg/dL — ABNORMAL LOW (ref 8.9–10.3)
Chloride: 110 mmol/L (ref 98–111)
Creatinine, Ser: 1.98 mg/dL — ABNORMAL HIGH (ref 0.61–1.24)
GFR, Estimated: 47 mL/min — ABNORMAL LOW (ref >60)
Glucose, Bld: 158 mg/dL — ABNORMAL HIGH (ref 70–99)
Potassium: 4 mmol/L (ref 3.5–5.1)
Sodium: 143 mmol/L (ref 135–145)
Total Bilirubin: 0.9 mg/dL (ref 0.3–1.2)
Total Protein: 6.8 g/dL (ref 6.5–8.1)

## 2022-04-29 MED ORDER — CARVEDILOL 6.25 MG PO TABS
6.2500 mg | ORAL_TABLET | Freq: Two times a day (BID) | ORAL | Status: DC
  Administered 2022-04-29 (×2): 6.25 mg via ORAL
  Filled 2022-04-29 (×2): qty 1

## 2022-04-29 MED ORDER — GABAPENTIN 100 MG PO CAPS
100.0000 mg | ORAL_CAPSULE | Freq: Once | ORAL | Status: AC
Start: 2022-04-29 — End: 2022-04-29
  Administered 2022-04-29: 100 mg via ORAL
  Filled 2022-04-29: qty 1

## 2022-04-29 MED ORDER — AMLODIPINE BESYLATE 10 MG PO TABS
10.0000 mg | ORAL_TABLET | Freq: Every day | ORAL | Status: DC
Start: 2022-04-29 — End: 2022-04-30
  Administered 2022-04-29 – 2022-04-30 (×2): 10 mg via ORAL
  Filled 2022-04-29 (×2): qty 1

## 2022-04-29 MED ORDER — SODIUM CHLORIDE 0.9 % IV BOLUS
1000.0000 mL | Freq: Once | INTRAVENOUS | Status: AC
  Administered 2022-04-29: 1000 mL via INTRAVENOUS

## 2022-04-29 NOTE — Progress Notes (Signed)
PROGRESS NOTE    Anthony Graham  [id number] DOB: 02/26/1996 DOA: 04/28/2022 PCP: Robert Bellow, PA-C   Brief Narrative: 26 year old with past medical history significant for diabetes type 1, gastroparesis, hypertension who presents with nausea vomiting, diarrhea and abdominal pain that is started 1 day and a half prior to admission.  He presented with flare of his gastroparesis.  He was also found to be hypertensive and AKI.  Assessment & Plan:   Principal Problem:   Gastroparesis due to DM [facility]) Active Problems:   Intractable nausea and vomiting   AKI (acute kidney injury) ([place])   HTN (hypertension)   Diabetes mellitus without complication (HCC)   Normocytic anemia   Gastroparesis  1-Gastroparesis in setting of Diabetes type 1: He presented with nausea, vomiting,  diarrhea and abdominal pain. May be precipitated by marijuana use Continue with IV fluids Continue with Schedule IV Reglan.  IV as needed Zofran Continue  IV Protonix. As needed fentanyl for pain. Diarrhea resolved, no further episode of vomiting since yesterday.  Plan to advance diet.    2-Diabetes type 1: Hyperglycemia , uncontrolled.  -He uses insulin pump at home. -Continue  with Semglee 15 units every 24 hours -SSI.    3-HTN Severe:  Continue with  PRN IV hydralazine.  Resume norvasc and coreg.   4-Tachycardia;  Continue with IV fluids.  Resume carvedilol./    5-AKI on CKD stage II:  Prior Cr Range 1.1--1.5 Acute AKI in setting hypovolemia, dehydration.  Continue with IV fluids.  Strict I and O.  Continue with IV fluids, cr at 1.9   6-Cough: Chest x ray negative   7-Leukocytosis; monitor chest x ray negative, UA negative for infection.    Estimated body mass index is 21.79 kg/m as calculated from the following:   Height as of this encounter: 5\' 6"  (1.676 m).   Weight as of this encounter: 61.2 kg.   DVT prophylaxis: heparin Code Status: full code Family Communication: care  discussed with patient Disposition Plan:  Status is: Observation The patient remains OBS appropriate and will d/c before 2 midnights.    Consultants:  none  Procedures:  none  Antimicrobials:  none  Subjective: He is feeling better, abdominal pain has improved.  He is willing to advanced diet . No diarrhea  Objective: Vitals:   04/28/22 2055 04/28/22 2228 04/29/22 0108 04/29/22 0521  BP: (!) 191/118 (!) 155/115 (!) 176/116 (!) 162/126  Pulse: (!) 125 (!) 117 (!) 112 (!) 102  Resp: 18  18 18   Temp: 98.5 F (36.9 C)  99.6 F (37.6 C) 98.5 F (36.9 C)  TempSrc: Oral  Oral Oral  SpO2: 100% 98% 99% 100%  Weight:      Height:        Intake/Output Summary (Last 24 hours) at 04/29/2022 0848 Last data filed at 04/29/2022 0500 Gross per 24 hour  Intake 960 ml  Output --  Net 960 ml   Filed Weights   04/28/22 0317  Weight: 61.2 kg    Examination:  General exam: Appears calm and comfortable  Respiratory system: Clear to auscultation. Respiratory effort normal. Cardiovascular system: S1 & S2 heard, RRR. No JVD, murmurs, rubs, gallops or clicks. No pedal edema. Gastrointestinal system: Abdomen is nondistended, soft and nontender. No organomegaly or masses felt. Normal bowel sounds heard. Central nervous system: Alert and oriented. No focal neurological deficits. Extremities: Symmetric 5 x 5 power.    Data Reviewed: I have personally reviewed following labs and imaging studies  CBC: Recent Labs  Lab 04/28/22 0349 04/29/22 0550  WBC 11.0* 18.1*  NEUTROABS 10.0*  --   HGB 12.7* 11.7*  HCT 37.6* 36.6*  MCV 77.4* 80.3  PLT 179 0000000   Basic Metabolic Panel: Recent Labs  Lab 04/28/22 0349 04/28/22 0618 04/28/22 1732 04/29/22 0550  NA 140 144  --  143  K 4.1 3.8  --  4.0  CL 100 109  --  110  CO2 22 24  --  23  GLUCOSE 413* 227*  --  158*  BUN 22* 21*  --  27*  CREATININE 2.16* 1.91*  --  1.98*  CALCIUM 9.6 8.6*  --  8.6*  MG  --   --  2.2  --     GFR: Estimated Creatinine Clearance: 49.8 mL/min (A) (by C-G formula based on SCr of 1.98 mg/dL (H)). Liver Function Tests: Recent Labs  Lab 04/28/22 0349 04/29/22 0550  AST 41 36  ALT 27 23  ALKPHOS 90 71  BILITOT 1.4* 0.9  PROT 8.1 6.8  ALBUMIN 4.5 3.7   Recent Labs  Lab 04/28/22 0349  LIPASE 24   No results for input(s): AMMONIA in the last 168 hours. Coagulation Profile: No results for input(s): INR, PROTIME in the last 168 hours. Cardiac Enzymes: No results for input(s): CKTOTAL, CKMB, CKMBINDEX, TROPONINI in the last 168 hours. BNP (last 3 results) No results for input(s): PROBNP in the last 8760 hours. HbA1C: No results for input(s): HGBA1C in the last 72 hours. CBG: Recent Labs  Lab 04/28/22 1231 04/28/22 1614 04/28/22 2056 04/29/22 0746  GLUCAP 472* 312* 173* 143*   Lipid Profile: No results for input(s): CHOL, HDL, LDLCALC, TRIG, CHOLHDL, LDLDIRECT in the last 72 hours. Thyroid Function Tests: No results for input(s): TSH, T4TOTAL, FREET4, T3FREE, THYROIDAB in the last 72 hours. Anemia Panel: No results for input(s): VITAMINB12, FOLATE, FERRITIN, TIBC, IRON, RETICCTPCT in the last 72 hours. Sepsis Labs: No results for input(s): PROCALCITON, LATICACIDVEN in the last 168 hours.  No results found for this or any previous visit (from the past 240 hour(s)).       Radiology Studies: DG CHEST PORT 1 VIEW  Result Date: 04/28/2022 CLINICAL DATA:  Mild cough and weakness, initial encounter EXAM: PORTABLE CHEST 1 VIEW COMPARISON:  03/26/2021 FINDINGS: The heart size and mediastinal contours are within normal limits. Both lungs are clear. The visualized skeletal structures are unremarkable. IMPRESSION: No active disease. Electronically Signed   By: Inez Catalina M.D.   On: 04/28/2022 18:52        Scheduled Meds:  amLODipine  10 mg Oral Daily   carvedilol  6.25 mg Oral BID WC   heparin  5,000 Units Subcutaneous Q8H   insulin aspart  0-5 Units  Subcutaneous QHS   insulin aspart  0-9 Units Subcutaneous TID WC   insulin glargine-yfgn  15 Units Subcutaneous QHS   metoCLOPramide (REGLAN) injection  5 mg Intravenous Q8H   pantoprazole (PROTONIX) IV  40 mg Intravenous Q12H   Continuous Infusions:  sodium chloride 100 mL/hr at 04/28/22 1746     LOS: 0 days    Time spent: [place], MD [facility]   If 7PM-7AM, please contact night-coverage [email or web address]  04/29/2022, 8:48 AM

## 2022-04-30 DIAGNOSIS — E1143 Type 2 diabetes mellitus with diabetic autonomic (poly)neuropathy: Secondary | ICD-10-CM | POA: Diagnosis not present

## 2022-04-30 DIAGNOSIS — K3184 Gastroparesis: Secondary | ICD-10-CM | POA: Diagnosis not present

## 2022-04-30 LAB — BASIC METABOLIC PANEL
Anion gap: 9 (ref 5–15)
BUN: 20 mg/dL (ref 6–20)
CO2: 19 mmol/L — ABNORMAL LOW (ref 22–32)
Calcium: 8.2 mg/dL — ABNORMAL LOW (ref 8.9–10.3)
Chloride: 106 mmol/L (ref 98–111)
Creatinine, Ser: 1.77 mg/dL — ABNORMAL HIGH (ref 0.61–1.24)
GFR, Estimated: 54 mL/min — ABNORMAL LOW (ref >60)
Glucose, Bld: 220 mg/dL — ABNORMAL HIGH (ref 70–99)
Potassium: 4.1 mmol/L (ref 3.5–5.1)
Sodium: 134 mmol/L — ABNORMAL LOW (ref 135–145)

## 2022-04-30 LAB — GLUCOSE, CAPILLARY
Glucose-Capillary: 195 mg/dL — ABNORMAL HIGH (ref 70–99)
Glucose-Capillary: 144 mg/dL — ABNORMAL HIGH (ref 70–99)

## 2022-04-30 LAB — CBC
HCT: 37.2 % — ABNORMAL LOW (ref 39.0–52.0)
Hemoglobin: 12.4 g/dL — ABNORMAL LOW (ref 13.0–17.0)
MCH: 26.2 pg (ref 26.0–34.0)
MCHC: 33.3 g/dL (ref 30.0–36.0)
MCV: 78.5 fL — ABNORMAL LOW (ref 80.0–100.0)
Platelets: 163 10*3/uL (ref 150–400)
RBC: 4.74 MIL/uL (ref 4.22–5.81)
RDW: 13.2 % (ref 11.5–15.5)
WBC: 11.8 10*3/uL — ABNORMAL HIGH (ref 4.0–10.5)
nRBC: 0 % (ref 0.0–0.2)

## 2022-04-30 MED ORDER — CARVEDILOL 12.5 MG PO TABS: 12.5000 mg | ORAL_TABLET | Freq: Two times a day (BID) | ORAL | 3 refills | Status: DC

## 2022-04-30 MED ORDER — HYDRALAZINE HCL 10 MG PO TABS
10.0000 mg | ORAL_TABLET | Freq: Three times a day (TID) | ORAL | Status: DC
  Administered 2022-04-30: 10 mg via ORAL
  Filled 2022-04-30: qty 1

## 2022-04-30 MED ORDER — HYDRALAZINE HCL 25 MG PO TABS: 25.0000 mg | ORAL_TABLET | Freq: Three times a day (TID) | ORAL | 0 refills | Status: DC

## 2022-04-30 MED ORDER — HYDRALAZINE HCL 20 MG/ML IJ SOLN
10.0000 mg | Freq: Four times a day (QID) | INTRAMUSCULAR | Status: DC | PRN
  Administered 2022-04-30: 10 mg via INTRAVENOUS
  Filled 2022-04-30: qty 1

## 2022-04-30 MED ORDER — HYDRALAZINE HCL 10 MG PO TABS: 10.0000 mg | ORAL_TABLET | Freq: Three times a day (TID) | ORAL | 0 refills | Status: DC

## 2022-04-30 MED ORDER — PANTOPRAZOLE SODIUM 40 MG PO TBEC: 40.0000 mg | DELAYED_RELEASE_TABLET | Freq: Every day | ORAL | 1 refills | Status: DC

## 2022-04-30 MED ORDER — ONDANSETRON HCL 4 MG PO TABS: 4.0000 mg | ORAL_TABLET | Freq: Four times a day (QID) | ORAL | 0 refills | Status: AC | PRN

## 2022-04-30 MED ORDER — HYDRALAZINE HCL 25 MG PO TABS: 25.0000 mg | ORAL_TABLET | Freq: Three times a day (TID) | ORAL | Status: DC

## 2022-04-30 MED ORDER — METOCLOPRAMIDE HCL 5 MG PO TABS
5.0000 mg | ORAL_TABLET | Freq: Three times a day (TID) | ORAL | 0 refills | Status: DC
Start: 2022-04-30 — End: 2023-04-16

## 2022-04-30 MED ORDER — CARVEDILOL 12.5 MG PO TABS
12.5000 mg | ORAL_TABLET | Freq: Two times a day (BID) | ORAL | Status: DC
  Administered 2022-04-30: 12.5 mg via ORAL
  Filled 2022-04-30: qty 1

## 2022-04-30 NOTE — Progress Notes (Signed)
   04/30/22 0438  Vitals  Temp 98.1 F (36.7 C)  Temp Source Oral  BP (!) 167/117 (RN notified)  MAP (mmHg) 133  BP Location Left Arm  BP Method Automatic  Patient Position (if appropriate) Lying  Pulse Rate (!) 102  Pulse Rate Source Monitor  Resp 18  Level of Consciousness  Level of Consciousness Alert  MEWS COLOR  MEWS Score Color Green  Oxygen Therapy  SpO2 98 %  O2 Device Room Air  MEWS Score  MEWS Temp 0  MEWS Systolic 0  MEWS Pulse 1  MEWS RR 0  MEWS LOC 0  MEWS Score 1   Informed on call MD per order parameters.

## 2022-04-30 NOTE — Discharge Summary (Addendum)
Physician Discharge Summary   Patient: Anthony Graham MRN: [id number] DOB: 02-Nov-1996  Admit date:     04/28/2022  Discharge date: 04/30/22  Discharge Physician: Elmarie Shiley   PCP: Robert Bellow, PA-C   Recommendations at discharge:    Needs B-met to follow renal function.  Needs further adjustment of BP  Discharge Diagnoses: Principal Problem:   Gastroparesis due to DM [facility]) Active Problems:   Intractable nausea and vomiting   AKI (acute kidney injury) ([place])   HTN (hypertension)   Diabetes mellitus without complication (HCC)   Normocytic anemia   Gastroparesis  Resolved Problems:   * No resolved hospital problems. *  Hospital Course: 26 year old with past medical history significant for diabetes type 1, gastroparesis, hypertension who presents with nausea vomiting, diarrhea and abdominal pain that is started 1 day and a half prior to admission.  He presented with flare of his gastroparesis.  He was also found to be hypertensive and AKI.  Assessment and Plan:    1-Gastroparesis in setting of Diabetes type 1: He presented with nausea, vomiting,  diarrhea and abdominal pain. May be precipitated by marijuana use Continue with IV fluids Resume oral Reglan.  IV as needed Zofran Continue  IV Protonix. As needed fentanyl for pain. Diarrhea resolved, no further episode of vomiting since yesterday.  He is tolerating diet. He feels better. He wishes to go home.     2-Diabetes type 1: Hyperglycemia , uncontrolled.  -He uses insulin pump at home. -Continue  with Semglee 15 units every 24 hours -SSI.   resume insulin pump at discharge.   3-HTN Severe:  Continue with  PRN IV hydralazine.  Continue with Norvasc and coreg.  Increase coreg to 12.5.  Hold lisinopril due to AKI.  Start  hydralazine. Discharge on 25 mg   4-Tachycardia;  Continue with IV fluids.  Resume carvedilol./  increase carvedilol to 12.5  Improved.   5-AKI on CKD stage II:  Prior Cr Range  1.1--1.5 Acute AKI in setting hypovolemia, dehydration.  Treated  with IV fluids.  Cr down to 1.7 improved.  Patient wishing to go home. He will keep himself hydrated.  Hold lisinopril     6-Cough: Chest x ray negative   7-Leukocytosis; monitor chest x ray negative, UA negative for infection.   decreased to 11.          Consultants: None Procedures performed: None Disposition: Home Diet recommendation:  Discharge Diet Orders (From admission, onward)     Start     Ordered   04/30/22 0000  Diet - low sodium heart healthy        04/30/22 0840           Carb modified diet DISCHARGE MEDICATION: Allergies as of 04/30/2022       Reactions   Apple Juice Itching   Banana Itching   Gramineae Pollens    Other reaction(s): Other (See Comments) "Sore throat, runny nose, sneezing"        Medication List     STOP taking these medications    lisinopril 20 MG tablet Commonly known as: ZESTRIL   prednisoLONE acetate 1 % ophthalmic suspension Commonly known as: PRED FORTE       TAKE these medications    amLODipine 10 MG tablet Commonly known as: NORVASC Take 1 tablet (10 mg total) by mouth daily.   Baqsimi One Pack 3 MG/DOSE Powd Generic drug: Glucagon Place 1 spray into the nose as needed.   carvedilol 12.5 MG tablet  Commonly known as: COREG Take 1 tablet (12.5 mg total) by mouth 2 (two) times daily with a meal. What changed:  medication strength how much to take   ergocalciferol 1.25 MG (50000 UT) capsule Commonly known as: VITAMIN D2 Take 50,000 Units by mouth once a week. Friday's   hydrALAZINE 25 MG tablet Commonly known as: APRESOLINE Take 1 tablet (25 mg total) by mouth every 8 (eight) hours.   insulin aspart 100 UNIT/ML FlexPen Commonly known as: NOVOLOG Inject 90 Units into the skin continuous. Via insulin pump   ketorolac 0.5 % ophthalmic solution Commonly known as: ACULAR Place 1 drop into the right eye 4 (four) times daily.    Medical Compression Stockings Misc Dispense 1 pair of knee-high compression stockings, to be used when you are continuously standing   metoCLOPramide 5 MG tablet Commonly known as: Reglan Take 1 tablet (5 mg total) by mouth 3 (three) times daily before meals.   ondansetron 4 MG tablet Commonly known as: Zofran Take 1 tablet (4 mg total) by mouth every 6 (six) hours as needed for nausea or vomiting.   pantoprazole 40 MG tablet Commonly known as: PROTONIX Take 1 tablet (40 mg total) by mouth daily.        Discharge Exam: Filed Weights   04/28/22 0317  Weight: 61.2 kg   General; NAD Lung; CTA   Condition at discharge: stable  The results of significant diagnostics from this hospitalization (including imaging, microbiology, ancillary and laboratory) are listed below for reference.   Imaging Studies: DG CHEST PORT 1 VIEW  Result Date: 04/28/2022 CLINICAL DATA:  Mild cough and weakness, initial encounter EXAM: PORTABLE CHEST 1 VIEW COMPARISON:  03/26/2021 FINDINGS: The heart size and mediastinal contours are within normal limits. Both lungs are clear. The visualized skeletal structures are unremarkable. IMPRESSION: No active disease. Electronically Signed   By: Inez Catalina M.D.   On: 04/28/2022 18:52    Microbiology: Results for orders placed or performed during the hospital encounter of 12/07/21  Resp Panel by RT-PCR (Flu A&B, Covid) Nasopharyngeal Swab     Status: None   Collection Time: 12/07/21  7:53 PM   Specimen: Nasopharyngeal Swab; Nasopharyngeal(NP) swabs in vial transport medium  Result Value Ref Range Status   SARS Coronavirus 2 by RT PCR NEGATIVE NEGATIVE Final    Comment: (NOTE) SARS-CoV-2 target nucleic acids are NOT DETECTED.  The SARS-CoV-2 RNA is generally detectable in upper respiratory specimens during the acute phase of infection. The lowest concentration of SARS-CoV-2 viral copies this assay can detect is 138 copies/mL. A negative result does not  preclude SARS-Cov-2 infection and should not be used as the sole basis for treatment or other patient management decisions. A negative result may occur with  improper specimen collection/handling, submission of specimen other than nasopharyngeal swab, presence of viral mutation(s) within the areas targeted by this assay, and inadequate number of viral copies(<138 copies/mL). A negative result must be combined with clinical observations, patient history, and epidemiological information. The expected result is Negative.  Fact Sheet for Patients:  EntrepreneurPulse.com.au  Fact Sheet for Healthcare Providers:  IncredibleEmployment.be  This test is no t yet approved or cleared by the [place] FDA and  has been authorized for detection and/or diagnosis of SARS-CoV-2 by FDA under an Emergency Use Authorization (EUA). This EUA will remain  in effect (meaning this test can be used) for the duration of the COVID-19 declaration under Section 564(b)(1) of the Act, 21 U.S.C.section 360bbb-3(b)(1), unless the authorization is  terminated  or revoked sooner.       Influenza A by PCR NEGATIVE NEGATIVE Final   Influenza B by PCR NEGATIVE NEGATIVE Final    Comment: (NOTE) The Xpert Xpress SARS-CoV-2/FLU/RSV plus assay is intended as an aid in the diagnosis of influenza from Nasopharyngeal swab specimens and should not be used as a sole basis for treatment. Nasal washings and aspirates are unacceptable for Xpert Xpress SARS-CoV-2/FLU/RSV testing.  Fact Sheet for Patients: EntrepreneurPulse.com.au  Fact Sheet for Healthcare Providers: IncredibleEmployment.be  This test is not yet approved or cleared by the [place] FDA and has been authorized for detection and/or diagnosis of SARS-CoV-2 by FDA under an Emergency Use Authorization (EUA). This EUA will remain in effect (meaning this test can be used) for the  duration of the COVID-19 declaration under Section 564(b)(1) of the Act, 21 U.S.C. section 360bbb-3(b)(1), unless the authorization is terminated or revoked.  Performed at [facility], [address] [place], [place] [address]     Labs: CBC: Recent Labs  Lab 04/28/22 0349 04/29/22 0550 04/30/22 0721  WBC 11.0* 18.1* 11.8*  NEUTROABS 10.0*  --   --   HGB 12.7* 11.7* 12.4*  HCT 37.6* 36.6* 37.2*  MCV 77.4* 80.3 78.5*  PLT 179 176 358   Basic Metabolic Panel: Recent Labs  Lab 04/28/22 0349 04/28/22 0618 04/28/22 1732 04/29/22 0550 04/30/22 0721  NA 140 144  --  143 134*  K 4.1 3.8  --  4.0 4.1  CL 100 109  --  110 106  CO2 22 24  --  23 19*  GLUCOSE 413* 227*  --  158* 220*  BUN 22* 21*  --  27* 20  CREATININE 2.16* 1.91*  --  1.98* 1.77*  CALCIUM 9.6 8.6*  --  8.6* 8.2*  MG  --   --  2.2  --   --    Liver Function Tests: Recent Labs  Lab 04/28/22 0349 04/29/22 0550  AST 41 36  ALT 27 23  ALKPHOS 90 71  BILITOT 1.4* 0.9  PROT 8.1 6.8  ALBUMIN 4.5 3.7   CBG: Recent Labs  Lab 04/29/22 1241 04/29/22 1616 04/29/22 1935 04/30/22 0732 04/30/22 1110  GLUCAP 162* 329* 206* 195* 144*    Discharge time spent: greater than 30 minutes.  Signed: Elmarie Shiley, MD [facility] 04/30/2022

## 2022-04-30 NOTE — TOC CM/SW Note (Signed)
  Transition of Care [facility]) Screening Note   Patient Details  Name: Anthony Graham Date of Birth: 1996/09/28   [facility]) CM/SW Contact:    Darleene Cleaver, LCSW Phone Number: 04/30/2022, 12:22 PM    Transition of Care Department [facility]) has reviewed patient and no TOC needs have been identified at this time. We will continue to monitor patient advancement through interdisciplinary progression rounds. If new patient transition needs arise, please place a TOC consult.

## 2022-04-30 NOTE — Progress Notes (Signed)
Recardo Evangelist to be D/C'd Home per MD order.  Discussed with the patient and all questions fully answered.  VSS, Skin clean, dry and intact without evidence of skin break down, no evidence of skin tears noted. IV catheter discontinued intact. Site without signs and symptoms of complications. Dressing and pressure applied.  An After Visit Summary was printed and given to the patient. Patient prescriptions sent to pharmacy.  D/c education completed with patient/family including follow up instructions, medication list, d/c activities limitations if indicated, with other d/c instructions as indicated by MD - patient able to verbalize understanding, all questions fully answered.   Patient instructed to return to ED, call 911, or call MD for any changes in condition.   Patient escorted via WC, and D/C home via private auto.  Pauletta Browns 04/30/2022 3:20 PM

## 2022-08-02 ENCOUNTER — Emergency Department (HOSPITAL_BASED_OUTPATIENT_CLINIC_OR_DEPARTMENT_OTHER): Payer: BLUE CROSS/BLUE SHIELD

## 2022-08-02 ENCOUNTER — Encounter (HOSPITAL_BASED_OUTPATIENT_CLINIC_OR_DEPARTMENT_OTHER): Payer: Self-pay

## 2022-08-02 ENCOUNTER — Emergency Department (HOSPITAL_BASED_OUTPATIENT_CLINIC_OR_DEPARTMENT_OTHER)
Admission: EM | Admit: 2022-08-02 | Discharge: 2022-08-02 | Disposition: A | Discharge status: Home / Self Care | Payer: BLUE CROSS/BLUE SHIELD | Attending: Emergency Medicine | Admitting: Emergency Medicine

## 2022-08-02 ENCOUNTER — Other Ambulatory Visit: Payer: Self-pay

## 2022-08-02 DIAGNOSIS — Z23 Encounter for immunization: Secondary | ICD-10-CM | POA: Diagnosis not present

## 2022-08-02 DIAGNOSIS — I10 Essential (primary) hypertension: Secondary | ICD-10-CM | POA: Insufficient documentation

## 2022-08-02 DIAGNOSIS — E119 Type 2 diabetes mellitus without complications: Secondary | ICD-10-CM | POA: Insufficient documentation

## 2022-08-02 DIAGNOSIS — Z794 Long term (current) use of insulin: Secondary | ICD-10-CM | POA: Insufficient documentation

## 2022-08-02 DIAGNOSIS — S92424A Nondisplaced fracture of distal phalanx of right great toe, initial encounter for closed fracture: Secondary | ICD-10-CM | POA: Insufficient documentation

## 2022-08-02 DIAGNOSIS — S99921A Unspecified injury of right foot, initial encounter: Secondary | ICD-10-CM | POA: Diagnosis present

## 2022-08-02 DIAGNOSIS — F17290 Nicotine dependence, other tobacco product, uncomplicated: Secondary | ICD-10-CM | POA: Insufficient documentation

## 2022-08-02 DIAGNOSIS — S50311A Abrasion of right elbow, initial encounter: Secondary | ICD-10-CM | POA: Insufficient documentation

## 2022-08-02 MED ORDER — IBUPROFEN 600 MG PO TABS: 600.0000 mg | ORAL_TABLET | Freq: Four times a day (QID) | ORAL | 0 refills | Status: DC | PRN

## 2022-08-02 MED ORDER — TETANUS-DIPHTH-ACELL PERTUSSIS 5-2.5-18.5 LF-MCG/0.5 IM SUSY
0.5000 mL | PREFILLED_SYRINGE | Freq: Once | INTRAMUSCULAR | Status: AC
  Administered 2022-08-02: 0.5 mL via INTRAMUSCULAR
  Filled 2022-08-02: qty 0.5

## 2022-08-02 MED ORDER — ACETAMINOPHEN 325 MG PO TABS
650.0000 mg | ORAL_TABLET | Freq: Once | ORAL | Status: AC
  Administered 2022-08-02: 650 mg via ORAL
  Filled 2022-08-02: qty 2

## 2022-08-02 MED ORDER — ACETAMINOPHEN 325 MG PO TABS: 650.0000 mg | ORAL_TABLET | Freq: Four times a day (QID) | ORAL | 0 refills | Status: DC | PRN

## 2022-08-02 NOTE — Discharge Instructions (Signed)
It was a pleasure caring for you today in the emergency department. ° °Please return to the emergency department for any worsening or worrisome symptoms. ° ° °
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
## 2023-04-09 ENCOUNTER — Encounter (HOSPITAL_BASED_OUTPATIENT_CLINIC_OR_DEPARTMENT_OTHER): Payer: Self-pay

## 2023-04-09 ENCOUNTER — Emergency Department (HOSPITAL_BASED_OUTPATIENT_CLINIC_OR_DEPARTMENT_OTHER)
Admission: EM | Admit: 2023-04-09 | Discharge: 2023-04-09 | Disposition: A | Discharge status: Home / Self Care | Payer: BLUE CROSS/BLUE SHIELD | Attending: Emergency Medicine | Admitting: Emergency Medicine

## 2023-04-09 ENCOUNTER — Other Ambulatory Visit: Payer: Self-pay

## 2023-04-09 DIAGNOSIS — R112 Nausea with vomiting, unspecified: Secondary | ICD-10-CM

## 2023-04-09 DIAGNOSIS — R109 Unspecified abdominal pain: Secondary | ICD-10-CM | POA: Diagnosis not present

## 2023-04-09 DIAGNOSIS — R7309 Other abnormal glucose: Secondary | ICD-10-CM | POA: Insufficient documentation

## 2023-04-09 LAB — URINALYSIS, MICROSCOPIC (REFLEX)

## 2023-04-09 LAB — COMPREHENSIVE METABOLIC PANEL
ALT: 33 U/L (ref 0–44)
AST: 35 U/L (ref 15–41)
Albumin: 4.4 g/dL (ref 3.5–5.0)
Alkaline Phosphatase: 84 U/L (ref 38–126)
Anion gap: 13 (ref 5–15)
BUN: 29 mg/dL — ABNORMAL HIGH (ref 6–20)
CO2: 24 mmol/L (ref 22–32)
Calcium: 9.5 mg/dL (ref 8.9–10.3)
Chloride: 102 mmol/L (ref 98–111)
Creatinine, Ser: 2.24 mg/dL — ABNORMAL HIGH (ref 0.61–1.24)
GFR, Estimated: 41 mL/min — ABNORMAL LOW (ref >60)
Glucose, Bld: 223 mg/dL — ABNORMAL HIGH (ref 70–99)
Potassium: 4.1 mmol/L (ref 3.5–5.1)
Sodium: 139 mmol/L (ref 135–145)
Total Bilirubin: 0.6 mg/dL (ref 0.3–1.2)
Total Protein: 8.4 g/dL — ABNORMAL HIGH (ref 6.5–8.1)

## 2023-04-09 LAB — CBC
HCT: 39.7 % (ref 39.0–52.0)
Hemoglobin: 13.2 g/dL (ref 13.0–17.0)
MCH: 25.7 pg — ABNORMAL LOW (ref 26.0–34.0)
MCHC: 33.2 g/dL (ref 30.0–36.0)
MCV: 77.4 fL — ABNORMAL LOW (ref 80.0–100.0)
Platelets: 202 10*3/uL (ref 150–400)
RBC: 5.13 MIL/uL (ref 4.22–5.81)
RDW: 13 % (ref 11.5–15.5)
WBC: 17 10*3/uL — ABNORMAL HIGH (ref 4.0–10.5)
nRBC: 0 % (ref 0.0–0.2)

## 2023-04-09 LAB — CBG MONITORING, ED: Glucose-Capillary: 204 mg/dL — ABNORMAL HIGH (ref 70–99)

## 2023-04-09 LAB — LIPASE, BLOOD: Lipase: 27 U/L (ref 11–51)

## 2023-04-09 LAB — URINALYSIS, ROUTINE W REFLEX MICROSCOPIC
Bilirubin Urine: NEGATIVE
Glucose, UA: >=500 mg/dL — AB
Ketones, ur: 15 mg/dL — AB
Leukocytes,Ua: NEGATIVE
Nitrite: NEGATIVE
Protein, ur: >=300 mg/dL — AB
Specific Gravity, Urine: 1.015 (ref 1.005–1.030)
pH: 5.5 (ref 5.0–8.0)

## 2023-04-09 MED ORDER — ONDANSETRON HCL 4 MG/2ML IJ SOLN
4.0000 mg | Freq: Once | INTRAMUSCULAR | Status: AC | PRN
  Administered 2023-04-09: 4 mg via INTRAVENOUS
  Filled 2023-04-09: qty 2

## 2023-04-09 MED ORDER — LACTATED RINGERS IV BOLUS
1000.0000 mL | Freq: Once | INTRAVENOUS | Status: AC
  Administered 2023-04-09: 1000 mL via INTRAVENOUS

## 2023-04-09 MED ORDER — DROPERIDOL 2.5 MG/ML IJ SOLN
2.5000 mg | Freq: Once | INTRAMUSCULAR | Status: AC
  Administered 2023-04-09: 2.5 mg via INTRAVENOUS
  Filled 2023-04-09: qty 2

## 2023-04-09 MED ORDER — FENTANYL CITRATE PF 50 MCG/ML IJ SOSY
50.0000 ug | PREFILLED_SYRINGE | INTRAMUSCULAR | Status: DC | PRN
  Administered 2023-04-09: 50 ug via INTRAVENOUS
  Filled 2023-04-09: qty 1

## 2023-04-09 NOTE — ED Provider Notes (Signed)
[facility] Provider Note   CSN: [id number] Arrival date & time: 04/09/23  1916     History Chief Complaint  Patient presents with   Abdominal Pain    HPI Anthony Graham is a 27 y.o. male presenting for chief complaint of abdominal pain.  He is a 27 year old male with a history of gastroparesis secondary to type 1 diabetes.  Here with his mother.  He he has tried Zofran and Reglan and rectal Phenergan per his mother.  Patient is uncertain what he is taken today.  Poor p.o. intake. Has a Powerade bottle at bedside and that is full and patient has a emesis bag that is also full.  Patient's recorded medical, surgical, social, medication list and allergies were reviewed in the Snapshot window as part of the initial history.   Review of Systems   Review of Systems  Constitutional:  Negative for chills and fever.  HENT:  Negative for ear pain and sore throat.   Eyes:  Negative for pain and visual disturbance.  Respiratory:  Negative for cough and shortness of breath.   Cardiovascular:  Negative for chest pain and palpitations.  Gastrointestinal:  Positive for abdominal pain, nausea and vomiting.  Genitourinary:  Negative for dysuria and hematuria.  Musculoskeletal:  Negative for arthralgias and back pain.  Skin:  Negative for color change and rash.  Neurological:  Negative for seizures and syncope.  All other systems reviewed and are negative.   Physical Exam Updated Vital Signs BP (!) 170/116 (BP Location: Left Arm)   Pulse (!) 117   Temp 98.8 F (37.1 C) (Oral)   Resp 18   Ht 5\' 6"  (1.676 m)   Wt 59 kg   SpO2 99%   BMI 20.98 kg/m  Physical Exam Vitals and nursing note reviewed.  Constitutional:      General: He is not in acute distress.    Appearance: He is well-developed.  HENT:     Head: Normocephalic and atraumatic.  Eyes:     Conjunctiva/sclera: Conjunctivae normal.  Cardiovascular:     Rate and Rhythm: Normal  rate and regular rhythm.     Heart sounds: No murmur heard. Pulmonary:     Effort: Pulmonary effort is normal. No respiratory distress.     Breath sounds: Normal breath sounds.  Abdominal:     Palpations: Abdomen is soft.     Tenderness: There is no abdominal tenderness.  Musculoskeletal:        General: No swelling.     Cervical back: Neck supple.  Skin:    General: Skin is warm and dry.     Capillary Refill: Capillary refill takes less than 2 seconds.  Neurological:     Mental Status: He is alert.  Psychiatric:        Mood and Affect: Mood normal.      ED Course/ Medical Decision Making/ A&P Clinical Course as of 04/09/23 2259  Mon Apr 09, 2023  2151 pH: 5.5 [CC]    Clinical Course User Index [CC] Glyn Ade, MD    Procedures Procedures   Medications Ordered in ED Medications  ondansetron [facility]) injection 4 mg (4 mg Intravenous Given 04/09/23 1952)  lactated ringers bolus 1,000 mL (1,000 mLs Intravenous New Bag/Given 04/09/23 2141)  droperidol (INAPSINE) 2.5 MG/ML injection 2.5 mg (2.5 mg Intravenous Given 04/09/23 2141)  Medical Decision Making:   Anthony Graham is a 27 y.o. male who presented to the ED today with abdominal pain,  detailed above.    Complete initial physical exam performed, notably the patient  was hemodynamically stable no acute distress.     Reviewed and confirmed nursing documentation for past medical history, family history, social history.    Initial Assessment:   With the patient's presentation of abdominal pain, most likely diagnosis is nonspecific etiology/gastroparesis flare. Other diagnoses were considered including (but not limited to) gastroenteritis, colitis, small bowel obstruction, appendicitis, cholecystitis, pancreatitis, nephrolithiasis, UTI, pyleonephritis These are considered less likely due to history of present illness and physical exam findings.   This is most consistent with an acute life/limb threatening illness  complicated by underlying chronic conditions.   Initial Plan:  CBC/CMP to evaluate for underlying infectious/metabolic etiology for patient's abdominal pain  Lipase to evaluate for pancreatitis  EKG to evaluate for cardiac source of pain  Urinalysis and repeat physical assessment to evaluate for UTI/Pyelonpehritis  Empiric management of symptoms with escalating pain control and antiemetics as needed.   Initial Study Results:   Laboratory  All laboratory results reviewed without evidence of clinically relevant pathology.   Exceptions include: Slight uptrend of creatinine to 2.2 from 1.9, white count of 17   Final Reassessment and Plan:   Reevaluated at bedside after 4 hours in the emergency room.  After treatment with droperidol, patient has had complete symptomatic resolution and is now resting comfortably at bedside.  Powerade bottle appreciated earlier is now empty.  Patient feels comfortable discharge.  We discussed supportive care recommendations he already has Phenergan Reglan and Zofran at home and is hopeful that after IV fluids he historically gets better. Given symptomatic improvement I feel comfortable with outpatient care management follow-up with gastroenterology within 72 hours and strict return precautions for interval worsening.  Disposition:  I have considered need for hospitalization, however, considering all of the above, I believe this patient is stable for discharge at this time.  Patient/family educated about specific return precautions for given chief complaint and symptoms.  Patient/family educated about follow-up with PCP.     Patient/family expressed understanding of return precautions and need for follow-up. Patient spoken to regarding all imaging and laboratory results and appropriate follow up for these results. All education provided in verbal form with additional information in written form. Time was allowed for answering of patient questions. Patient discharged.     Emergency Department Medication Summary:   Medications  ondansetron [facility]) injection 4 mg (4 mg Intravenous Given 04/09/23 1952)  lactated ringers bolus 1,000 mL (1,000 mLs Intravenous New Bag/Given 04/09/23 2141)  droperidol (INAPSINE) 2.5 MG/ML injection 2.5 mg (2.5 mg Intravenous Given 04/09/23 2141)            Clinical Impression:  1. Nausea and vomiting, unspecified vomiting type      Discharge   Final Clinical Impression(s) / ED Diagnoses Final diagnoses:  Nausea and vomiting, unspecified vomiting type    Rx / DC Orders ED Discharge Orders     None         Glyn Ade, MD 04/09/23 2259

## 2023-04-09 NOTE — ED Triage Notes (Signed)
Pt has had ABD pain N/V/D for 2 days - hx of Gastroparesis

## 2023-04-13 ENCOUNTER — Other Ambulatory Visit: Payer: Self-pay

## 2023-04-13 ENCOUNTER — Emergency Department (HOSPITAL_BASED_OUTPATIENT_CLINIC_OR_DEPARTMENT_OTHER)
Admission: EM | Admit: 2023-04-13 | Discharge: 2023-04-13 | Disposition: A | Discharge status: Home / Self Care | Payer: BLUE CROSS/BLUE SHIELD | Attending: Emergency Medicine | Admitting: Emergency Medicine

## 2023-04-13 ENCOUNTER — Encounter (HOSPITAL_BASED_OUTPATIENT_CLINIC_OR_DEPARTMENT_OTHER): Payer: Self-pay

## 2023-04-13 DIAGNOSIS — K3184 Gastroparesis: Secondary | ICD-10-CM | POA: Insufficient documentation

## 2023-04-13 DIAGNOSIS — N179 Acute kidney failure, unspecified: Secondary | ICD-10-CM | POA: Diagnosis not present

## 2023-04-13 DIAGNOSIS — I10 Essential (primary) hypertension: Secondary | ICD-10-CM | POA: Diagnosis not present

## 2023-04-13 DIAGNOSIS — E109 Type 1 diabetes mellitus without complications: Secondary | ICD-10-CM | POA: Insufficient documentation

## 2023-04-13 DIAGNOSIS — R1110 Vomiting, unspecified: Secondary | ICD-10-CM | POA: Diagnosis present

## 2023-04-13 LAB — COMPREHENSIVE METABOLIC PANEL
ALT: 29 U/L (ref 0–44)
AST: 41 U/L (ref 15–41)
Albumin: 4 g/dL (ref 3.5–5.0)
Alkaline Phosphatase: 86 U/L (ref 38–126)
Anion gap: 16 — ABNORMAL HIGH (ref 5–15)
BUN: 41 mg/dL — ABNORMAL HIGH (ref 6–20)
CO2: 29 mmol/L (ref 22–32)
Calcium: 8.9 mg/dL (ref 8.9–10.3)
Chloride: 89 mmol/L — ABNORMAL LOW (ref 98–111)
Creatinine, Ser: 3.32 mg/dL — ABNORMAL HIGH (ref 0.61–1.24)
GFR, Estimated: 25 mL/min — ABNORMAL LOW (ref >60)
Glucose, Bld: 311 mg/dL — ABNORMAL HIGH (ref 70–99)
Potassium: 3.7 mmol/L (ref 3.5–5.1)
Sodium: 134 mmol/L — ABNORMAL LOW (ref 135–145)
Total Bilirubin: 1.8 mg/dL — ABNORMAL HIGH (ref 0.3–1.2)
Total Protein: 7.9 g/dL (ref 6.5–8.1)

## 2023-04-13 LAB — CBC
HCT: 47 % (ref 39.0–52.0)
Hemoglobin: 15.6 g/dL (ref 13.0–17.0)
MCH: 26 pg (ref 26.0–34.0)
MCHC: 33.2 g/dL (ref 30.0–36.0)
MCV: 78.2 fL — ABNORMAL LOW (ref 80.0–100.0)
Platelets: 131 10*3/uL — ABNORMAL LOW (ref 150–400)
RBC: 6.01 MIL/uL — ABNORMAL HIGH (ref 4.22–5.81)
RDW: 12.9 % (ref 11.5–15.5)
WBC: 7.7 10*3/uL (ref 4.0–10.5)
nRBC: 0 % (ref 0.0–0.2)

## 2023-04-13 LAB — CBG MONITORING, ED: Glucose-Capillary: 291 mg/dL — ABNORMAL HIGH (ref 70–99)

## 2023-04-13 LAB — LIPASE, BLOOD: Lipase: 33 U/L (ref 11–51)

## 2023-04-13 MED ORDER — SODIUM CHLORIDE 0.9 % IV BOLUS
1000.0000 mL | Freq: Once | INTRAVENOUS | Status: AC
  Administered 2023-04-13: 1000 mL via INTRAVENOUS

## 2023-04-13 MED ORDER — HYDROMORPHONE HCL 1 MG/ML IJ SOLN
1.0000 mg | Freq: Once | INTRAMUSCULAR | Status: AC
  Administered 2023-04-13: 1 mg via INTRAVENOUS
  Filled 2023-04-13: qty 1

## 2023-04-13 MED ORDER — ONDANSETRON HCL 4 MG/2ML IJ SOLN
4.0000 mg | Freq: Once | INTRAMUSCULAR | Status: AC
  Administered 2023-04-13: 4 mg via INTRAVENOUS
  Filled 2023-04-13: qty 2

## 2023-04-13 NOTE — ED Triage Notes (Signed)
Vomiting constantly since last encounter on 4/29. Diarrhea has stopped.   T1DM with gastroparesis.  Cbg 291. Dexcom sensor on right arm. Omnipod pump on left arm.

## 2023-04-13 NOTE — ED Provider Notes (Signed)
[facility] Emergency Department Provider Note MRN:  [id number]  Arrival date & time: 04/13/23     Chief Complaint   Vomiting   History of Present Illness   Anthony Graham is a 27 y.o. year-old male with a history of type 1 diabetes, gastroparesis presenting to the ED with chief complaint of vomiting.  Persistent nausea vomiting and diffuse abdominal pain for the past several days, home medications not helping.  Denies fever, cannot keep anything down.  Review of Systems  A thorough review of systems was obtained and all systems are negative except as noted in the HPI and PMH.   Patient's Health History    Past Medical History:  Diagnosis Date   Diabetes mellitus without complication (HCC)    Gastroparesis    HTN (hypertension)     Past Surgical History:  Procedure Laterality Date   ESOPHAGOGASTRODUODENOSCOPY     EYE SURGERY      Family History  Problem Relation Age of Onset   Diabetes Mother    Diabetes Other     Social History   Socioeconomic History   Marital status: Single    Spouse name: Not on file   Number of children: Not on file   Years of education: Not on file   Highest education level: Not on file  Occupational History   Not on file  Tobacco Use   Smoking status: Never    Passive exposure: Never   Smokeless tobacco: Never  Vaping Use   Vaping Use: Every day   Substances: Nicotine  Substance and Sexual Activity   Alcohol use: Not Currently    Comment: occ   Drug use: Yes    Types: Marijuana    Comment: daily since 2017   Sexual activity: Not Currently  Other Topics Concern   Not on file  Social History Narrative   Not on file   Social Determinants of Health   Financial Resource Strain: Not on file  Food Insecurity: Not on file  Transportation Needs: Not on file  Physical Activity: Not on file  Stress: Not on file  Social Connections: Not on file  Intimate Partner Violence: Not on file     Physical  Exam   Vitals:   04/13/23 0023  BP: (!) 168/121  Pulse: (!) 106  Resp: 16  Temp: 98.4 F (36.9 C)  SpO2: 100%    CONSTITUTIONAL: Well-appearing, NAD NEURO/PSYCH:  Alert and oriented x 3, no focal deficits EYES:  eyes equal and reactive ENT/NECK:  no LAD, no JVD CARDIO: Tachycardic rate, well-perfused, normal S1 and S2 PULM:  CTAB no wheezing or rhonchi GI/GU:  non-distended, non-tender MSK/SPINE:  No gross deformities, no edema SKIN:  no rash, atraumatic   *Additional and/or pertinent findings included in MDM below  Diagnostic and Interventional Summary    EKG Interpretation  Date/Time:    Ventricular Rate:    PR Interval:    QRS Duration:   QT Interval:    QTC Calculation:   R Axis:     Text Interpretation:         Labs Reviewed  CBC - Abnormal; Notable for the following components:      Result Value   RBC 6.01 (*)    MCV 78.2 (*)    Platelets 131 (*)    All other components within normal limits  COMPREHENSIVE METABOLIC PANEL - Abnormal; Notable for the following components:   Sodium 134 (*)    Chloride 89 (*)  Glucose, Bld 311 (*)    BUN 41 (*)    Creatinine, Ser 3.32 (*)    Total Bilirubin 1.8 (*)    GFR, Estimated 25 (*)    Anion gap 16 (*)    All other components within normal limits  CBG MONITORING, ED - Abnormal; Notable for the following components:   Glucose-Capillary 291 (*)    All other components within normal limits  LIPASE, BLOOD    No orders to display    Medications  HYDROmorphone (DILAUDID) injection 1 mg (1 mg Intravenous Given 04/13/23 0114)  ondansetron (ZOFRAN) injection 4 mg (4 mg Intravenous Given 04/13/23 0114)  sodium chloride 0.9 % bolus 1,000 mL ( Intravenous Stopped 04/13/23 0215)  sodium chloride 0.9 % bolus 1,000 mL ( Intravenous Stopped 04/13/23 0318)     Procedures  /  Critical Care Procedures  ED Course and Medical Decision Making  Initial Impression and Ddx Mildly tachycardic but otherwise reassuring vital signs,  but hypertensive in the setting of discomfort, abdomen is largely soft and nontender.  Suspect gastroparesis flare, DKA is also consideration.  Providing symptomatic management and checking labs.  Past medical/surgical history that increases complexity of ED encounter: Type 1 diabetes, gastroparesis  Interpretation of Diagnostics I personally reviewed the laboratory assessment and my interpretation is as follows: Acute kidney injury noted, no signs of DKA    Patient Reassessment and Ultimate Disposition/Management     Patient's symptoms are resolved, he would like to go home.  We discussed the acute kidney injury.  We observed him for a few more hours to ensure symptoms would not return.  He is tolerating p.o. comfortably, was given 2 L of crystalloid.  Suspect this AKI will recover with further hydration, very strict return precautions provided.  Patient management required discussion with the following services or consulting groups:  None  Complexity of Problems Addressed Acute illness or injury that poses threat of life of bodily function  Additional Data Reviewed and Analyzed Further history obtained from: Further history from spouse/family member  Additional Factors Impacting ED Encounter Risk Use of parenteral controlled substances and Consideration of hospitalization  [name]. Pilar Plate, MD [facility] Emergency Medicine [facility] [email or web address]  Final Clinical Impressions(s) / ED Diagnoses     ICD-10-CM   1. Gastroparesis  K31.84     2. Acute kidney injury (HCC)  N17.9       ED Discharge Orders     None        Discharge Instructions Discussed with and Provided to Patient:     Discharge Instructions      You were evaluated in the Emergency Department and after careful evaluation, we did not find any emergent condition requiring admission or further testing in the hospital.  Your exam/testing today is overall reassuring.  Recommend  following up with your primary care doctor for a recheck of your kidney function as we discussed.  Continue plenty of fluids and rest at home.  Please return to the Emergency Department if you experience any worsening of your condition.   Thank you for allowing [place] to be a part of your care.       Sabas Sous, MD 04/13/23 [phone]

## 2023-04-13 NOTE — Discharge Instructions (Addendum)
You were evaluated in the Emergency Department and after careful evaluation, we did not find any emergent condition requiring admission or further testing in the hospital.  Your exam/testing today is overall reassuring.  Recommend following up with your primary care doctor for a recheck of your kidney function as we discussed.  Continue plenty of fluids and rest at home.  Please return to the Emergency Department if you experience any worsening of your condition.   Thank you for allowing [place] to be a part of your care.

## 2023-04-15 ENCOUNTER — Encounter (HOSPITAL_BASED_OUTPATIENT_CLINIC_OR_DEPARTMENT_OTHER): Payer: Self-pay | Admitting: Emergency Medicine

## 2023-04-15 ENCOUNTER — Emergency Department (HOSPITAL_BASED_OUTPATIENT_CLINIC_OR_DEPARTMENT_OTHER)
Admission: EM | Admit: 2023-04-15 | Discharge: 2023-04-16 | Disposition: A | Discharge status: Home / Self Care | Payer: BLUE CROSS/BLUE SHIELD | Attending: Emergency Medicine | Admitting: Emergency Medicine

## 2023-04-15 ENCOUNTER — Other Ambulatory Visit: Payer: Self-pay

## 2023-04-15 DIAGNOSIS — I10 Essential (primary) hypertension: Secondary | ICD-10-CM | POA: Insufficient documentation

## 2023-04-15 DIAGNOSIS — E1043 Type 1 diabetes mellitus with diabetic autonomic (poly)neuropathy: Secondary | ICD-10-CM | POA: Diagnosis not present

## 2023-04-15 DIAGNOSIS — Z79899 Other long term (current) drug therapy: Secondary | ICD-10-CM | POA: Insufficient documentation

## 2023-04-15 DIAGNOSIS — E860 Dehydration: Secondary | ICD-10-CM

## 2023-04-15 DIAGNOSIS — R000 Tachycardia, unspecified: Secondary | ICD-10-CM | POA: Diagnosis not present

## 2023-04-15 DIAGNOSIS — Z794 Long term (current) use of insulin: Secondary | ICD-10-CM | POA: Insufficient documentation

## 2023-04-15 DIAGNOSIS — K3184 Gastroparesis: Secondary | ICD-10-CM

## 2023-04-15 DIAGNOSIS — R1110 Vomiting, unspecified: Secondary | ICD-10-CM | POA: Diagnosis present

## 2023-04-15 HISTORY — DX: Type 2 diabetes mellitus without complications: E11.9

## 2023-04-15 HISTORY — DX: Type 2 diabetes mellitus with unspecified diabetic retinopathy without macular edema: E11.319

## 2023-04-15 NOTE — ED Triage Notes (Signed)
Pt c/o ongoing vomiting since last ED encounter on 5/3. Reports hx of T1DM and gastroparesis.

## 2023-04-16 ENCOUNTER — Encounter (HOSPITAL_BASED_OUTPATIENT_CLINIC_OR_DEPARTMENT_OTHER): Payer: Self-pay | Admitting: Emergency Medicine

## 2023-04-16 DIAGNOSIS — E1043 Type 1 diabetes mellitus with diabetic autonomic (poly)neuropathy: Secondary | ICD-10-CM | POA: Diagnosis not present

## 2023-04-16 LAB — CBC WITH DIFFERENTIAL/PLATELET
Abs Immature Granulocytes: 0.04 10*3/uL (ref 0.00–0.07)
Basophils Absolute: 0 10*3/uL (ref 0.0–0.1)
Basophils Relative: 0 %
Eosinophils Absolute: 0.1 10*3/uL (ref 0.0–0.5)
Eosinophils Relative: 1 %
HCT: 43.3 % (ref 39.0–52.0)
Hemoglobin: 14.4 g/dL (ref 13.0–17.0)
Immature Granulocytes: 1 %
Lymphocytes Relative: 23 %
Lymphs Abs: 2 10*3/uL (ref 0.7–4.0)
MCH: 26 pg (ref 26.0–34.0)
MCHC: 33.3 g/dL (ref 30.0–36.0)
MCV: 78.2 fL — ABNORMAL LOW (ref 80.0–100.0)
Monocytes Absolute: 1.2 10*3/uL — ABNORMAL HIGH (ref 0.1–1.0)
Monocytes Relative: 14 %
Neutro Abs: 5.4 10*3/uL (ref 1.7–7.7)
Neutrophils Relative %: 61 %
Platelets: 166 10*3/uL (ref 150–400)
RBC: 5.54 MIL/uL (ref 4.22–5.81)
RDW: 13 % (ref 11.5–15.5)
WBC: 8.7 10*3/uL (ref 4.0–10.5)
nRBC: 0 % (ref 0.0–0.2)

## 2023-04-16 LAB — COMPREHENSIVE METABOLIC PANEL
ALT: 22 U/L (ref 0–44)
AST: 29 U/L (ref 15–41)
Albumin: 3.6 g/dL (ref 3.5–5.0)
Alkaline Phosphatase: 72 U/L (ref 38–126)
Anion gap: 14 (ref 5–15)
BUN: 28 mg/dL — ABNORMAL HIGH (ref 6–20)
CO2: 27 mmol/L (ref 22–32)
Calcium: 8.6 mg/dL — ABNORMAL LOW (ref 8.9–10.3)
Chloride: 95 mmol/L — ABNORMAL LOW (ref 98–111)
Creatinine, Ser: 2.6 mg/dL — ABNORMAL HIGH (ref 0.61–1.24)
GFR, Estimated: 34 mL/min — ABNORMAL LOW (ref >60)
Glucose, Bld: 205 mg/dL — ABNORMAL HIGH (ref 70–99)
Potassium: 3.4 mmol/L — ABNORMAL LOW (ref 3.5–5.1)
Sodium: 136 mmol/L (ref 135–145)
Total Bilirubin: 1.4 mg/dL — ABNORMAL HIGH (ref 0.3–1.2)
Total Protein: 7 g/dL (ref 6.5–8.1)

## 2023-04-16 LAB — URINALYSIS, MICROSCOPIC (REFLEX)

## 2023-04-16 LAB — RAPID URINE DRUG SCREEN, HOSP PERFORMED
Amphetamines: NOT DETECTED
Barbiturates: NOT DETECTED
Benzodiazepines: NOT DETECTED
Cocaine: NOT DETECTED
Opiates: NOT DETECTED
Tetrahydrocannabinol: POSITIVE — AB

## 2023-04-16 LAB — URINALYSIS, ROUTINE W REFLEX MICROSCOPIC
Bilirubin Urine: NEGATIVE
Glucose, UA: >=500 mg/dL — AB
Ketones, ur: 15 mg/dL — AB
Leukocytes,Ua: NEGATIVE
Nitrite: NEGATIVE
Protein, ur: >=300 mg/dL — AB
Specific Gravity, Urine: 1.02 (ref 1.005–1.030)
pH: 6.5 (ref 5.0–8.0)

## 2023-04-16 LAB — LIPASE, BLOOD: Lipase: 30 U/L (ref 11–51)

## 2023-04-16 MED ORDER — METOCLOPRAMIDE HCL 5 MG PO TABS: 5.0000 mg | ORAL_TABLET | Freq: Four times a day (QID) | ORAL | 0 refills | Status: DC | PRN

## 2023-04-16 MED ORDER — FENTANYL CITRATE PF 50 MCG/ML IJ SOSY: 50.0000 ug | PREFILLED_SYRINGE | Freq: Once | INTRAMUSCULAR | Status: DC | PRN

## 2023-04-16 MED ORDER — LACTATED RINGERS IV BOLUS
2000.0000 mL | Freq: Once | INTRAVENOUS | Status: AC
  Administered 2023-04-16: 1000 mL via INTRAVENOUS

## 2023-04-16 MED ORDER — METOCLOPRAMIDE HCL 5 MG/ML IJ SOLN
10.0000 mg | Freq: Once | INTRAMUSCULAR | Status: AC
  Administered 2023-04-16: 10 mg via INTRAVENOUS
  Filled 2023-04-16: qty 2

## 2023-04-16 MED ORDER — FENTANYL CITRATE PF 50 MCG/ML IJ SOSY
50.0000 ug | PREFILLED_SYRINGE | Freq: Once | INTRAMUSCULAR | Status: AC
  Administered 2023-04-16: 50 ug via INTRAVENOUS
  Filled 2023-04-16: qty 1

## 2023-04-16 NOTE — ED Provider Notes (Signed)
[facility] Provider Note: Lowella Dell, MD, FACEP  CSN: [id number] MRN: [id number] ARRIVAL: 04/15/23 at 2311 ROOM: MH12/MH12   CHIEF COMPLAINT  Vomiting   HISTORY OF PRESENT ILLNESS  04/16/23 12:26 AM Anthony Graham is a 27 y.o. male with a history of type 1 diabetes and gastroparesis.  He was seen on 04/13/2023 for several days of persistent nausea and vomiting and diffuse abdominal pain.  He was given a liter of normal saline, Zofran and hydromorphone and discharged with improvement.  It was noted that his creatinine had jumped to 3.32 (2.24 on 04/09/2023, 1.77 on 04/30/2022) and this acute kidney injury was thought to be due to dehydration.  He returns with ongoing vomiting since the previous visit.  He rates his abdominal pain as a 9 out of 10.  He has taken Zofran at home without relief.   Past Medical History:  Diagnosis Date   Diabetic retinopathy (HCC)    DM (diabetes mellitus) (HCC)    Gastroparesis    HTN (hypertension)     Past Surgical History:  Procedure Laterality Date   ESOPHAGOGASTRODUODENOSCOPY     EYE SURGERY      Family History  Problem Relation Age of Onset   Diabetes Mother    Diabetes Other     Social History   Tobacco Use   Smoking status: Never    Passive exposure: Never   Smokeless tobacco: Never  Vaping Use   Vaping Use: Every day   Substances: Nicotine  Substance Use Topics   Alcohol use: Not Currently    Comment: occ   Drug use: Yes    Types: Marijuana    Comment: daily since 2017    Prior to Admission medications   Medication Sig Start Date End Date Taking? Authorizing Provider  acetaminophen (TYLENOL) 325 MG tablet Take 2 tablets (650 mg total) by mouth every 6 (six) hours as needed. 08/02/22   Sloan Leiter, DO  amLODipine (NORVASC) 10 MG tablet Take 1 tablet (10 mg total) by mouth daily. 12/10/21   Almon Hercules, MD  carvedilol (COREG) 12.5 MG tablet Take 1 tablet (12.5 mg total) by mouth 2 (two) times daily with  a meal. 04/30/22   Regalado, Prentiss Bells, MD  Elastic Bandages & Supports (MEDICAL COMPRESSION STOCKINGS) MISC Dispense 1 pair of knee-high compression stockings, to be used when you are continuously standing 03/26/21   Pollina, Canary Brim, MD  ergocalciferol (VITAMIN D2) 1.25 MG (50000 UT) capsule Take 50,000 Units by mouth once a week. Friday's    [provider]  Glucagon (BAQSIMI ONE PACK) 3 MG/DOSE POWD Place 1 spray into the nose as needed. 02/07/21   [provider]  hydrALAZINE (APRESOLINE) 25 MG tablet Take 1 tablet (25 mg total) by mouth every 8 (eight) hours. 04/30/22   Regalado, Belkys A, MD  ibuprofen (ADVIL) 600 MG tablet Take 1 tablet (600 mg total) by mouth every 6 (six) hours as needed. 08/02/22   Sloan Leiter, DO  insulin aspart (NOVOLOG) 100 UNIT/ML FlexPen Inject 90 Units into the skin continuous. Via insulin pump 01/30/17   [provider]  ketorolac (ACULAR) 0.5 % ophthalmic solution Place 1 drop into the right eye 4 (four) times daily. 08/31/21   [provider]  metoCLOPramide (REGLAN) 5 MG tablet Take 1 tablet (5 mg total) by mouth every 6 (six) hours as needed for nausea or vomiting. 04/16/23 05/16/23  Keon Benscoter, MD  ondansetron (ZOFRAN) 4 MG tablet Take 1  tablet (4 mg total) by mouth every 6 (six) hours as needed for nausea or vomiting. 04/30/22 04/30/23  Regalado, Belkys A, MD  pantoprazole (PROTONIX) 40 MG tablet Take 1 tablet (40 mg total) by mouth daily. 04/30/22   Regalado, Belkys A, MD  potassium chloride SA (KLOR-CON) 20 MEQ tablet Take 1 tablet (20 mEq total) by mouth daily. 03/02/20 08/05/20  Petrucelli, Samantha R, PA-C    Allergies Apple juice, Banana, and Gramineae pollens   REVIEW OF SYSTEMS  Negative except as noted here or in the History of Present Illness.   PHYSICAL EXAMINATION  Initial Vital Signs Blood pressure (!) 150/105, pulse (!) 111, temperature 97.8 F (36.6 C), resp. rate 18, height 5\' 6"  (1.676 m), weight 59 kg,  SpO2 100 %.  Examination General: Well-developed, thin male in no acute distress; appearance consistent with age of record HENT: normocephalic; atraumatic Eyes: Normal appearance Neck: supple Heart: regular rate and rhythm; tachycardia Lungs: clear to auscultation bilaterally Abdomen: soft; nondistended; mild epigastric tenderness; bowel sounds present Extremities: No deformity; full range of motion; pulses normal Neurologic: Awake, alert and oriented; motor function intact in all extremities and symmetric; no facial droop Skin: Warm and dry Psychiatric: Flat affect   RESULTS  Summary of this visit's results, reviewed and interpreted by myself:   EKG Interpretation  Date/Time:  Monday Apr 16 2023 00:39:15 EDT Ventricular Rate:  97 PR Interval:  100 QRS Duration: 88 QT Interval:  367 QTC Calculation: 467 R Axis:   51 Text Interpretation: Sinus rhythm Short PR interval Probable left atrial enlargement RSR' in V1 or V2, probably normal variant ST elev, probable normal early repol pattern Confirmed by Paula Libra ([id number]) on 04/16/2023 12:43:25 AM       Laboratory Studies: Results for orders placed or performed during the hospital encounter of 04/15/23 (from the past 24 hour(s))  Lipase, blood     Status: None   Collection Time: 04/15/23 11:50 PM  Result Value Ref Range   Lipase 30 11 - 51 U/L  Comprehensive metabolic panel     Status: Abnormal   Collection Time: 04/15/23 11:50 PM  Result Value Ref Range   Sodium 136 135 - 145 mmol/L   Potassium 3.4 (L) 3.5 - 5.1 mmol/L   Chloride 95 (L) 98 - 111 mmol/L   CO2 27 22 - 32 mmol/L   Glucose, Bld 205 (H) 70 - 99 mg/dL   BUN 28 (H) 6 - 20 mg/dL   Creatinine, Ser 6.04 (H) 0.61 - 1.24 mg/dL   Calcium 8.6 (L) 8.9 - 10.3 mg/dL   Total Protein 7.0 6.5 - 8.1 g/dL   Albumin 3.6 3.5 - 5.0 g/dL   AST 29 15 - 41 U/L   ALT 22 0 - 44 U/L   Alkaline Phosphatase 72 38 - 126 U/L   Total Bilirubin 1.4 (H) 0.3 - 1.2 mg/dL   GFR, Estimated  34 (L) >60 mL/min   Anion gap 14 5 - 15  CBC with Differential     Status: Abnormal   Collection Time: 04/15/23 11:50 PM  Result Value Ref Range   WBC 8.7 4.0 - 10.5 K/uL   RBC 5.54 4.22 - 5.81 MIL/uL   Hemoglobin 14.4 13.0 - 17.0 g/dL   HCT 54.0 98.1 - 19.1 %   MCV 78.2 (L) 80.0 - 100.0 fL   MCH 26.0 26.0 - 34.0 pg   MCHC 33.3 30.0 - 36.0 g/dL   RDW 47.8 29.5 - 62.1 %   Platelets  166 150 - 400 K/uL   nRBC 0.0 0.0 - 0.2 %   Neutrophils Relative % 61 %   Neutro Abs 5.4 1.7 - 7.7 K/uL   Lymphocytes Relative 23 %   Lymphs Abs 2.0 0.7 - 4.0 K/uL   Monocytes Relative 14 %   Monocytes Absolute 1.2 (H) 0.1 - 1.0 K/uL   Eosinophils Relative 1 %   Eosinophils Absolute 0.1 0.0 - 0.5 K/uL   Basophils Relative 0 %   Basophils Absolute 0.0 0.0 - 0.1 K/uL   Immature Granulocytes 1 %   Abs Immature Granulocytes 0.04 0.00 - 0.07 K/uL  Rapid urine drug screen (hospital performed)     Status: Abnormal   Collection Time: 04/16/23  2:05 AM  Result Value Ref Range   Opiates NONE DETECTED NONE DETECTED   Cocaine NONE DETECTED NONE DETECTED   Benzodiazepines NONE DETECTED NONE DETECTED   Amphetamines NONE DETECTED NONE DETECTED   Tetrahydrocannabinol POSITIVE (A) NONE DETECTED   Barbiturates NONE DETECTED NONE DETECTED  Urinalysis, Routine w reflex microscopic -Urine, Clean Catch     Status: Abnormal   Collection Time: 04/16/23  2:05 AM  Result Value Ref Range   Color, Urine YELLOW YELLOW   APPearance CLEAR CLEAR   Specific Gravity, Urine 1.020 1.005 - 1.030   pH 6.5 5.0 - 8.0   Glucose, UA >=500 (A) NEGATIVE mg/dL   Hgb urine dipstick TRACE (A) NEGATIVE   Bilirubin Urine NEGATIVE NEGATIVE   Ketones, ur 15 (A) NEGATIVE mg/dL   Protein, ur >=536 (A) NEGATIVE mg/dL   Nitrite NEGATIVE NEGATIVE   Leukocytes,Ua NEGATIVE NEGATIVE  Urinalysis, Microscopic (reflex)     Status: Abnormal   Collection Time: 04/16/23  2:05 AM  Result Value Ref Range   RBC / HPF 0-5 0 - 5 RBC/hpf   WBC, UA 0-5 0 -  5 WBC/hpf   Bacteria, UA RARE (A) NONE SEEN   Squamous Epithelial / HPF 0-5 0 - 5 /HPF   Imaging Studies: No results found.  ED COURSE and MDM  Nursing notes, initial and subsequent vitals signs, including pulse oximetry, reviewed and interpreted by myself.  Vitals:   04/15/23 2320 04/15/23 2322 04/16/23 0145  BP:  (!) 150/105   Pulse:  (!) 111 96  Resp:  18 17  Temp:  97.8 F (36.6 C)   SpO2:  100% 99%  Weight: 59 kg    Height: 5\' 6"  (1.676 m)     Medications  fentaNYL (SUBLIMAZE) injection 50 mcg (has no administration in time range)  lactated ringers bolus 2,000 mL (0 mLs Intravenous Stopped 04/16/23 0122)  fentaNYL (SUBLIMAZE) injection 50 mcg (50 mcg Intravenous Given 04/16/23 0049)  metoCLOPramide (REGLAN) injection 10 mg (10 mg Intravenous Given 04/16/23 0047)   2:38 AM Patient's pain is well-controlled at this time and he has not been vomiting after Reglan.  He was given 2 L of LR.  His kidney function has improved since his previous visit.  He is no longer tachycardic and feels like he can be discharged home at this time.  He is positive for marijuana so it is unclear if this is pure diabetic gastroparesis or if there is an element of cannabinoid hyperemesis syndrome as well.  Since his home Zofran has not been working we will prescribe Reglan as he may get better relief with this.   PROCEDURES  Procedures   ED DIAGNOSES     ICD-10-CM   1. Gastroparesis  K31.84     2.  Dehydration  E86.0          Ovidio Steele, Jonny Ruiz, MD 04/16/23 [phone]

## 2023-04-16 NOTE — ED Notes (Signed)
Pt provided water for po challenge. Pt tolerating po intake at this time.

## 2023-04-16 NOTE — ED Notes (Signed)
Patient knows he needs to provide a UA

## 2023-06-10 IMAGING — DX DG ABDOMEN 1V
2 series · 2 of 2 positions shown · non-contrast
Comparison: None.

CLINICAL DATA: Gastroparesis

EXAM:
ABDOMEN - 1 VIEW

[abdomen kub (1 of 2)]
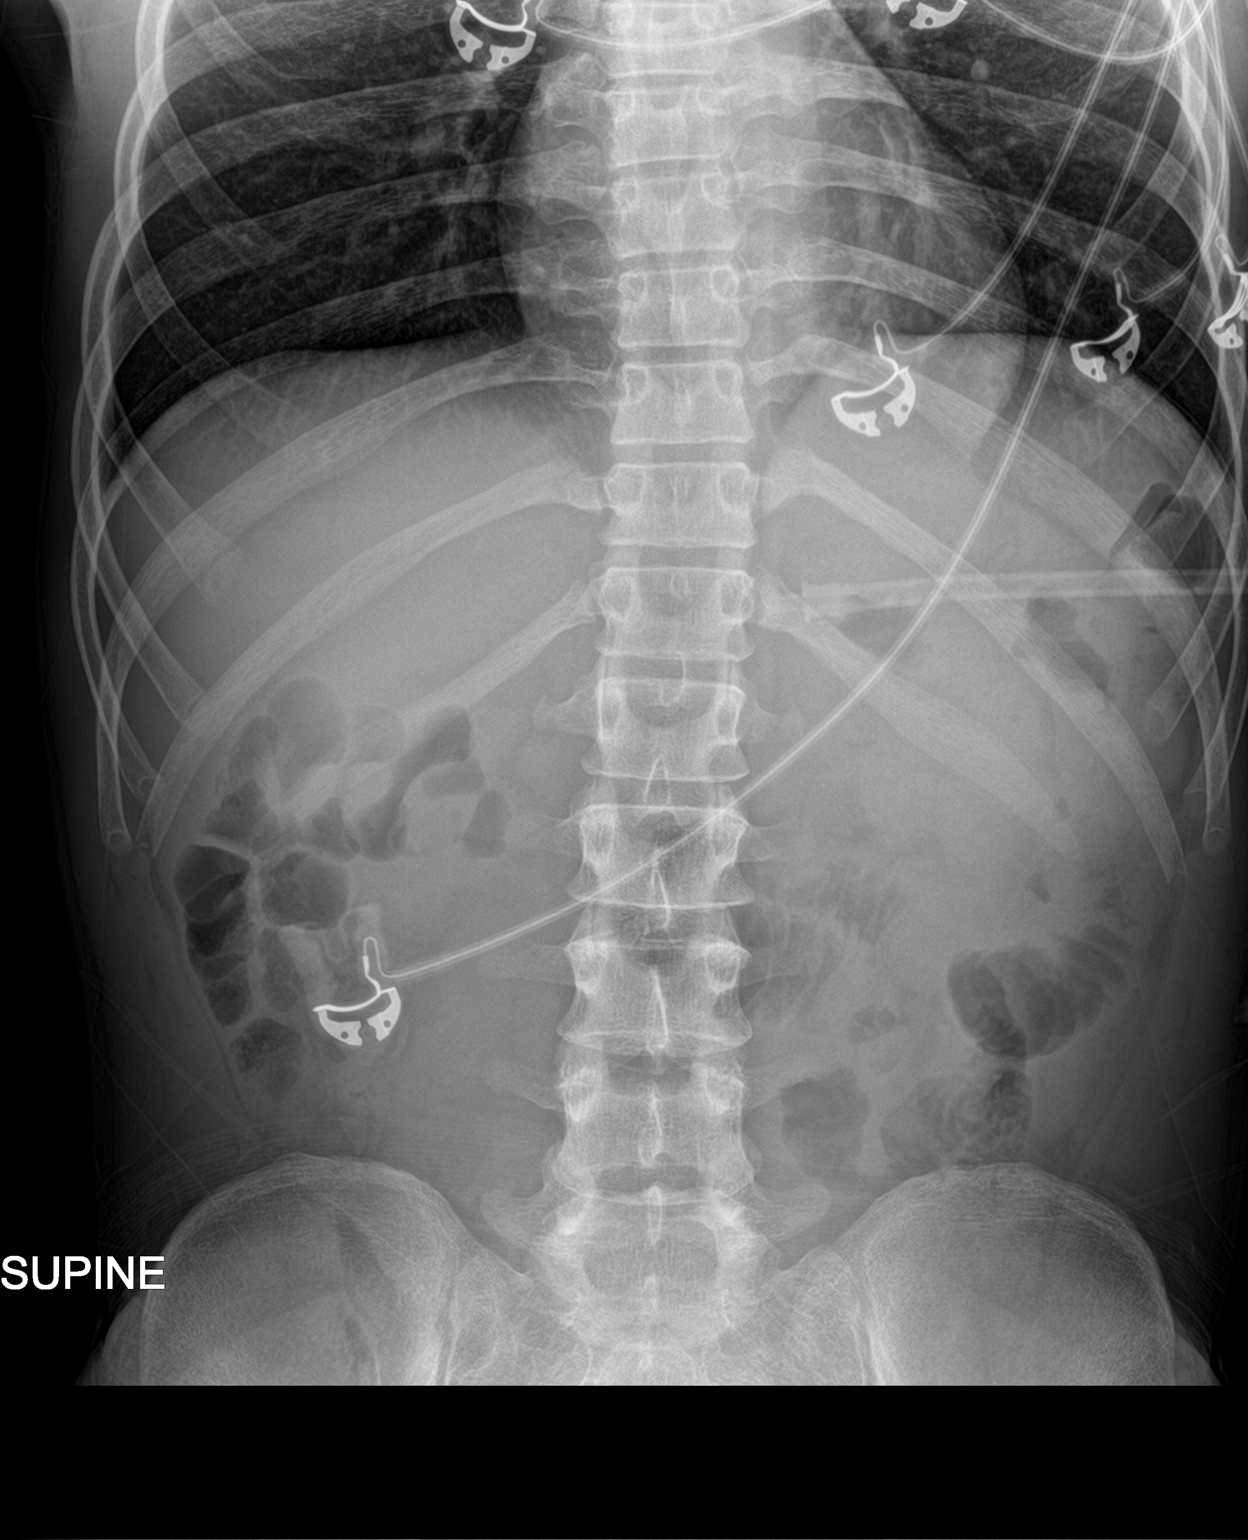

[abdomen kub (2 of 2)]
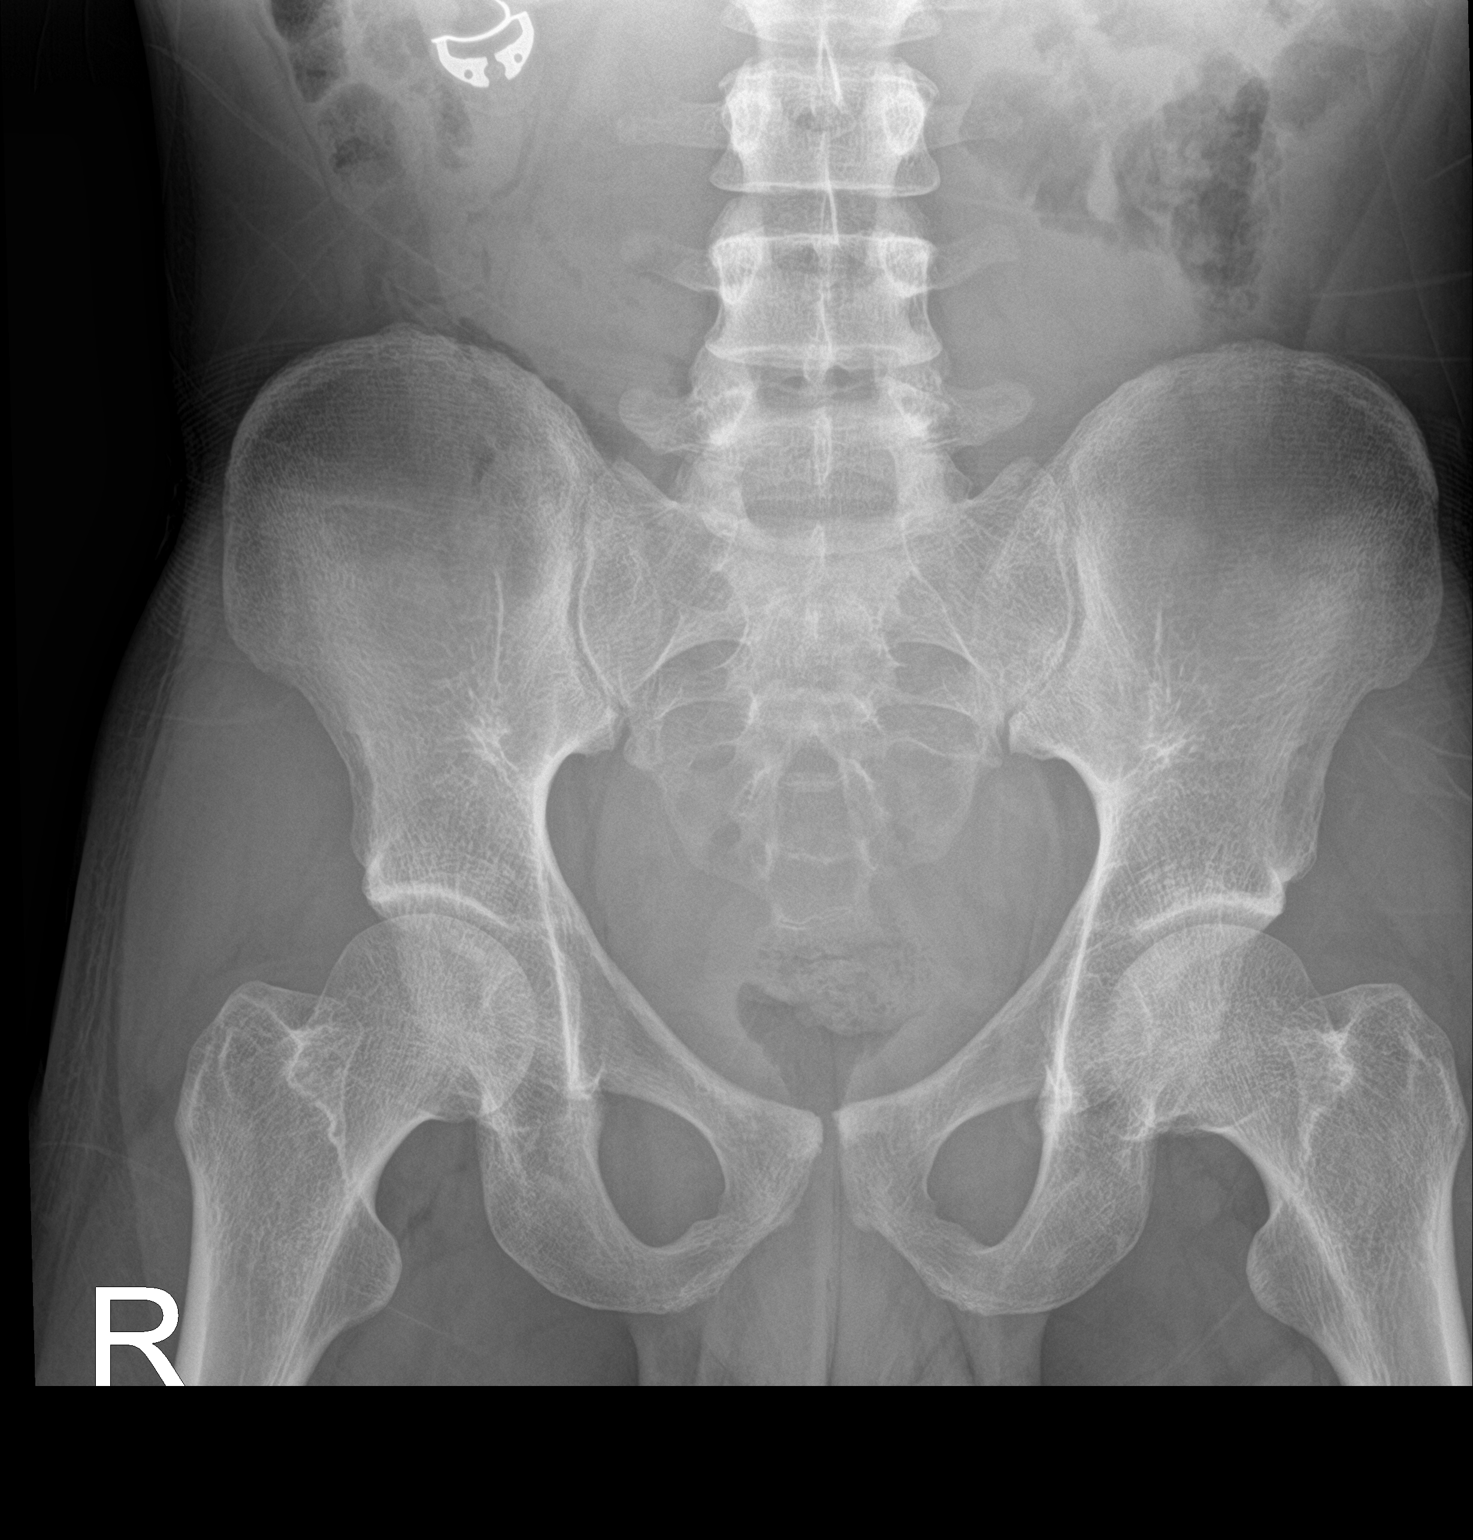

[2 of 2 positions shown; findings below may reference images not displayed]

FINDINGS: Borderline dilatation of small-bowel loops in the mid abdomen. No
radio-opaque calculi or other significant radiographic abnormality
are seen.
IMPRESSION: Borderline dilatation of small-bowel loops in the mid abdomen.

## 2023-09-18 ENCOUNTER — Other Ambulatory Visit: Payer: Self-pay

## 2023-09-18 ENCOUNTER — Emergency Department (HOSPITAL_BASED_OUTPATIENT_CLINIC_OR_DEPARTMENT_OTHER): Payer: BLUE CROSS/BLUE SHIELD

## 2023-09-18 ENCOUNTER — Encounter (HOSPITAL_BASED_OUTPATIENT_CLINIC_OR_DEPARTMENT_OTHER): Payer: Self-pay | Admitting: Emergency Medicine

## 2023-09-18 ENCOUNTER — Inpatient Hospital Stay (HOSPITAL_BASED_OUTPATIENT_CLINIC_OR_DEPARTMENT_OTHER)
Admission: EM | Admit: 2023-09-18 | Discharge: 2023-09-21 | DRG: 638 | Disposition: A | Discharge status: Home / Self Care | Payer: BLUE CROSS/BLUE SHIELD | Attending: Family Medicine | Admitting: Family Medicine

## 2023-09-18 DIAGNOSIS — I10 Essential (primary) hypertension: Secondary | ICD-10-CM | POA: Diagnosis present

## 2023-09-18 DIAGNOSIS — E1010 Type 1 diabetes mellitus with ketoacidosis without coma: Secondary | ICD-10-CM | POA: Diagnosis not present

## 2023-09-18 DIAGNOSIS — Z794 Long term (current) use of insulin: Secondary | ICD-10-CM

## 2023-09-18 DIAGNOSIS — T465X6A Underdosing of other antihypertensive drugs, initial encounter: Secondary | ICD-10-CM | POA: Diagnosis present

## 2023-09-18 DIAGNOSIS — E1022 Type 1 diabetes mellitus with diabetic chronic kidney disease: Secondary | ICD-10-CM | POA: Diagnosis present

## 2023-09-18 DIAGNOSIS — N184 Chronic kidney disease, stage 4 (severe): Secondary | ICD-10-CM | POA: Diagnosis present

## 2023-09-18 DIAGNOSIS — D72829 Elevated white blood cell count, unspecified: Secondary | ICD-10-CM | POA: Diagnosis not present

## 2023-09-18 DIAGNOSIS — D631 Anemia in chronic kidney disease: Secondary | ICD-10-CM | POA: Diagnosis present

## 2023-09-18 DIAGNOSIS — I160 Hypertensive urgency: Secondary | ICD-10-CM | POA: Diagnosis present

## 2023-09-18 DIAGNOSIS — K3184 Gastroparesis: Secondary | ICD-10-CM | POA: Diagnosis present

## 2023-09-18 DIAGNOSIS — Z91018 Allergy to other foods: Secondary | ICD-10-CM

## 2023-09-18 DIAGNOSIS — K760 Fatty (change of) liver, not elsewhere classified: Secondary | ICD-10-CM | POA: Diagnosis present

## 2023-09-18 DIAGNOSIS — N1832 Chronic kidney disease, stage 3b: Secondary | ICD-10-CM | POA: Diagnosis present

## 2023-09-18 DIAGNOSIS — N179 Acute kidney failure, unspecified: Secondary | ICD-10-CM | POA: Diagnosis present

## 2023-09-18 DIAGNOSIS — Z79899 Other long term (current) drug therapy: Secondary | ICD-10-CM

## 2023-09-18 DIAGNOSIS — E1043 Type 1 diabetes mellitus with diabetic autonomic (poly)neuropathy: Secondary | ICD-10-CM | POA: Diagnosis present

## 2023-09-18 DIAGNOSIS — F1290 Cannabis use, unspecified, uncomplicated: Secondary | ICD-10-CM | POA: Diagnosis present

## 2023-09-18 DIAGNOSIS — Z91198 Patient's noncompliance with other medical treatment and regimen for other reason: Secondary | ICD-10-CM

## 2023-09-18 DIAGNOSIS — I129 Hypertensive chronic kidney disease with stage 1 through stage 4 chronic kidney disease, or unspecified chronic kidney disease: Secondary | ICD-10-CM | POA: Diagnosis present

## 2023-09-18 DIAGNOSIS — R509 Fever, unspecified: Secondary | ICD-10-CM | POA: Diagnosis not present

## 2023-09-18 DIAGNOSIS — Z7151 Drug abuse counseling and surveillance of drug abuser: Secondary | ICD-10-CM

## 2023-09-18 DIAGNOSIS — Z833 Family history of diabetes mellitus: Secondary | ICD-10-CM

## 2023-09-18 DIAGNOSIS — Z9641 Presence of insulin pump (external) (internal): Secondary | ICD-10-CM | POA: Diagnosis present

## 2023-09-18 DIAGNOSIS — Z91148 Patient's other noncompliance with medication regimen for other reason: Secondary | ICD-10-CM

## 2023-09-18 DIAGNOSIS — Z1152 Encounter for screening for COVID-19: Secondary | ICD-10-CM

## 2023-09-18 LAB — URINALYSIS, W/ REFLEX TO CULTURE (INFECTION SUSPECTED)
Bilirubin Urine: NEGATIVE
Glucose, UA: >=500 mg/dL — AB
Ketones, ur: 15 mg/dL — AB
Leukocytes,Ua: NEGATIVE
Nitrite: NEGATIVE
Protein, ur: >=300 mg/dL — AB
Specific Gravity, Urine: 1.02 (ref 1.005–1.030)
WBC, UA: NONE SEEN WBC/hpf (ref 0–5)
pH: 5 (ref 5.0–8.0)

## 2023-09-18 LAB — I-STAT VENOUS BLOOD GAS, ED
Acid-base deficit: 6 mmol/L — ABNORMAL HIGH (ref 0.0–2.0)
Bicarbonate: 20.9 mmol/L (ref 20.0–28.0)
Calcium, Ion: 1.04 mmol/L — ABNORMAL LOW (ref 1.15–1.40)
HCT: 42 % (ref 39.0–52.0)
Hemoglobin: 14.3 g/dL (ref 13.0–17.0)
O2 Saturation: 76 %
Patient temperature: 98.4
Potassium: 4.6 mmol/L (ref 3.5–5.1)
Sodium: 139 mmol/L (ref 135–145)
TCO2: 22 mmol/L (ref 22–32)
pCO2, Ven: 43 mm[Hg] — ABNORMAL LOW (ref 44–60)
pH, Ven: 7.294 (ref 7.25–7.43)
pO2, Ven: 45 mm[Hg] (ref 32–45)

## 2023-09-18 LAB — BASIC METABOLIC PANEL
Anion gap: 13 (ref 5–15)
BUN: 50 mg/dL — ABNORMAL HIGH (ref 6–20)
CO2: 21 mmol/L — ABNORMAL LOW (ref 22–32)
Calcium: 8.1 mg/dL — ABNORMAL LOW (ref 8.9–10.3)
Chloride: 110 mmol/L (ref 98–111)
Creatinine, Ser: 3.38 mg/dL — ABNORMAL HIGH (ref 0.61–1.24)
GFR, Estimated: 25 mL/min — ABNORMAL LOW (ref >60)
Glucose, Bld: 310 mg/dL — ABNORMAL HIGH (ref 70–99)
Potassium: 4 mmol/L (ref 3.5–5.1)
Sodium: 144 mmol/L (ref 135–145)

## 2023-09-18 LAB — CBG MONITORING, ED
Glucose-Capillary: 178 mg/dL — ABNORMAL HIGH (ref 70–99)
Glucose-Capillary: 183 mg/dL — ABNORMAL HIGH (ref 70–99)
Glucose-Capillary: 193 mg/dL — ABNORMAL HIGH (ref 70–99)
Glucose-Capillary: 302 mg/dL — ABNORMAL HIGH (ref 70–99)
Glucose-Capillary: 386 mg/dL — ABNORMAL HIGH (ref 70–99)
Glucose-Capillary: 576 mg/dL (ref 70–99)
Glucose-Capillary: >600 mg/dL (ref 70–99)

## 2023-09-18 LAB — CBC WITH DIFFERENTIAL/PLATELET
Abs Immature Granulocytes: 0.11 10*3/uL — ABNORMAL HIGH (ref 0.00–0.07)
Basophils Absolute: 0.1 10*3/uL (ref 0.0–0.1)
Basophils Relative: 0 %
Eosinophils Absolute: 0 10*3/uL (ref 0.0–0.5)
Eosinophils Relative: 0 %
HCT: 39 % (ref 39.0–52.0)
Hemoglobin: 12.7 g/dL — ABNORMAL LOW (ref 13.0–17.0)
Immature Granulocytes: 1 %
Lymphocytes Relative: 4 %
Lymphs Abs: 0.8 10*3/uL (ref 0.7–4.0)
MCH: 25.7 pg — ABNORMAL LOW (ref 26.0–34.0)
MCHC: 32.6 g/dL (ref 30.0–36.0)
MCV: 78.9 fL — ABNORMAL LOW (ref 80.0–100.0)
Monocytes Absolute: 2 10*3/uL — ABNORMAL HIGH (ref 0.1–1.0)
Monocytes Relative: 9 %
Neutro Abs: 18.9 10*3/uL — ABNORMAL HIGH (ref 1.7–7.7)
Neutrophils Relative %: 86 %
Platelets: 248 10*3/uL (ref 150–400)
RBC: 4.94 MIL/uL (ref 4.22–5.81)
RDW: 13.6 % (ref 11.5–15.5)
WBC: 21.8 10*3/uL — ABNORMAL HIGH (ref 4.0–10.5)
nRBC: 0 % (ref 0.0–0.2)

## 2023-09-18 LAB — LIPASE, BLOOD: Lipase: 27 U/L (ref 11–51)

## 2023-09-18 LAB — RESP PANEL BY RT-PCR (RSV, FLU A&B, COVID)  RVPGX2
Influenza A by PCR: NEGATIVE
Influenza B by PCR: NEGATIVE
Resp Syncytial Virus by PCR: NEGATIVE
SARS Coronavirus 2 by RT PCR: NEGATIVE

## 2023-09-18 LAB — HEPATIC FUNCTION PANEL
ALT: 78 U/L — ABNORMAL HIGH (ref 0–44)
AST: 111 U/L — ABNORMAL HIGH (ref 15–41)
Albumin: 4.6 g/dL (ref 3.5–5.0)
Alkaline Phosphatase: 107 U/L (ref 38–126)
Bilirubin, Direct: 0.3 mg/dL — ABNORMAL HIGH (ref 0.0–0.2)
Indirect Bilirubin: 1.2 mg/dL — ABNORMAL HIGH (ref 0.3–0.9)
Total Bilirubin: 1.5 mg/dL — ABNORMAL HIGH (ref 0.3–1.2)
Total Protein: 8.3 g/dL — ABNORMAL HIGH (ref 6.5–8.1)

## 2023-09-18 LAB — RAPID URINE DRUG SCREEN, HOSP PERFORMED
Amphetamines: NOT DETECTED
Barbiturates: NOT DETECTED
Benzodiazepines: NOT DETECTED
Cocaine: NOT DETECTED
Opiates: NOT DETECTED
Tetrahydrocannabinol: POSITIVE — AB

## 2023-09-18 LAB — BETA-HYDROXYBUTYRIC ACID: Beta-Hydroxybutyric Acid: 4.16 mmol/L — ABNORMAL HIGH (ref 0.05–0.27)

## 2023-09-18 MED ORDER — SODIUM CHLORIDE 0.9 % IV BOLUS
1000.0000 mL | Freq: Once | INTRAVENOUS | Status: AC
  Administered 2023-09-18: 1000 mL via INTRAVENOUS

## 2023-09-18 MED ORDER — FENTANYL CITRATE PF 50 MCG/ML IJ SOSY
50.0000 ug | PREFILLED_SYRINGE | Freq: Once | INTRAMUSCULAR | Status: AC
  Administered 2023-09-18: 50 ug via INTRAVENOUS
  Filled 2023-09-18: qty 1

## 2023-09-18 MED ORDER — SODIUM CHLORIDE 0.9 % IV SOLN: INTRAVENOUS | Status: DC | PRN

## 2023-09-18 MED ORDER — POTASSIUM CHLORIDE 10 MEQ/100ML IV SOLN
10.0000 meq | INTRAVENOUS | Status: AC
  Administered 2023-09-18 (×2): 10 meq via INTRAVENOUS
  Filled 2023-09-18 (×2): qty 100

## 2023-09-18 MED ORDER — ONDANSETRON HCL 4 MG/2ML IJ SOLN: 4.0000 mg | Freq: Once | INTRAMUSCULAR | Status: DC

## 2023-09-18 MED ORDER — INSULIN REGULAR(HUMAN) IN NACL 100-0.9 UT/100ML-% IV SOLN
INTRAVENOUS | Status: DC
  Administered 2023-09-18: 5.5 [IU]/h via INTRAVENOUS
  Filled 2023-09-18: qty 100

## 2023-09-18 MED ORDER — DROPERIDOL 2.5 MG/ML IJ SOLN
2.5000 mg | Freq: Once | INTRAMUSCULAR | Status: AC
  Administered 2023-09-18: 2.5 mg via INTRAVENOUS
  Filled 2023-09-18: qty 2

## 2023-09-18 MED ORDER — ONDANSETRON HCL 4 MG/2ML IJ SOLN
4.0000 mg | Freq: Once | INTRAMUSCULAR | Status: AC
  Administered 2023-09-18: 4 mg via INTRAVENOUS
  Filled 2023-09-18: qty 2

## 2023-09-18 MED ORDER — FENTANYL CITRATE PF 50 MCG/ML IJ SOSY
25.0000 ug | PREFILLED_SYRINGE | Freq: Once | INTRAMUSCULAR | Status: AC
  Administered 2023-09-18: 25 ug via INTRAVENOUS
  Filled 2023-09-18: qty 1

## 2023-09-18 MED ORDER — LACTATED RINGERS IV SOLN: INTRAVENOUS | Status: DC

## 2023-09-18 MED ORDER — FENTANYL CITRATE PF 50 MCG/ML IJ SOSY: 12.5000 ug | PREFILLED_SYRINGE | Freq: Once | INTRAMUSCULAR | Status: DC

## 2023-09-18 MED ORDER — DEXTROSE 50 % IV SOLN: 0.0000 mL | INTRAVENOUS | Status: DC | PRN

## 2023-09-18 MED ORDER — DEXTROSE IN LACTATED RINGERS 5 % IV SOLN: INTRAVENOUS | Status: AC

## 2023-09-18 NOTE — ED Notes (Signed)
Report given given to Jonny Ruiz RN @[facility] 

## 2023-09-18 NOTE — Plan of Care (Signed)
[facility]  Anthony Graham (DOB [birth year])  Patient is a 27 yo M with a PMH of Diabetes Mellitus Type 1 associated with gastroparesis as well as history of cannabis use who presented to the ED with uncontrolled nausea, vomiting diarrhea as well as generalized abdominal pain that started last night.  He states that his blood sugar was reading high at home and had not been able to keep anything down and endorses dry mouth, as well as polyuria polydipsia and described his abdominal pain was a 10 out of 10.  He underwent CT scan of the abdomen pelvis which showed no real acute findings.  Blood sugars on admission were greater than 1200 so he was given IV fluid hydration and placed on insulin drip.  He is given antiemetics and is doing better.  He is excepted to [facility] stepdown unit on insulin drip for DKA.  Of note he had some Liver Fxn Abnormalities and an AKI and will need fluid resuscitation.  Of note U/A, UDS, and blood culture still pending

## 2023-09-18 NOTE — ED Notes (Signed)
Pt asked to urinate and unable

## 2023-09-18 NOTE — ED Provider Notes (Signed)
[place] [facility] AT [facility] Provider Note   CSN: [id number] Arrival date & time: 09/18/23  1135     History  Chief Complaint  Patient presents with   Abdominal Pain   Emesis    Anthony Graham is a 27 y.o. male.   Abdominal Pain Associated symptoms: diarrhea, nausea and vomiting   Emesis Associated symptoms: abdominal pain and diarrhea      27 year old male with medical history significant for type 1 diabetes, hypertension, gastroparesis, cannabis use who presents to the emergency department with uncontrolled nausea, vomiting, diarrhea and generalized abdominal pain that started last night.  He states that his blood glucose was reading high at home.  He denies any sick contacts, denies any fevers or chills.  He has been unable to keep anything down.  He endorses a dry mouth, polyuria and polydipsia. He states his abdominal pain is 10/10 and severe, no focal location.  Home Medications Prior to Admission medications   Medication Sig Start Date End Date Taking? Authorizing Provider  acetaminophen (TYLENOL) 325 MG tablet Take 2 tablets (650 mg total) by mouth every 6 (six) hours as needed. 08/02/22   Sloan Leiter, DO  amLODipine (NORVASC) 10 MG tablet Take 1 tablet (10 mg total) by mouth daily. 12/10/21   Almon Hercules, MD  carvedilol (COREG) 12.5 MG tablet Take 1 tablet (12.5 mg total) by mouth 2 (two) times daily with a meal. 04/30/22   Regalado, Prentiss Bells, MD  Elastic Bandages & Supports (MEDICAL COMPRESSION STOCKINGS) MISC Dispense 1 pair of knee-high compression stockings, to be used when you are continuously standing 03/26/21   Pollina, Canary Brim, MD  ergocalciferol (VITAMIN D2) 1.25 MG (50000 UT) capsule Take 50,000 Units by mouth once a week. Friday's    [provider]  Glucagon (BAQSIMI ONE PACK) 3 MG/DOSE POWD Place 1 spray into the nose as needed. 02/07/21   [provider]  hydrALAZINE (APRESOLINE) 25 MG tablet Take 1  tablet (25 mg total) by mouth every 8 (eight) hours. 04/30/22   Regalado, Belkys A, MD  ibuprofen (ADVIL) 600 MG tablet Take 1 tablet (600 mg total) by mouth every 6 (six) hours as needed. 08/02/22   Sloan Leiter, DO  insulin aspart (NOVOLOG) 100 UNIT/ML FlexPen Inject 90 Units into the skin continuous. Via insulin pump 01/30/17   [provider]  ketorolac (ACULAR) 0.5 % ophthalmic solution Place 1 drop into the right eye 4 (four) times daily. 08/31/21   [provider]  metoCLOPramide (REGLAN) 5 MG tablet Take 1 tablet (5 mg total) by mouth every 6 (six) hours as needed for nausea or vomiting. 04/16/23 05/16/23  Molpus, John, MD  pantoprazole (PROTONIX) 40 MG tablet Take 1 tablet (40 mg total) by mouth daily. 04/30/22   Regalado, Belkys A, MD  potassium chloride SA (KLOR-CON) 20 MEQ tablet Take 1 tablet (20 mEq total) by mouth daily. 03/02/20 08/05/20  Petrucelli, Samantha R, PA-C      Allergies    Apple juice, Banana, and Gramineae pollens    Review of Systems   Review of Systems  Gastrointestinal:  Positive for abdominal pain, diarrhea, nausea and vomiting.  Endocrine: Positive for polydipsia and polyuria.  All other systems reviewed and are negative.   Physical Exam Updated Vital Signs BP 117/70   Pulse (!) 110   Temp 98.4 F (36.9 C) (Oral)   Resp 20   Ht 5\' 6"  (1.676 m)   Wt 59 kg  SpO2 97%   BMI 20.98 kg/m  Physical Exam Vitals and nursing note reviewed.  Constitutional:      General: He is in acute distress.     Appearance: He is well-developed. He is ill-appearing.     Comments: Actively vomiting  HENT:     Head: Normocephalic and atraumatic.  Eyes:     Conjunctiva/sclera: Conjunctivae normal.  Cardiovascular:     Rate and Rhythm: Regular rhythm. Tachycardia present.  Pulmonary:     Effort: Pulmonary effort is normal. No respiratory distress.     Breath sounds: Normal breath sounds.  Abdominal:     Palpations: Abdomen is soft.     Tenderness: There  is generalized abdominal tenderness.  Musculoskeletal:        General: No swelling.     Cervical back: Neck supple.  Skin:    General: Skin is warm and dry.     Capillary Refill: Capillary refill takes less than 2 seconds.  Neurological:     Mental Status: He is alert.  Psychiatric:        Mood and Affect: Mood normal.     ED Results / Procedures / Treatments   Labs (all labs ordered are listed, but only abnormal results are displayed) Labs Reviewed  CBC WITH DIFFERENTIAL/PLATELET - Abnormal; Notable for the following components:      Result Value   WBC 21.8 (*)    Hemoglobin 12.7 (*)    MCV 78.9 (*)    MCH 25.7 (*)    Neutro Abs 18.9 (*)    Monocytes Absolute 2.0 (*)    Abs Immature Granulocytes 0.11 (*)    All other components within normal limits  BASIC METABOLIC PANEL - Abnormal; Notable for the following components:   Chloride 93 (*)    CO2 15 (*)    Glucose, Bld >1,200 (*)    BUN 46 (*)    Creatinine, Ser 3.40 (*)    GFR, Estimated 25 (*)    Anion gap 31 (*)    All other components within normal limits  HEPATIC FUNCTION PANEL - Abnormal; Notable for the following components:   Total Protein 8.3 (*)    AST 111 (*)    ALT 78 (*)    Total Bilirubin 1.5 (*)    Bilirubin, Direct 0.3 (*)    Indirect Bilirubin 1.2 (*)    All other components within normal limits  CBG MONITORING, ED - Abnormal; Notable for the following components:   Glucose-Capillary >600 (*)    All other components within normal limits  I-STAT VENOUS BLOOD GAS, ED - Abnormal; Notable for the following components:   pCO2, Ven 43.0 (*)    Acid-base deficit 6.0 (*)    Calcium, Ion 1.04 (*)    All other components within normal limits  CBG MONITORING, ED - Abnormal; Notable for the following components:   Glucose-Capillary 576 (*)    All other components within normal limits  RESP PANEL BY RT-PCR (RSV, FLU A&B, COVID)  RVPGX2  CULTURE, BLOOD (ROUTINE X 2)  CULTURE, BLOOD (ROUTINE X 2)  LIPASE,  BLOOD  RAPID URINE DRUG SCREEN, HOSP PERFORMED  URINALYSIS, W/ REFLEX TO CULTURE (INFECTION SUSPECTED)  BETA-HYDROXYBUTYRIC ACID    EKG None  Radiology CT ABDOMEN PELVIS WO CONTRAST  Result Date: 09/18/2023 CLINICAL DATA:  Vomiting, abdominal pain EXAM: CT ABDOMEN AND PELVIS WITHOUT CONTRAST TECHNIQUE: Multidetector CT imaging of the abdomen and pelvis was performed following the standard protocol without IV contrast. RADIATION DOSE REDUCTION:  This exam was performed according to the departmental dose-optimization program which includes automated exposure control, adjustment of the mA and/or kV according to patient size and/or use of iterative reconstruction technique. COMPARISON:  12/07/2021 FINDINGS: Lower chest: No acute abnormality Hepatobiliary: Diffuse low-density throughout the liver compatible with fatty infiltration. No focal abnormality. Gallbladder unremarkable. Pancreas: No focal abnormality or ductal dilatation. Spleen: No focal abnormality.  Normal size. Adrenals/Urinary Tract: Mild prominence of the proximal left ureter. The left collecting system is nondilated and the mid to distal ureter is nondilated. No visible stones. No suspicious renal or adrenal lesion. Urinary bladder unremarkable. Stomach/Bowel: Normal appendix. Stomach, large and small bowel grossly unremarkable. Vascular/Lymphatic: No evidence of aneurysm or adenopathy. Reproductive: No visible focal abnormality. Other: No free fluid or free air. Musculoskeletal: No acute bony abnormality. IMPRESSION: Hepatic steatosis. Mildly prominent proximal left ureter without hydronephrosis or visible stones. Doubt clinical significance. Normal appendix. No acute findings. Electronically Signed   By: Charlett Nose M.D.   On: 09/18/2023 16:48    Procedures .Critical Care  Performed by: Ernie Avena, MD Authorized by: Ernie Avena, MD   Critical care provider statement:    Critical care time (minutes):  65   Critical care was  necessary to treat or prevent imminent or life-threatening deterioration of the following conditions:  Endocrine crisis   Critical care was time spent personally by me on the following activities:  Development of treatment plan with patient or surrogate, discussions with consultants, evaluation of patient's response to treatment, examination of patient, ordering and review of laboratory studies, ordering and review of radiographic studies, ordering and performing treatments and interventions, pulse oximetry, re-evaluation of patient's condition and review of old charts   Care discussed with: admitting provider       Medications Ordered in ED Medications  insulin regular, human (MYXREDLIN) 100 units/ 100 mL infusion (5.5 Units/hr Intravenous New Bag/Given 09/18/23 1523)  dextrose 5 % in lactated ringers infusion (has no administration in time range)  dextrose 50 % solution 0-50 mL (has no administration in time range)  potassium chloride 10 mEq in 100 mL IVPB (10 mEq Intravenous New Bag/Given 09/18/23 1630)  lactated ringers infusion (has no administration in time range)  0.9 %  sodium chloride infusion (has no administration in time range)  sodium chloride 0.9 % bolus 1,000 mL (0 mLs Intravenous Stopped 09/18/23 1532)  droperidol (INAPSINE) 2.5 MG/ML injection 2.5 mg (2.5 mg Intravenous Given 09/18/23 1332)  fentaNYL (SUBLIMAZE) injection 25 mcg (25 mcg Intravenous Given 09/18/23 1331)  sodium chloride 0.9 % bolus 1,000 mL (0 mLs Intravenous Stopped 09/18/23 1559)  ondansetron (ZOFRAN) injection 4 mg (4 mg Intravenous Given 09/18/23 1558)  fentaNYL (SUBLIMAZE) injection 50 mcg (50 mcg Intravenous Given 09/18/23 1557)    ED Course/ Medical Decision Making/ A&P Clinical Course as of 09/18/23 1720  Tue Sep 18, 2023  1410 WBC(!): 21.8 [JL]  1410 Glucose-Capillary(!!): >600 [JL]  1410 AST(!): 111 [JL]  1410 ALT(!): 78 [JL]  1410 Total Bilirubin(!): 1.5 [JL]  1410 pH, Ven: 7.294 [JL]  1508  Creatinine(!): 3.40 [JL]  1508 Glucose(!!): >1,200 [JL]  1508 CO2(!): 15 [JL]    Clinical Course User Index [JL] Ernie Avena, MD                                 Medical Decision Making Amount and/or Complexity of Data Reviewed Labs: ordered. Decision-making details documented in ED Course. Radiology: ordered.  Risk Prescription drug management. Decision regarding hospitalization.     27 year old male with medical history significant for type 1 diabetes, hypertension, gastroparesis, cannabis use who presents to the emergency department with uncontrolled nausea, vomiting, diarrhea and generalized abdominal pain that started last night.  He states that his blood glucose was reading high at home.  He denies any sick contacts, denies any fevers or chills.  He has been unable to keep anything down.  He endorses a dry mouth, polyuria and polydipsia. He states his abdominal pain is 10/10 and severe, no focal location.  On arrival, the patient was afebrile, tachycardic heart rate 122, hypertensive BP 174/94, saturating 96% on room air.  DKA workup initiated, blood glucose on CBG greater than 600, VBG was collected and revealed an acidosis with a pH of 7.29, BMP revealed DKA with an anion gap acidosis with a bicarbonate of 15, anion gap of 31, blood glucose greater than 1200, evidence of an AKI with a serum creatinine of 3.4, leukocytosis to 21.8, mild anemia to 12.7.  Hepatic function panel revealed elevations in LFTs with an AST of the 111 and ALT of 78, mildly elevated T. bili with a direct bili of 0.3 and an indirect bili of 1.2.  COVID-19 and influenza PCR testing was collected and resulted negative and a beta hydroxybutyrate was pending, urinalysis was pending in addition to UDS.  The patient was administered 2 L of NaCl and started on an insulin drip per Endo tool protocol.  He was administered both droperidol and Zofran for his persistent nausea and vomiting, an EKG was performed revealed no  significantly prolonged QTc.  CT Abd Pel: IMPRESSION:  Hepatic steatosis.    Mildly prominent proximal left ureter without hydronephrosis or  visible stones. Doubt clinical significance.    Normal appendix.    No acute findings.    Hospitalist medicine consulted for admission for further management of the patient's diabetic ketoacidosis.  Patient feeling symptomatically improved and was hemodynamically stable on repeat assessment with a reassuring abdominal exam.  Blood glucoses were appropriately coming down on insulin drip to the 500s. Pt admitted in critical but stablizing condition.   Final Clinical Impression(s) / ED Diagnoses Final diagnoses:  Diabetic ketoacidosis without coma associated with type 1 diabetes mellitus (HCC)  Gastroparesis    Rx / DC Orders ED Discharge Orders     None         Ernie Avena, MD 09/18/23 1720

## 2023-09-18 NOTE — ED Notes (Signed)
[facility] called for transport. 

## 2023-09-18 NOTE — ED Triage Notes (Signed)
Pt to ER with c/o abdominal pain, n/v/d that started last night.  Hx of DM, states last reading was "HIGH".

## 2023-09-19 DIAGNOSIS — Z833 Family history of diabetes mellitus: Secondary | ICD-10-CM | POA: Diagnosis not present

## 2023-09-19 DIAGNOSIS — Z91018 Allergy to other foods: Secondary | ICD-10-CM | POA: Diagnosis not present

## 2023-09-19 DIAGNOSIS — E1043 Type 1 diabetes mellitus with diabetic autonomic (poly)neuropathy: Secondary | ICD-10-CM | POA: Diagnosis present

## 2023-09-19 DIAGNOSIS — Z9641 Presence of insulin pump (external) (internal): Secondary | ICD-10-CM | POA: Diagnosis present

## 2023-09-19 DIAGNOSIS — I129 Hypertensive chronic kidney disease with stage 1 through stage 4 chronic kidney disease, or unspecified chronic kidney disease: Secondary | ICD-10-CM | POA: Diagnosis present

## 2023-09-19 DIAGNOSIS — N179 Acute kidney failure, unspecified: Secondary | ICD-10-CM

## 2023-09-19 DIAGNOSIS — T465X6A Underdosing of other antihypertensive drugs, initial encounter: Secondary | ICD-10-CM | POA: Diagnosis present

## 2023-09-19 DIAGNOSIS — N1832 Chronic kidney disease, stage 3b: Secondary | ICD-10-CM | POA: Diagnosis present

## 2023-09-19 DIAGNOSIS — E1010 Type 1 diabetes mellitus with ketoacidosis without coma: Secondary | ICD-10-CM

## 2023-09-19 DIAGNOSIS — D631 Anemia in chronic kidney disease: Secondary | ICD-10-CM | POA: Diagnosis present

## 2023-09-19 DIAGNOSIS — I10 Essential (primary) hypertension: Secondary | ICD-10-CM

## 2023-09-19 DIAGNOSIS — R509 Fever, unspecified: Secondary | ICD-10-CM | POA: Diagnosis not present

## 2023-09-19 DIAGNOSIS — F1290 Cannabis use, unspecified, uncomplicated: Secondary | ICD-10-CM | POA: Diagnosis present

## 2023-09-19 DIAGNOSIS — Z1152 Encounter for screening for COVID-19: Secondary | ICD-10-CM | POA: Diagnosis not present

## 2023-09-19 DIAGNOSIS — I160 Hypertensive urgency: Secondary | ICD-10-CM | POA: Diagnosis present

## 2023-09-19 DIAGNOSIS — Z794 Long term (current) use of insulin: Secondary | ICD-10-CM | POA: Diagnosis not present

## 2023-09-19 DIAGNOSIS — Z91148 Patient's other noncompliance with medication regimen for other reason: Secondary | ICD-10-CM | POA: Diagnosis not present

## 2023-09-19 DIAGNOSIS — D72829 Elevated white blood cell count, unspecified: Secondary | ICD-10-CM | POA: Diagnosis not present

## 2023-09-19 DIAGNOSIS — E1022 Type 1 diabetes mellitus with diabetic chronic kidney disease: Secondary | ICD-10-CM | POA: Diagnosis present

## 2023-09-19 DIAGNOSIS — Z79899 Other long term (current) drug therapy: Secondary | ICD-10-CM | POA: Diagnosis not present

## 2023-09-19 DIAGNOSIS — Z91198 Patient's noncompliance with other medical treatment and regimen for other reason: Secondary | ICD-10-CM | POA: Diagnosis not present

## 2023-09-19 DIAGNOSIS — K3184 Gastroparesis: Secondary | ICD-10-CM | POA: Diagnosis present

## 2023-09-19 DIAGNOSIS — K760 Fatty (change of) liver, not elsewhere classified: Secondary | ICD-10-CM | POA: Diagnosis present

## 2023-09-19 DIAGNOSIS — Z7151 Drug abuse counseling and surveillance of drug abuser: Secondary | ICD-10-CM | POA: Diagnosis not present

## 2023-09-19 LAB — BASIC METABOLIC PANEL
Anion gap: 12 (ref 5–15)
Anion gap: 14 (ref 5–15)
Anion gap: 8 (ref 5–15)
Anion gap: 9 (ref 5–15)
BUN: 36 mg/dL — ABNORMAL HIGH (ref 6–20)
BUN: 45 mg/dL — ABNORMAL HIGH (ref 6–20)
BUN: 48 mg/dL — ABNORMAL HIGH (ref 6–20)
BUN: 49 mg/dL — ABNORMAL HIGH (ref 6–20)
CO2: 21 mmol/L — ABNORMAL LOW (ref 22–32)
CO2: 22 mmol/L (ref 22–32)
CO2: 24 mmol/L (ref 22–32)
CO2: 25 mmol/L (ref 22–32)
Calcium: 8.5 mg/dL — ABNORMAL LOW (ref 8.9–10.3)
Calcium: 8.6 mg/dL — ABNORMAL LOW (ref 8.9–10.3)
Calcium: 8.6 mg/dL — ABNORMAL LOW (ref 8.9–10.3)
Calcium: 8.7 mg/dL — ABNORMAL LOW (ref 8.9–10.3)
Chloride: 108 mmol/L (ref 98–111)
Chloride: 110 mmol/L (ref 98–111)
Chloride: 110 mmol/L (ref 98–111)
Chloride: 112 mmol/L — ABNORMAL HIGH (ref 98–111)
Creatinine, Ser: 2.86 mg/dL — ABNORMAL HIGH (ref 0.61–1.24)
Creatinine, Ser: 3.06 mg/dL — ABNORMAL HIGH (ref 0.61–1.24)
Creatinine, Ser: 3.15 mg/dL — ABNORMAL HIGH (ref 0.61–1.24)
Creatinine, Ser: 3.47 mg/dL — ABNORMAL HIGH (ref 0.61–1.24)
GFR, Estimated: 24 mL/min — ABNORMAL LOW (ref >60)
GFR, Estimated: 27 mL/min — ABNORMAL LOW (ref >60)
GFR, Estimated: 28 mL/min — ABNORMAL LOW (ref >60)
GFR, Estimated: 30 mL/min — ABNORMAL LOW (ref >60)
Glucose, Bld: 155 mg/dL — ABNORMAL HIGH (ref 70–99)
Glucose, Bld: 177 mg/dL — ABNORMAL HIGH (ref 70–99)
Glucose, Bld: 181 mg/dL — ABNORMAL HIGH (ref 70–99)
Glucose, Bld: 191 mg/dL — ABNORMAL HIGH (ref 70–99)
Potassium: 3.8 mmol/L (ref 3.5–5.1)
Potassium: 3.8 mmol/L (ref 3.5–5.1)
Potassium: 3.9 mmol/L (ref 3.5–5.1)
Potassium: 4.3 mmol/L (ref 3.5–5.1)
Sodium: 143 mmol/L (ref 135–145)
Sodium: 143 mmol/L (ref 135–145)
Sodium: 144 mmol/L (ref 135–145)
Sodium: 145 mmol/L (ref 135–145)

## 2023-09-19 LAB — GLUCOSE, CAPILLARY
Glucose-Capillary: 140 mg/dL — ABNORMAL HIGH (ref 70–99)
Glucose-Capillary: 151 mg/dL — ABNORMAL HIGH (ref 70–99)
Glucose-Capillary: 152 mg/dL — ABNORMAL HIGH (ref 70–99)
Glucose-Capillary: 154 mg/dL — ABNORMAL HIGH (ref 70–99)
Glucose-Capillary: 155 mg/dL — ABNORMAL HIGH (ref 70–99)
Glucose-Capillary: 161 mg/dL — ABNORMAL HIGH (ref 70–99)
Glucose-Capillary: 166 mg/dL — ABNORMAL HIGH (ref 70–99)
Glucose-Capillary: 167 mg/dL — ABNORMAL HIGH (ref 70–99)
Glucose-Capillary: 171 mg/dL — ABNORMAL HIGH (ref 70–99)
Glucose-Capillary: 172 mg/dL — ABNORMAL HIGH (ref 70–99)
Glucose-Capillary: 175 mg/dL — ABNORMAL HIGH (ref 70–99)
Glucose-Capillary: 177 mg/dL — ABNORMAL HIGH (ref 70–99)
Glucose-Capillary: 180 mg/dL — ABNORMAL HIGH (ref 70–99)
Glucose-Capillary: 180 mg/dL — ABNORMAL HIGH (ref 70–99)
Glucose-Capillary: 181 mg/dL — ABNORMAL HIGH (ref 70–99)
Glucose-Capillary: 184 mg/dL — ABNORMAL HIGH (ref 70–99)
Glucose-Capillary: 189 mg/dL — ABNORMAL HIGH (ref 70–99)
Glucose-Capillary: 189 mg/dL — ABNORMAL HIGH (ref 70–99)
Glucose-Capillary: 230 mg/dL — ABNORMAL HIGH (ref 70–99)
Glucose-Capillary: 254 mg/dL — ABNORMAL HIGH (ref 70–99)

## 2023-09-19 LAB — MRSA NEXT GEN BY PCR, NASAL: MRSA by PCR Next Gen: NOT DETECTED

## 2023-09-19 LAB — BETA-HYDROXYBUTYRIC ACID
Beta-Hydroxybutyric Acid: 0.65 mmol/L — ABNORMAL HIGH (ref 0.05–0.27)
Beta-Hydroxybutyric Acid: 0.76 mmol/L — ABNORMAL HIGH (ref 0.05–0.27)
Beta-Hydroxybutyric Acid: 0.22 mmol/L (ref 0.05–0.27)

## 2023-09-19 MED ORDER — MORPHINE SULFATE (PF) 2 MG/ML IV SOLN
2.0000 mg | Freq: Once | INTRAVENOUS | Status: AC
  Administered 2023-09-19: 2 mg via INTRAVENOUS
  Filled 2023-09-19: qty 1

## 2023-09-19 MED ORDER — PROCHLORPERAZINE EDISYLATE 10 MG/2ML IJ SOLN
5.0000 mg | INTRAMUSCULAR | Status: AC | PRN
  Administered 2023-09-19 (×2): 5 mg via INTRAVENOUS
  Filled 2023-09-19 (×2): qty 2

## 2023-09-19 MED ORDER — MORPHINE SULFATE (PF) 2 MG/ML IV SOLN
2.0000 mg | INTRAVENOUS | Status: DC | PRN
  Administered 2023-09-19 (×2): 2 mg via INTRAVENOUS
  Filled 2023-09-19 (×2): qty 1

## 2023-09-19 MED ORDER — PROCHLORPERAZINE EDISYLATE 10 MG/2ML IJ SOLN
10.0000 mg | Freq: Four times a day (QID) | INTRAMUSCULAR | Status: DC | PRN
  Administered 2023-09-19 – 2023-09-20 (×2): 10 mg via INTRAVENOUS
  Filled 2023-09-19 (×2): qty 2

## 2023-09-19 MED ORDER — ORAL CARE MOUTH RINSE: 15.0000 mL | OROMUCOSAL | Status: DC | PRN

## 2023-09-19 MED ORDER — INSULIN ASPART 100 UNIT/ML IJ SOLN
3.0000 [IU] | Freq: Three times a day (TID) | INTRAMUSCULAR | Status: DC
  Administered 2023-09-20 – 2023-09-21 (×2): 3 [IU] via SUBCUTANEOUS

## 2023-09-19 MED ORDER — LACTATED RINGERS IV BOLUS
250.0000 mL | Freq: Once | INTRAVENOUS | Status: AC
  Administered 2023-09-19: 250 mL via INTRAVENOUS

## 2023-09-19 MED ORDER — INSULIN ASPART 100 UNIT/ML IJ SOLN: 0.0000 [IU] | Freq: Every day | INTRAMUSCULAR | Status: DC

## 2023-09-19 MED ORDER — AMLODIPINE BESYLATE 10 MG PO TABS
10.0000 mg | ORAL_TABLET | Freq: Every day | ORAL | Status: DC
  Administered 2023-09-20 – 2023-09-21 (×2): 10 mg via ORAL
  Filled 2023-09-19 (×3): qty 1

## 2023-09-19 MED ORDER — FENTANYL CITRATE PF 50 MCG/ML IJ SOSY
12.5000 ug | PREFILLED_SYRINGE | Freq: Once | INTRAMUSCULAR | Status: AC | PRN
  Administered 2023-09-19: 12.5 ug via INTRAVENOUS
  Filled 2023-09-19: qty 1

## 2023-09-19 MED ORDER — HYDRALAZINE HCL 20 MG/ML IJ SOLN
10.0000 mg | INTRAMUSCULAR | Status: DC | PRN
  Administered 2023-09-19 – 2023-09-20 (×6): 10 mg via INTRAVENOUS
  Filled 2023-09-19 (×6): qty 1

## 2023-09-19 MED ORDER — INSULIN ASPART 100 UNIT/ML IJ SOLN
0.0000 [IU] | Freq: Three times a day (TID) | INTRAMUSCULAR | Status: DC
  Administered 2023-09-20 (×2): 3 [IU] via SUBCUTANEOUS
  Administered 2023-09-21: 5 [IU] via SUBCUTANEOUS

## 2023-09-19 MED ORDER — CHLORHEXIDINE GLUCONATE CLOTH 2 % EX PADS
6.0000 | MEDICATED_PAD | Freq: Every day | CUTANEOUS | Status: DC
  Administered 2023-09-19 – 2023-09-20 (×3): 6 via TOPICAL

## 2023-09-19 MED ORDER — ENOXAPARIN SODIUM 30 MG/0.3ML IJ SOSY
30.0000 mg | PREFILLED_SYRINGE | INTRAMUSCULAR | Status: DC
  Administered 2023-09-19 – 2023-09-21 (×3): 30 mg via SUBCUTANEOUS
  Filled 2023-09-19 (×3): qty 0.3

## 2023-09-19 MED ORDER — PNEUMOCOCCAL 20-VAL CONJ VACC 0.5 ML IM SUSY: 0.5000 mL | PREFILLED_SYRINGE | INTRAMUSCULAR | Status: DC | PRN

## 2023-09-19 MED ORDER — LACTATED RINGERS IV SOLN: INTRAVENOUS | Status: DC

## 2023-09-19 MED ORDER — ONDANSETRON HCL 4 MG/2ML IJ SOLN
4.0000 mg | Freq: Four times a day (QID) | INTRAMUSCULAR | Status: DC | PRN
  Administered 2023-09-19 – 2023-09-20 (×4): 4 mg via INTRAVENOUS
  Filled 2023-09-19 (×4): qty 2

## 2023-09-19 MED ORDER — INFLUENZA VIRUS VACC SPLIT PF (FLUZONE) 0.5 ML IM SUSY: 0.5000 mL | PREFILLED_SYRINGE | INTRAMUSCULAR | Status: DC | PRN

## 2023-09-19 MED ORDER — HYDRALAZINE HCL 25 MG PO TABS
25.0000 mg | ORAL_TABLET | Freq: Four times a day (QID) | ORAL | Status: DC
  Administered 2023-09-19 – 2023-09-20 (×4): 25 mg via ORAL
  Filled 2023-09-19 (×4): qty 1

## 2023-09-19 MED ORDER — LABETALOL HCL 5 MG/ML IV SOLN
5.0000 mg | Freq: Once | INTRAVENOUS | Status: AC | PRN
  Administered 2023-09-19: 5 mg via INTRAVENOUS
  Filled 2023-09-19: qty 4

## 2023-09-19 MED ORDER — CARVEDILOL 12.5 MG PO TABS
12.5000 mg | ORAL_TABLET | Freq: Two times a day (BID) | ORAL | Status: DC
  Administered 2023-09-19 – 2023-09-21 (×5): 12.5 mg via ORAL
  Filled 2023-09-19 (×6): qty 1

## 2023-09-19 MED ORDER — INSULIN GLARGINE-YFGN 100 UNIT/ML ~~LOC~~ SOLN
15.0000 [IU] | SUBCUTANEOUS | Status: DC
  Administered 2023-09-19 – 2023-09-20 (×2): 15 [IU] via SUBCUTANEOUS
  Filled 2023-09-19 (×4): qty 0.15

## 2023-09-19 NOTE — Progress Notes (Signed)
[facility] PROGRESS NOTE  Anthony Graham (DOB: Dec 25, 1995) MWU:[id number] PCP: Pcp, No  Brief Narrative: Anthony Graham is a 27 y.o. male with a history of T1DM on insulin pump who presented to the ED on 09/18/2023 with N/V found to have DKA with AKI.   Subjective: No HA, chest pain, palpitations, leg swelling reported. He's had low-normal subjective UOP since arrival. He's tolerating water, wants to try a diet. No abd pain anymore.   Objective: BP (!) 199/116 (BP Location: Right Arm)   Pulse (!) 116   Temp 98.8 F (37.1 C) (Oral)   Resp 15   Ht 5\' 6"  (1.676 m)   Wt 59 kg   SpO2 91%   BMI 20.98 kg/m   Gen: No distress Pulm: Clear, nonlabored  CV: Regular tachycardia GI: Soft, NT, ND, +BS Neuro: Alert and oriented. No new focal deficits. Ext: Warm, no deformities. No edema Skin: No rashes, lesions or ulcers on visualized skin   Assessment & Plan: DKA in T1DM due to nonadherence to insulin pump:  - Continue IV insulin per endotool. Will monitor serial labs to document ketone clearance, continue D5LR at 125cc/hr. Diabetes coordinator consulted.   Leukocytosis: Afebrile. No nidus of infection noted at this time, so suspecting this is reactive.  - Monitoring blood and urine cultures off abx.   AKI: UA with protein, 6-10RBCs, no WBCs, rare bacteria, no mention of casts.  - Continue IVF. Antiemetics.  N/V: Nonacute CT abd/pelvis, benign exam. Hyperbilirubinemia noted without obstruction on CT.  - Antiemetics.   HTN urgency:  - Continue home norvasc 10mg , coreg 12.5mg  BID, and restart hydralazine 25mg  tabs q6h. Continue prn IV hydralazine aiming for gradual BP reduction.   +THC on UDS:  - Cessation counseling   [name], MD [facility] [email or web address] 09/19/2023, 8:59 AM

## 2023-09-19 NOTE — Assessment & Plan Note (Signed)
-  Creatinine of 3.4 with baseline around 2-3 -On continuous IV fluids - Avoid nephrotoxic agent - Follow creatinine trend

## 2023-09-19 NOTE — Inpatient Diabetes Management (Signed)
Inpatient Diabetes Program Recommendations  AACE/ADA: New Consensus Statement on Inpatient Glycemic Control (2015)  Target Ranges:  Prepandial:   less than 140 mg/dL      Peak postprandial:   less than 180 mg/dL (1-2 hours)      Critically ill patients:  140 - 180 mg/dL   Lab Results  Component Value Date   GLUCAP 140 (H) 09/19/2023   HGBA1C 10.3 (H) 12/08/2021    Review of Glycemic Control  Diabetes history: DM1  Outpatient Diabetes medications: OmniPod  insulin pump (see settings below), Dexcom G6, if pump not on, Tresiba 26 units daily and Novolog (CHO ratio + bolus for correction and meal coverage)  Current orders for Inpatient glycemic control: IV insulin per EndoTool for DKA  Insulin pump settings:  Basal: 0.6 Blood glucose target: 150 Insulin carb ratio: 17 Insulin sensitivity factor: 60   HgbA1C - 10.3% (up from 9.3% on 05/09/23)  Inpatient Diabetes Program Recommendations:    When ready to transition to SQ insulin, give basal insulin 1-2 hours prior to discontinuation of drip.  Semglee 15 units Q24H  Novolog 0-6 units TID with meals and 0-5 HS  Novolog 3 units TID with meals if eating > 50%  Spoke with pt at bedside regarding his DKA admission, insulin pump and HgbA1C of 10.3%. Pt states he ate large quantity of carbs and did not cover for them on his pump. States Mother changes pump insertion site every 3 days, and he manages the pump. When asked about HgbA1C, pt thinks he doesn't give adequate coverage of his carbohydrates and has a lot of stress. Discussed importance of controlling blood sugars to prevent both long and short-term complications. Has gastroparesis. Discussed impact of nutrition, exercise, stress, sickness, and medications on diabetes control.  Discussed carbohydrates, carbohydrate goals, along with portion sizes. Needs to cover carbs more consistently and accurately.   Needs to make f/u appt with Endo for any questions/concerns with insulin pump  and possible need for rate adjustments for improved coverage of meals/snacks.   Continue to follow while inpatient.  Thank you. Ailene Ards, RD, LDN, CDCES Inpatient Diabetes Coordinator [phone]

## 2023-09-19 NOTE — Plan of Care (Signed)
°  Problem: Education: °Goal: Knowledge of General Education information will improve °Description: Including pain rating scale, medication(s)/side effects and non-pharmacologic comfort measures °Outcome: Progressing °  °Problem: Health Behavior/Discharge Planning: °Goal: Ability to manage health-related needs will improve °Outcome: Progressing °  °Problem: Clinical Measurements: °Goal: Ability to maintain clinical measurements within normal limits will improve °Outcome: Progressing °Goal: Will remain free from infection °Outcome: Progressing °Goal: Diagnostic test results will improve °Outcome: Progressing °  °Problem: Coping: °Goal: Level of anxiety will decrease °Outcome: Progressing °  °Problem: Safety: °Goal: Ability to remain free from injury will improve °Outcome: Progressing °  °

## 2023-09-19 NOTE — Assessment & Plan Note (Addendum)
Uncontrolled and in DKA due to non-compliance with insulin pump - replete potassium - continue insulin gtt with goal of 140-180 and AG <12 - IV NS until BG <250, then switch to D5 LR  - BMP q4hr  - keep NPO

## 2023-09-19 NOTE — ED Notes (Signed)
Insulin drip Rate changed at 2352 to 1.52ml/hr

## 2023-09-19 NOTE — H&P (Signed)
History and Physical    Patient: Anthony Graham ZDG:[id number] DOB: 12/02/1996 DOA: 09/18/2023 DOS: the patient was seen and examined on 09/19/2023 PCP: Pcp, No  Patient coming from: Home  Chief Complaint:  Chief Complaint  Patient presents with   Abdominal Pain   Emesis   HPI: Anthony Graham is a 27 y.o. male with medical history significant of uncontrolled Diabetes Mellitus Type 1 associated with gastroparesis as well as history of cannabis use who presented to the ED with  nausea, vomiting diarrhea as well as generalized abdominal pain .   Just decided not to use his insulin pump yesterday. Sugar readings at home has been high, Has not been able to keep anything down. Missed his antihypertensive medications.    On arrival to the ED, he was afebrile, tachycardic with heart rate of 130s, BP of 174/94 on room air.  BMP notable for glucose of greater than 12,000, CO2 15, creatinine of 3.4 with anion gap of 31. pH normal at 7.29, CO2 43, bicarb 20.  CBC with leukocytosis of 21K, hemoglobin of 12.7.  Negative for flu COVID and RSV.  Negative UA.  UDS positive for THC.  CT abdomen pelvis with hepatic steatosis but no acute findings.  He was initiated on IV fluids and insulin infusion and transferred here to [facility] for further management of his DKA.  Review of Systems: As mentioned in the history of present illness. All other systems reviewed and are negative. Past Medical History:  Diagnosis Date   Diabetic retinopathy (HCC)    DM (diabetes mellitus) (HCC)    Gastroparesis    HTN (hypertension)    Past Surgical History:  Procedure Laterality Date   ESOPHAGOGASTRODUODENOSCOPY     EYE SURGERY     Social History:  reports that he has never smoked. He has never been exposed to tobacco smoke. He has never used smokeless tobacco. He reports that he does not currently use alcohol. He reports current drug use. Drug: Marijuana.  Allergies  Allergen Reactions   Apple Juice  Itching   Banana Itching    And apples   Gramineae Pollens Other (See Comments)    Other reaction(s): Other (See Comments)  "Sore throat, runny nose, sneezing"    Family History  Problem Relation Age of Onset   Diabetes Mother    Diabetes Other     Prior to Admission medications   Medication Sig Start Date End Date Taking? Authorizing Provider  acetaminophen (TYLENOL) 325 MG tablet Take 2 tablets (650 mg total) by mouth every 6 (six) hours as needed. 08/02/22   Sloan Leiter, DO  amLODipine (NORVASC) 10 MG tablet Take 1 tablet (10 mg total) by mouth daily. 12/10/21   Almon Hercules, MD  carvedilol (COREG) 12.5 MG tablet Take 1 tablet (12.5 mg total) by mouth 2 (two) times daily with a meal. 04/30/22   Regalado, Prentiss Bells, MD  Elastic Bandages & Supports (MEDICAL COMPRESSION STOCKINGS) MISC Dispense 1 pair of knee-high compression stockings, to be used when you are continuously standing 03/26/21   Pollina, Canary Brim, MD  ergocalciferol (VITAMIN D2) 1.25 MG (50000 UT) capsule Take 50,000 Units by mouth once a week. Friday's    [provider]  Glucagon (BAQSIMI ONE PACK) 3 MG/DOSE POWD Place 1 spray into the nose as needed. 02/07/21   [provider]  hydrALAZINE (APRESOLINE) 25 MG tablet Take 1 tablet (25 mg total) by mouth every 8 (eight) hours. 04/30/22   Regalado, Prentiss Bells, MD  ibuprofen (ADVIL) 600 MG tablet Take 1 tablet (600 mg total) by mouth every 6 (six) hours as needed. 08/02/22   Sloan Leiter, DO  insulin aspart (NOVOLOG) 100 UNIT/ML FlexPen Inject 90 Units into the skin continuous. Via insulin pump 01/30/17   [provider]  ketorolac (ACULAR) 0.5 % ophthalmic solution Place 1 drop into the right eye 4 (four) times daily. 08/31/21   [provider]  metoCLOPramide (REGLAN) 5 MG tablet Take 1 tablet (5 mg total) by mouth every 6 (six) hours as needed for nausea or vomiting. 04/16/23 05/16/23  Molpus, John, MD  pantoprazole (PROTONIX) 40 MG tablet  Take 1 tablet (40 mg total) by mouth daily. 04/30/22   Regalado, Belkys A, MD  potassium chloride SA (KLOR-CON) 20 MEQ tablet Take 1 tablet (20 mEq total) by mouth daily. 03/02/20 08/05/20  Petrucelli, Pleas Koch, PA-C    Physical Exam: Vitals:   09/18/23 2200 09/18/23 2215 09/18/23 2300 09/18/23 2330  BP: (!) 135/91 (!) 134/93 (!) 142/98 (!) 143/105  Pulse:  (!) 107  (!) 108  Resp: 15 14 15 14   Temp:    99.1 F (37.3 C)  TempSrc:      SpO2:  100%  100%  Weight:      Height:       Constitutional: NAD, calm, mildly ill appearing young male laying in bed with active vomiting  Eyes:  lids and conjunctivae normal ENMT: Mucous membranes are moist. Neck: normal, supple Respiratory: clear to auscultation bilaterally, no wheezing, no crackles. Normal respiratory effort. No accessory muscle use.  Cardiovascular: Regular rate and rhythm, no murmurs / rubs / gallops. No extremity edema.   Abdomen: no tenderness, soft Musculoskeletal: no clubbing / cyanosis. No joint deformity upper and lower extremities. Normal muscle tone.  Skin: no rashes, lesions, ulcers. No induration Neurologic: CN 2-12 grossly intact.  Psychiatric: Normal judgment and insight. Alert and oriented x 3. Normal mood.   Data Reviewed:  See HPI  Assessment and Plan: * DKA, type 1 (HCC) Uncontrolled and in DKA due to non-compliance with insulin pump - replete potassium - continue insulin gtt with goal of 140-180 and AG <12 - IV NS until BG <250, then switch to D5 LR  - BMP q4hr  - keep NPO  HTN (hypertension) -uncontrolled. Has missed his antihypertensives. -resume amlodipine and Coreg -need to hold Lisinopril and Torsemide due to AKI -PRN IV hydralazine with perimeters  AKI (acute kidney injury) (HCC) -Creatinine of 3.4 with baseline around 2-3 -On continuous IV fluids - Avoid nephrotoxic agent - Follow creatinine trend      Advance Care Planning: Full  Consults: none  Family Communication: mother at  bedside  Severity of Illness: The appropriate patient status for this patient is INPATIENT. Inpatient status is judged to be reasonable and necessary in order to provide the required intensity of service to ensure the patient's safety. The patient's presenting symptoms, physical exam findings, and initial radiographic and laboratory data in the context of their chronic comorbidities is felt to place them at high risk for further clinical deterioration. Furthermore, it is not anticipated that the patient will be medically stable for discharge from the hospital within 2 midnights of admission.   * I certify that at the point of admission it is my clinical judgment that the patient will require inpatient hospital care spanning beyond 2 midnights from the point of admission due to high intensity of service, high risk for further deterioration and high frequency of surveillance  required.*  Author: Anselm Jungling, DO 09/19/2023 1:09 AM  For on call review [email or web address].

## 2023-09-19 NOTE — Assessment & Plan Note (Addendum)
-  uncontrolled. Has missed his antihypertensives. -resume amlodipine and Coreg -need to hold Lisinopril and Torsemide due to AKI -PRN IV hydralazine with perimeters

## 2023-09-19 NOTE — Plan of Care (Signed)
  Problem: Education: Goal: Knowledge of General Education information will improve Description: Including pain rating scale, medication(s)/side effects and non-pharmacologic comfort measures Outcome: Progressing   Problem: Health Behavior/Discharge Planning: Goal: Ability to manage health-related needs will improve Outcome: Progressing   Problem: Clinical Measurements: Goal: Ability to maintain clinical measurements within normal limits will improve Outcome: Progressing Goal: Will remain free from infection Outcome: Progressing Goal: Diagnostic test results will improve Outcome: Progressing Goal: Respiratory complications will improve Outcome: Progressing   Problem: Activity: Goal: Risk for activity intolerance will decrease Outcome: Progressing   Problem: Coping: Goal: Level of anxiety will decrease Outcome: Progressing   Problem: Nutrition: Goal: Adequate nutrition will be maintained Outcome: Not Progressing

## 2023-09-20 DIAGNOSIS — E1010 Type 1 diabetes mellitus with ketoacidosis without coma: Secondary | ICD-10-CM | POA: Diagnosis not present

## 2023-09-20 LAB — CBC
HCT: 34.7 % — ABNORMAL LOW (ref 39.0–52.0)
Hemoglobin: 10.8 g/dL — ABNORMAL LOW (ref 13.0–17.0)
MCH: 25.8 pg — ABNORMAL LOW (ref 26.0–34.0)
MCHC: 31.1 g/dL (ref 30.0–36.0)
MCV: 83 fL (ref 80.0–100.0)
Platelets: 183 10*3/uL (ref 150–400)
RBC: 4.18 MIL/uL — ABNORMAL LOW (ref 4.22–5.81)
RDW: 13.7 % (ref 11.5–15.5)
WBC: 14.7 10*3/uL — ABNORMAL HIGH (ref 4.0–10.5)
nRBC: 0 % (ref 0.0–0.2)

## 2023-09-20 LAB — BASIC METABOLIC PANEL
Anion gap: 8 (ref 5–15)
BUN: 28 mg/dL — ABNORMAL HIGH (ref 6–20)
CO2: 25 mmol/L (ref 22–32)
Calcium: 8.4 mg/dL — ABNORMAL LOW (ref 8.9–10.3)
Chloride: 105 mmol/L (ref 98–111)
Creatinine, Ser: 2.63 mg/dL — ABNORMAL HIGH (ref 0.61–1.24)
GFR, Estimated: 34 mL/min — ABNORMAL LOW (ref >60)
Glucose, Bld: 190 mg/dL — ABNORMAL HIGH (ref 70–99)
Potassium: 3.9 mmol/L (ref 3.5–5.1)
Sodium: 138 mmol/L (ref 135–145)

## 2023-09-20 LAB — GLUCOSE, CAPILLARY
Glucose-Capillary: 100 mg/dL — ABNORMAL HIGH (ref 70–99)
Glucose-Capillary: 164 mg/dL — ABNORMAL HIGH (ref 70–99)
Glucose-Capillary: 184 mg/dL — ABNORMAL HIGH (ref 70–99)
Glucose-Capillary: 222 mg/dL — ABNORMAL HIGH (ref 70–99)
Glucose-Capillary: 245 mg/dL — ABNORMAL HIGH (ref 70–99)
Glucose-Capillary: 90 mg/dL (ref 70–99)

## 2023-09-20 LAB — BETA-HYDROXYBUTYRIC ACID: Beta-Hydroxybutyric Acid: 0.58 mmol/L — ABNORMAL HIGH (ref 0.05–0.27)

## 2023-09-20 MED ORDER — HYDRALAZINE HCL 50 MG PO TABS
50.0000 mg | ORAL_TABLET | Freq: Four times a day (QID) | ORAL | Status: DC
  Administered 2023-09-20 – 2023-09-21 (×5): 50 mg via ORAL
  Filled 2023-09-20 (×5): qty 1

## 2023-09-20 NOTE — Plan of Care (Signed)
  Problem: Health Behavior/Discharge Planning: Goal: Ability to manage health-related needs will improve Outcome: Progressing   

## 2023-09-20 NOTE — Progress Notes (Signed)
[facility] PROGRESS NOTE  Anthony Graham (DOB: 05-12-1996) ZOX:[id number] PCP: Pcp, No  Brief Narrative: Anthony Graham is a 27 y.o. male with a history of T1DM on insulin pump who presented to the ED on 09/18/2023 with N/V found to have DKA with AKI.   Subjective: Feels better, but says bed is uncomfortable. He hasn't been getting up much at all and hasn't eaten since breakfast. Low grade temp this morning but doesn't feel unwell. Actually thinks he wants to go home later today.   Objective: BP (!) 197/146 (BP Location: Left Arm)   Pulse 100   Temp 99.2 F (37.3 C) (Axillary)   Resp 17   Ht 5\' 6"  (1.676 m)   Wt 59 kg   SpO2 99%   BMI 20.98 kg/m   Gen: No distress Pulm: Clear, nonlabored  CV: Regular tachycardia without MRG or edema GI: Soft, NT, ND, +BS Neuro: Alert and oriented. No new focal deficits. Ext: Warm, no deformities. Skin: No rashes, lesions or ulcers on visualized skin   Assessment & Plan: DKA in T1DM due to nonadherence to insulin pump:  - Converted IV insulin > [place] basal-bolus last night. Anion gap and acidosis remain normalized. Will continue with this until time for discharge. Push oral intake. If tolerating adequate intake and feeling well, may be candidate to DC later today. Transfer to med-surg to facilitate mobility now. Diabetes coordinator consulted.   Leukocytosis: No nidus of infection noted at this time, so suspecting this is reactive.  - Monitoring blood and urine cultures off abx. Remain NGTD. Recheck CBC this AM since having low grade fever..   AKI on stage IIIb CKD: UA with protein, 6-10RBCs, no WBCs, rare bacteria, no mention of casts.  - Cr has improved near prior baseline. Suspect progressive renal function impairment from uncontrolled hyperglycemia and recurrent AKI/DKA episodes. Will avoid nephrotoxins as able.  N/V: Nonacute CT abd/pelvis, benign exam. Hyperbilirubinemia noted without obstruction on CT.  - Continue antiemetics,  compazine more helpful than zofran. ?Cannabinoid hyperemesis. Abd exam benign.   HTN urgency:  - Continue home norvasc 10mg , coreg 12.5mg  BID, increase hydralazine 25 > 50mg  po q6h. Continue prn IV hydralazine aiming for gradual BP reduction.   +THC on UDS:  - Cessation counseling   Anthony Nine, MD [facility] [email or web address] 09/20/2023, 8:51 AM

## 2023-09-20 NOTE — Inpatient Diabetes Management (Signed)
Inpatient Diabetes Program Recommendations  AACE/ADA: New Consensus Statement on Inpatient Glycemic Control (2015)  Target Ranges:  Prepandial:   less than 140 mg/dL      Peak postprandial:   less than 180 mg/dL (1-2 hours)      Critically ill patients:  140 - 180 mg/dL   Lab Results  Component Value Date   GLUCAP 90 09/20/2023   HGBA1C 10.3 (H) 12/08/2021    Review of Glycemic Control  Diabetes history: DM1 Outpatient Diabetes medications: OmniPod pump, Dexcom G6, if pump not on, Tresiba 26 units daily and Novolog (CHO ratio + bolus for correction and meal coverage) Current orders for Inpatient glycemic control: Semglee 15 Q24H, Novolog 0-9 units TID with meals and 0-5 HS + 3 units TID  Inpatient Diabetes Program Recommendations:    Spoke with pt at bedside regarding his insulin pump. Will need to wait 24H after last Semglee dose to restart basal portion of insulin pump. Pt can cover carbs and correction immediately after pump is on.   Stressed importance of f/u with Endo, as may need pump settings to be titrated. Reviewed hypoglycemia s/s and treatment. Also reviewed HgbA1C goals.   Thank you. Ailene Ards, RD, LDN, CDCES Inpatient Diabetes Coordinator [phone]

## 2023-09-20 NOTE — Plan of Care (Signed)
  Problem: Clinical Measurements: Goal: Ability to maintain clinical measurements within normal limits will improve Outcome: Progressing Goal: Diagnostic test results will improve Outcome: Progressing   Problem: Nutrition: Goal: Adequate nutrition will be maintained Outcome: Progressing   Problem: Pain Managment: Goal: General experience of comfort will improve Outcome: Progressing   Problem: Safety: Goal: Ability to remain free from injury will improve Outcome: Progressing   Problem: Skin Integrity: Goal: Risk for impaired skin integrity will decrease Outcome: Progressing

## 2023-09-21 DIAGNOSIS — E1010 Type 1 diabetes mellitus with ketoacidosis without coma: Secondary | ICD-10-CM | POA: Diagnosis not present

## 2023-09-21 LAB — BASIC METABOLIC PANEL
Anion gap: 10 (ref 5–15)
BUN: 29 mg/dL — ABNORMAL HIGH (ref 6–20)
CO2: 23 mmol/L (ref 22–32)
Calcium: 8.3 mg/dL — ABNORMAL LOW (ref 8.9–10.3)
Chloride: 99 mmol/L (ref 98–111)
Creatinine, Ser: 2.35 mg/dL — ABNORMAL HIGH (ref 0.61–1.24)
GFR, Estimated: 38 mL/min — ABNORMAL LOW (ref >60)
Glucose, Bld: 224 mg/dL — ABNORMAL HIGH (ref 70–99)
Potassium: 4 mmol/L (ref 3.5–5.1)
Sodium: 132 mmol/L — ABNORMAL LOW (ref 135–145)

## 2023-09-21 LAB — GLUCOSE, CAPILLARY
Glucose-Capillary: 252 mg/dL — ABNORMAL HIGH (ref 70–99)
Glucose-Capillary: 84 mg/dL (ref 70–99)

## 2023-09-21 MED ORDER — ENOXAPARIN SODIUM 40 MG/0.4ML IJ SOSY: 40.0000 mg | PREFILLED_SYRINGE | INTRAMUSCULAR | Status: DC

## 2023-09-21 NOTE — Progress Notes (Signed)
   09/21/23 1219  TOC Brief Assessment  Insurance and Status Reviewed  Patient has primary care physician No (Pt to schedule appointment with provider in network with insurance)  Home environment has been reviewed Apartment  Prior level of function: Independent  Prior/Current Home Services No current home services  Social Determinants of Health Reivew SDOH reviewed no interventions necessary  Readmission risk has been reviewed Yes  Transition of care needs no transition of care needs at this time

## 2023-09-21 NOTE — Discharge Summary (Signed)
Physician Discharge Summary   Patient: Anthony Graham MRN: [id number] DOB: 1996-05-28  Admit date:     09/18/2023  Discharge date: 09/21/23  Discharge Physician: [name]   PCP: Elizabeth Sauer, FNP endocrinology  Recommendations at discharge:  Follow up with endocrinology after discharge for ongoing management of T1DM.  Follow up with PCP and/or nephrology for repeat labs to monitor CKD, recheck CMP.  Continue THC cessation counseling.   Discharge Diagnoses: Principal Problem:   DKA, type 1 (HCC) Active Problems:   AKI (acute kidney injury) (HCC)   HTN (hypertension)  Hospital Course: Anthony Graham is a 27 y.o. male with a history of T1DM on insulin pump who presented to the ED on 09/18/2023 with N/V found to have DKA with AKI.   Assessment and Plan: DKA in T1DM due to nonadherence to insulin pump:  - Converted IV insulin > [place] basal-bolus with sustained stability/improvement. Anion gap and acidosis remain normalized. Restart insulin pump and follow up with endocrinology.   Leukocytosis: No nidus of infection noted at this time, so suspecting this is reactive. WBC down without antimicrobial Tx. Did have an isolated low grade fever without localizing symptoms that has not recurred without treatment. Return precautions reviewed.   AKI on stage IIIb CKD: UA with protein, 6-10RBCs, no WBCs, rare bacteria, no mention of casts.  - Cr has improved near prior baseline. Suspect progressive renal function impairment from uncontrolled hyperglycemia and recurrent AKI/DKA episodes. Will avoid nephrotoxins as able.   N/V: Nonacute CT abd/pelvis. Hyperbilirubinemia noted without obstruction on CT.  - This has resolved. ?Cannabinoid hyperemesis. Abd exam benign. Consider reglan if recurrent.   HTN urgency:  - Continue home norvasc 10mg , coreg 12.5mg  BID, hydralazine.     +THC on UDS:  - Cessation counseling   Consultants: Diabetes coordinator Procedures performed: None  Disposition:  Home Diet recommendation: Carb modified DISCHARGE MEDICATION: Allergies as of 09/21/2023       Reactions   Apple Juice Itching   Banana Itching   Gramineae Pollens Other (See Comments)   "Sore throat, runny nose, sneezing"        Medication List     TAKE these medications    amLODipine 10 MG tablet Commonly known as: NORVASC Take 1 tablet (10 mg total) by mouth daily.   Baqsimi One Pack 3 MG/DOSE Powd Generic drug: Glucagon Place 1 spray into the nose as needed (for low BGL).   carvedilol 12.5 MG tablet Commonly known as: COREG Take 1 tablet (12.5 mg total) by mouth 2 (two) times daily with a meal.   dicyclomine 10 MG capsule Commonly known as: BENTYL Take 10 mg by mouth 4 (four) times daily as needed for spasms.   ergocalciferol 1.25 MG (50000 UT) capsule Commonly known as: VITAMIN D2 Take 50,000 Units by mouth every Wednesday.   hydrALAZINE 25 MG tablet Commonly known as: APRESOLINE Take 1 tablet (25 mg total) by mouth every 8 (eight) hours. What changed: when to take this   insulin aspart 100 UNIT/ML FlexPen Commonly known as: NOVOLOG Inject 120 Units into the skin every 3 (three) days. Via insulin pump- 120 units every three days   lisinopril 20 MG tablet Commonly known as: ZESTRIL Take 20 mg by mouth 2 (two) times daily.   Medical Compression Stockings Misc Dispense 1 pair of knee-high compression stockings, to be used when you are continuously standing   metoCLOPramide 5 MG tablet Commonly known as: Reglan Take 1 tablet (5 mg total) by mouth every 6 (  six) hours as needed for nausea or vomiting. What changed: when to take this   Omnipod 5 G6 Pods (Gen 5) Misc Inject 1 Device into the skin every 3 (three) days.   pantoprazole 40 MG tablet Commonly known as: PROTONIX Take 1 tablet (40 mg total) by mouth daily.        Follow-up Information     Maggie Font, FNP Follow up.   Specialty: Endocrinology Contact information: [address] [place] [address] [phone]                Discharge Exam: Ceasar Mons Weights   09/18/23 1210  Weight: 59 kg  BP (!) 127/97 (BP Location: Left Arm)   Pulse 84   Temp 98.1 F (36.7 C) (Oral)   Resp 18   Ht 5\' 6"  (1.676 m)   Wt 59 kg   SpO2 100%   BMI 20.98 kg/m   Glucose 84 this PM, he's eating crackers. No nausea or vomiting, saying he needs to go home. No distress, nonlabored, abd soft, NT, ND, +BS  Condition at discharge: stable  The results of significant diagnostics from this hospitalization (including imaging, microbiology, ancillary and laboratory) are listed below for reference.   Imaging Studies: CT ABDOMEN PELVIS WO CONTRAST  Result Date: 09/18/2023 CLINICAL DATA:  Vomiting, abdominal pain EXAM: CT ABDOMEN AND PELVIS WITHOUT CONTRAST TECHNIQUE: Multidetector CT imaging of the abdomen and pelvis was performed following the standard protocol without IV contrast. RADIATION DOSE REDUCTION: This exam was performed according to the departmental dose-optimization program which includes automated exposure control, adjustment of the mA and/or kV according to patient size and/or use of iterative reconstruction technique. COMPARISON:  12/07/2021 FINDINGS: Lower chest: No acute abnormality Hepatobiliary: Diffuse low-density throughout the liver compatible with fatty infiltration. No focal abnormality. Gallbladder unremarkable. Pancreas: No focal abnormality or ductal dilatation. Spleen: No focal abnormality.  Normal size. Adrenals/Urinary Tract: Mild prominence of the proximal left ureter. The left collecting system is nondilated and the mid to distal ureter is nondilated. No visible stones. No suspicious renal or adrenal lesion. Urinary bladder unremarkable. Stomach/Bowel: Normal appendix. Stomach, large and small bowel grossly unremarkable. Vascular/Lymphatic: No evidence of aneurysm or adenopathy. Reproductive: No visible focal abnormality. Other: No free fluid or free  air. Musculoskeletal: No acute bony abnormality. IMPRESSION: Hepatic steatosis. Mildly prominent proximal left ureter without hydronephrosis or visible stones. Doubt clinical significance. Normal appendix. No acute findings. Electronically Signed   By: Charlett Nose M.D.   On: 09/18/2023 16:48    Microbiology: Results for orders placed or performed during the hospital encounter of 09/18/23  Resp panel by RT-PCR (RSV, Flu A&B, Covid) Anterior Nasal Swab     Status: None   Collection Time: 09/18/23  1:35 PM   Specimen: Anterior Nasal Swab  Result Value Ref Range Status   SARS Coronavirus 2 by RT PCR NEGATIVE NEGATIVE Final    Comment: (NOTE) SARS-CoV-2 target nucleic acids are NOT DETECTED.  The SARS-CoV-2 RNA is generally detectable in upper respiratory specimens during the acute phase of infection. The lowest concentration of SARS-CoV-2 viral copies this assay can detect is 138 copies/mL. A negative result does not preclude SARS-Cov-2 infection and should not be used as the sole basis for treatment or other patient management decisions. A negative result may occur with  improper specimen collection/handling, submission of specimen other than nasopharyngeal swab, presence of viral mutation(s) within the areas targeted by this assay, and inadequate number of viral copies(<138 copies/mL). A negative  result must be combined with clinical observations, patient history, and epidemiological information. The expected result is Negative.  Fact Sheet for Patients:  BloggerCourse.com  Fact Sheet for Healthcare Providers:  SeriousBroker.it  This test is no t yet approved or cleared by the [place] FDA and  has been authorized for detection and/or diagnosis of SARS-CoV-2 by FDA under an Emergency Use Authorization (EUA). This EUA will remain  in effect (meaning this test can be used) for the duration of the COVID-19 declaration under Section  564(b)(1) of the Act, 21 U.S.C.section 360bbb-3(b)(1), unless the authorization is terminated  or revoked sooner.       Influenza A by PCR NEGATIVE NEGATIVE Final   Influenza B by PCR NEGATIVE NEGATIVE Final    Comment: (NOTE) The Xpert Xpress SARS-CoV-2/FLU/RSV plus assay is intended as an aid in the diagnosis of influenza from Nasopharyngeal swab specimens and should not be used as a sole basis for treatment. Nasal washings and aspirates are unacceptable for Xpert Xpress SARS-CoV-2/FLU/RSV testing.  Fact Sheet for Patients: BloggerCourse.com  Fact Sheet for Healthcare Providers: SeriousBroker.it  This test is not yet approved or cleared by the [place] FDA and has been authorized for detection and/or diagnosis of SARS-CoV-2 by FDA under an Emergency Use Authorization (EUA). This EUA will remain in effect (meaning this test can be used) for the duration of the COVID-19 declaration under Section 564(b)(1) of the Act, 21 U.S.C. section 360bbb-3(b)(1), unless the authorization is terminated or revoked.     Resp Syncytial Virus by PCR NEGATIVE NEGATIVE Final    Comment: (NOTE) Fact Sheet for Patients: BloggerCourse.com  Fact Sheet for Healthcare Providers: SeriousBroker.it  This test is not yet approved or cleared by the [place] FDA and has been authorized for detection and/or diagnosis of SARS-CoV-2 by FDA under an Emergency Use Authorization (EUA). This EUA will remain in effect (meaning this test can be used) for the duration of the COVID-19 declaration under Section 564(b)(1) of the Act, 21 U.S.C. section 360bbb-3(b)(1), unless the authorization is terminated or revoked.  Performed at [facility], [address] [place], [place] [address]   Blood culture (routine x 2)     Status: None (Preliminary result)   Collection Time: 09/18/23  7:48 PM    Specimen: BLOOD  Result Value Ref Range Status   Specimen Description   Final    BLOOD LEFT ANTECUBITAL Performed at [facility], [address] [place], [place] [address]    Special Requests   Final    BOTTLES DRAWN AEROBIC ONLY Blood Culture adequate volume Performed at [facility], [address] [place], [place] [address]    Culture   Final    NO GROWTH 2 DAYS Performed at [facility], [address], [place], [place] [address]    Report Status PENDING  Incomplete  Blood culture (routine x 2)     Status: None (Preliminary result)   Collection Time: 09/18/23  7:50 PM   Specimen: BLOOD  Result Value Ref Range Status   Specimen Description   Final    BLOOD RIGHT ANTECUBITAL Performed at [facility], [address] [place], [place] [address]    Special Requests   Final    BOTTLES DRAWN AEROBIC AND ANAEROBIC Blood Culture adequate volume Performed at [facility], [address] [place], [place] [address]    Culture   Final    NO  GROWTH 2 DAYS Performed at [facility], [address], [place], [place] [address]    Report Status PENDING  Incomplete  MRSA Next Gen by PCR, Nasal     Status: None   Collection Time: 09/19/23 12:27 AM   Specimen: Nasal Mucosa; Nasal Swab  Result Value Ref Range Status   MRSA by PCR Next Gen NOT DETECTED NOT DETECTED Final    Comment: (NOTE) The GeneXpert MRSA Assay (FDA approved for NASAL specimens only), is one component of a comprehensive MRSA colonization surveillance program. It is not intended to diagnose MRSA infection nor to guide or monitor treatment for MRSA infections. Test performance is not FDA approved in patients less than 27 years old. Performed at [facility], [address] [place], [place] [address]     Labs: CBC: Recent Labs  Lab 09/18/23 1335 09/18/23 1341 09/20/23 0325  WBC 21.8*  --  14.7*  NEUTROABS 18.9*  --   --   HGB 12.7*  14.3 10.8*  HCT 39.0 42.0 34.7*  MCV 78.9*  --  83.0  PLT 248  --  183   Basic Metabolic Panel: Recent Labs  Lab 09/19/23 0256 09/19/23 0849 09/19/23 1740 09/20/23 0329 09/21/23 0552  NA 143 145 143 138 132*  K 3.8 3.9 3.8 3.9 4.0  CL 110 112* 110 105 99  CO2 21* 24 25 25 23   GLUCOSE 181* 177* 155* 190* 224*  BUN 48* 45* 36* 28* 29*  CREATININE 3.15* 3.06* 2.86* 2.63* 2.35*  CALCIUM 8.6* 8.7* 8.5* 8.4* 8.3*   Liver Function Tests: Recent Labs  Lab 09/18/23 1335  AST 111*  ALT 78*  ALKPHOS 107  BILITOT 1.5*  PROT 8.3*  ALBUMIN 4.6   CBG: Recent Labs  Lab 09/20/23 1215 09/20/23 1626 09/20/23 2132 09/21/23 0730 09/21/23 1150  GLUCAP 100* 245* 164* 252* 84    Discharge time spent: greater than 30 minutes.  Signed: [name], MD [facility] 09/21/2023

## 2023-09-23 LAB — CULTURE, BLOOD (ROUTINE X 2)
Culture: NO GROWTH
Culture: NO GROWTH
Special Requests: ADEQUATE
Special Requests: ADEQUATE

## 2023-09-26 LAB — BASIC METABOLIC PANEL
Anion gap: 31 — ABNORMAL HIGH (ref 5–15)
BUN: 46 mg/dL — ABNORMAL HIGH (ref 6–20)
CO2: 15 mmol/L — ABNORMAL LOW (ref 22–32)
Calcium: 9.3 mg/dL (ref 8.9–10.3)
Chloride: 93 mmol/L — ABNORMAL LOW (ref 98–111)
Creatinine, Ser: 3.4 mg/dL — ABNORMAL HIGH (ref 0.61–1.24)
GFR, Estimated: 25 mL/min — ABNORMAL LOW (ref >60)
Glucose, Bld: >1200 mg/dL (ref 70–99)
Potassium: 4.6 mmol/L (ref 3.5–5.1)
Sodium: 139 mmol/L (ref 135–145)

## 2023-12-24 IMAGING — CT CT ABD-PELV W/O CM
2 of 4 series · 16 of 46 positions shown, 18 images · non-contrast
Comparison: None.

CLINICAL DATA: Abdominal pain.

EXAM:
CT ABDOMEN AND PELVIS WITHOUT CONTRAST
TECHNIQUE: Multidetector CT imaging of the abdomen and pelvis was performed
following the standard protocol without IV contrast.

[Series 2: axial st · axial · 0.73mm/px · z∈[-278,+77]mm · 13 of 79 slices shown, 15 images]
[im 4/79  soft-tissue]
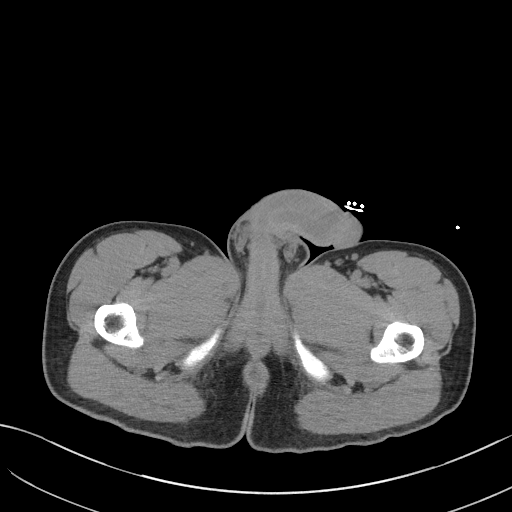
[im 4/79  bone]
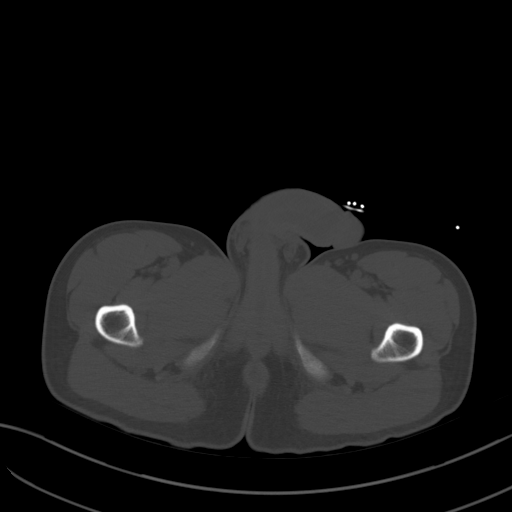
[im 10/79  soft-tissue]
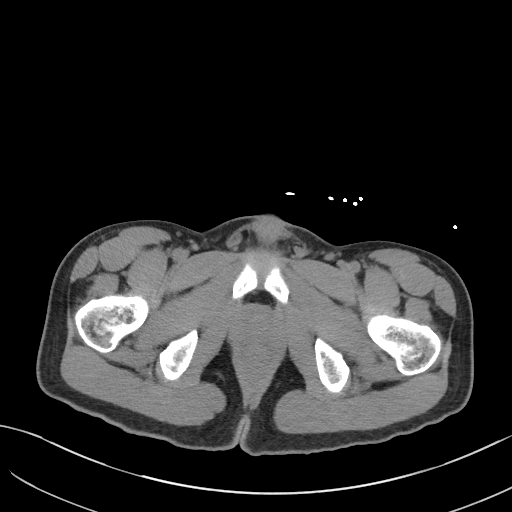
[im 17/79  soft-tissue]
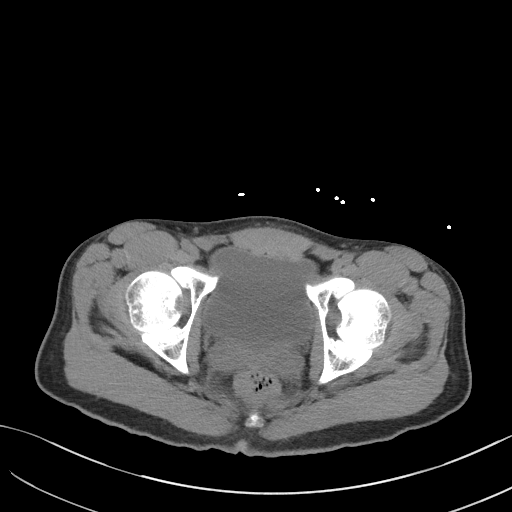
[im 23/79  soft-tissue]
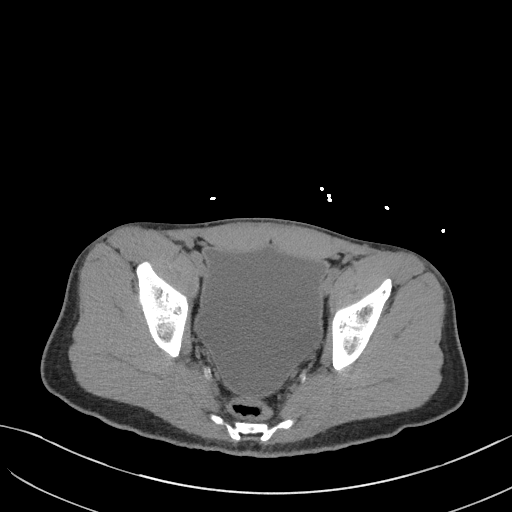
[im 27/79  soft-tissue]
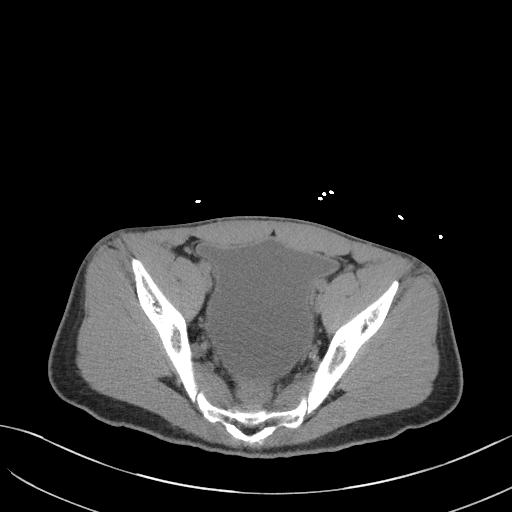
[im 33/79  soft-tissue]
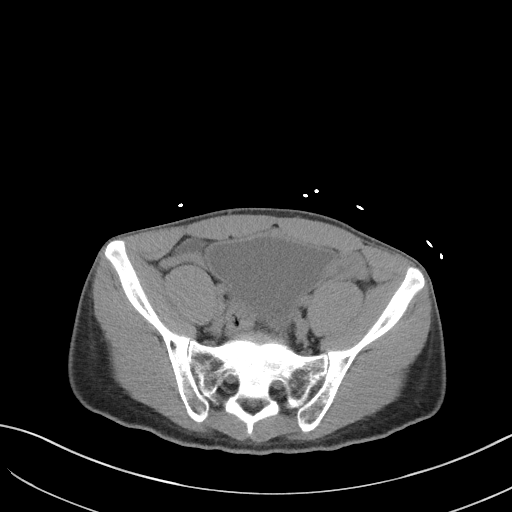
[im 40/79  soft-tissue]
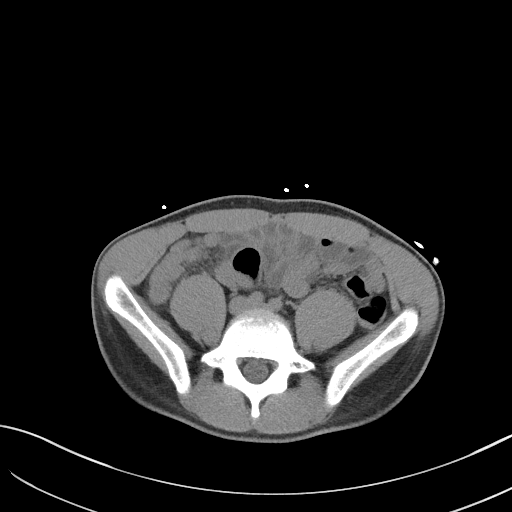
[im 46/79  soft-tissue]
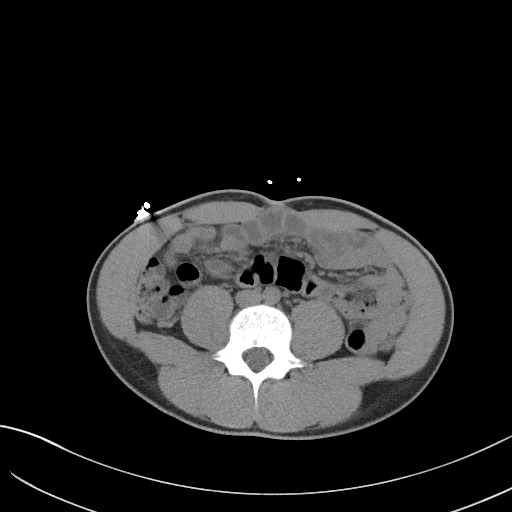
[im 53/79  soft-tissue]
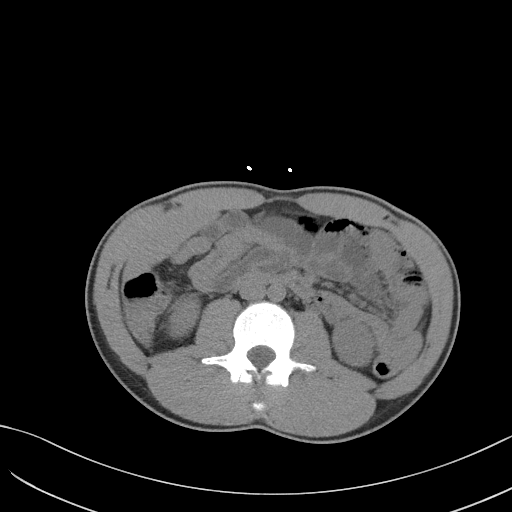
[im 53/79  bone]
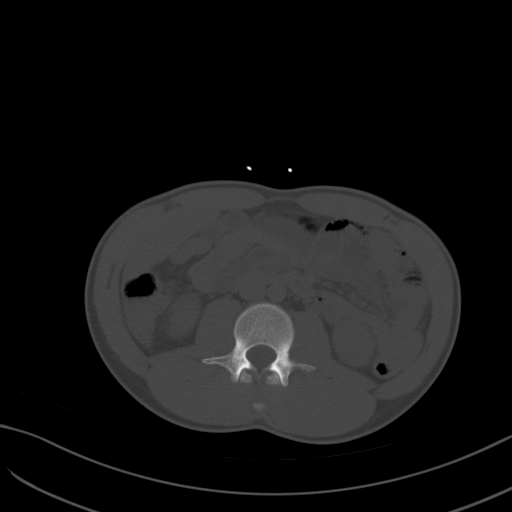
[im 56/79  soft-tissue]
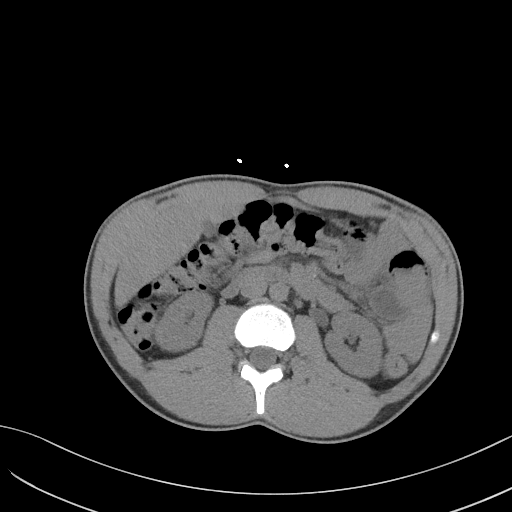
[im 62/79  soft-tissue]
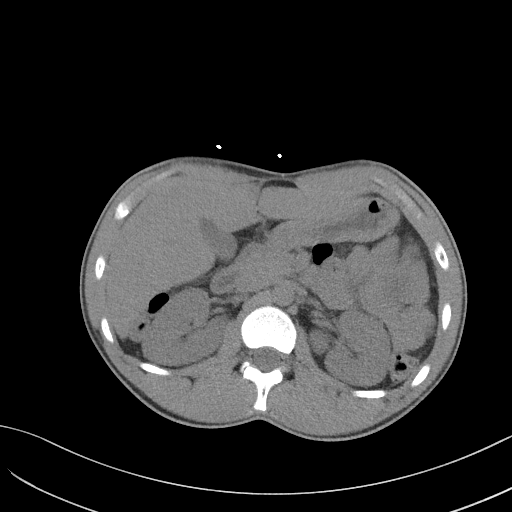
[im 69/79  soft-tissue]
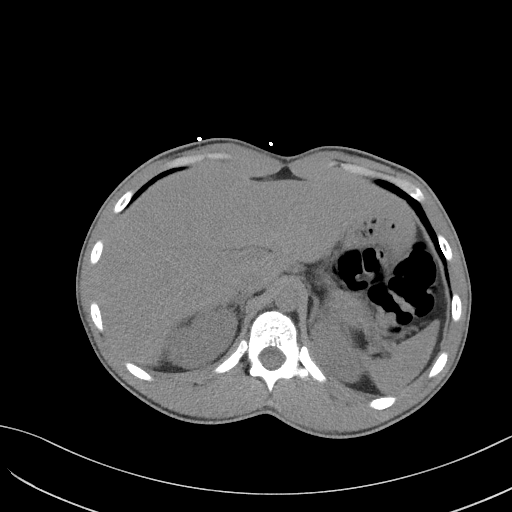
[im 75/79  soft-tissue]
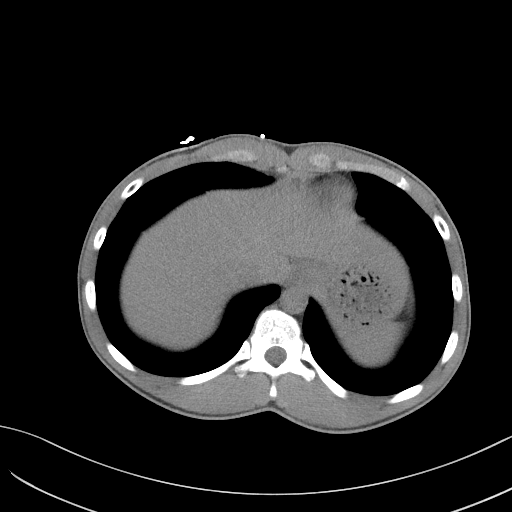

[Series 5: coronal st · coronal · 0.59mm/px · 3 of 100 slices shown]
[im 34/100  soft-tissue]
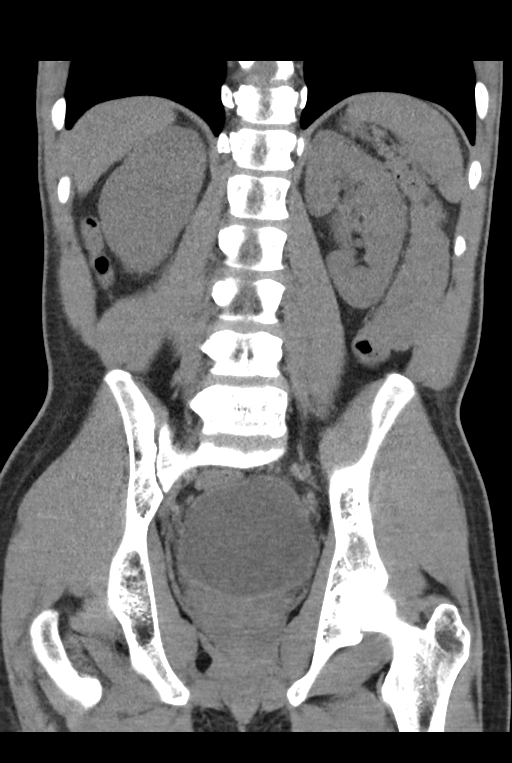
[im 45/100  soft-tissue]
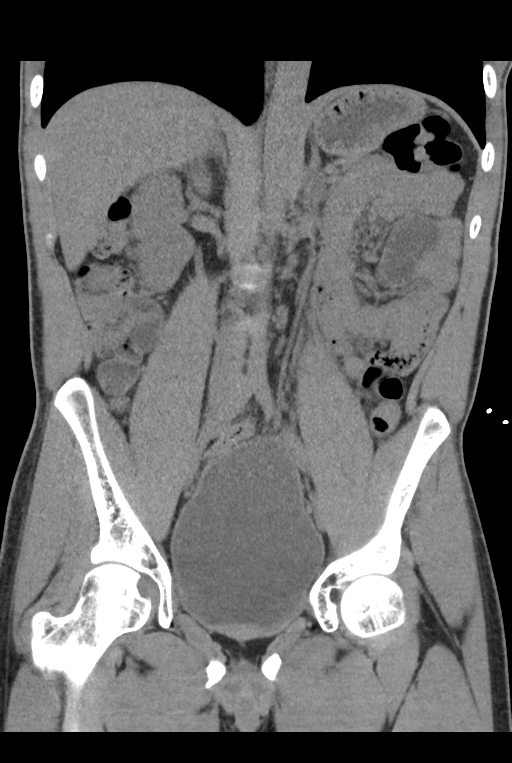
[im 56/100  soft-tissue]
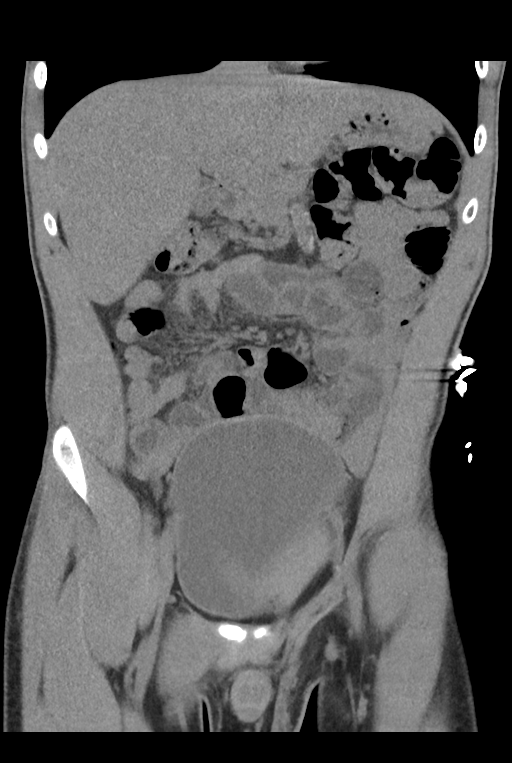

[16 of 46 positions shown; findings below may reference images not displayed]

FINDINGS: Evaluation of this exam is limited in the absence of intravenous
contrast.

Lower chest: The visualized lung bases are clear.

No intra-abdominal free air or free fluid.

Hepatobiliary: Fatty liver. No intrahepatic biliary dilatation. The
gallbladder is unremarkable.

Pancreas: The pancreas is suboptimally visualized but appears
somewhat atrophic for age.

Spleen: Normal in size without focal abnormality.

Adrenals/Urinary Tract: The adrenal glands unremarkable. The
kidneys, visualized ureters, and urinary bladder appear
unremarkable.

Stomach/Bowel: No bowel obstruction or active inflammation. The
appendix is normal.

Vascular/Lymphatic: The abdominal aorta and IVC unremarkable. No
portal venous gas. There is no adenopathy.

Reproductive: The prostate and seminal vesicles are grossly
unremarkable. No pelvic mass.

Other: None

Musculoskeletal: No acute or significant osseous findings.
IMPRESSION: 1. No acute intra-abdominal or pelvic pathology. No bowel
obstruction. Normal appendix.
2. Fatty liver.

## 2024-01-09 ENCOUNTER — Other Ambulatory Visit: Payer: Self-pay

## 2024-01-09 ENCOUNTER — Emergency Department (HOSPITAL_BASED_OUTPATIENT_CLINIC_OR_DEPARTMENT_OTHER): Payer: Medicare Other

## 2024-01-09 ENCOUNTER — Encounter (HOSPITAL_BASED_OUTPATIENT_CLINIC_OR_DEPARTMENT_OTHER): Payer: Self-pay

## 2024-01-09 ENCOUNTER — Emergency Department (HOSPITAL_BASED_OUTPATIENT_CLINIC_OR_DEPARTMENT_OTHER)
Admission: EM | Admit: 2024-01-09 | Discharge: 2024-01-10 | Disposition: A | Discharge status: Home / Self Care | Payer: Medicare Other | Attending: Emergency Medicine | Admitting: Emergency Medicine

## 2024-01-09 DIAGNOSIS — Z79899 Other long term (current) drug therapy: Secondary | ICD-10-CM | POA: Diagnosis not present

## 2024-01-09 DIAGNOSIS — N179 Acute kidney failure, unspecified: Secondary | ICD-10-CM | POA: Insufficient documentation

## 2024-01-09 DIAGNOSIS — I10 Essential (primary) hypertension: Secondary | ICD-10-CM | POA: Insufficient documentation

## 2024-01-09 DIAGNOSIS — E1010 Type 1 diabetes mellitus with ketoacidosis without coma: Secondary | ICD-10-CM | POA: Diagnosis present

## 2024-01-09 DIAGNOSIS — E1043 Type 1 diabetes mellitus with diabetic autonomic (poly)neuropathy: Secondary | ICD-10-CM | POA: Diagnosis not present

## 2024-01-09 DIAGNOSIS — K3184 Gastroparesis: Secondary | ICD-10-CM

## 2024-01-09 DIAGNOSIS — R112 Nausea with vomiting, unspecified: Secondary | ICD-10-CM | POA: Diagnosis present

## 2024-01-09 DIAGNOSIS — N184 Chronic kidney disease, stage 4 (severe): Secondary | ICD-10-CM | POA: Diagnosis present

## 2024-01-09 LAB — LIPASE, BLOOD: Lipase: 29 U/L (ref 11–51)

## 2024-01-09 LAB — COMPREHENSIVE METABOLIC PANEL
ALT: 33 U/L (ref 0–44)
AST: 37 U/L (ref 15–41)
Albumin: 4.6 g/dL (ref 3.5–5.0)
Alkaline Phosphatase: 84 U/L (ref 38–126)
Anion gap: 20 — ABNORMAL HIGH (ref 5–15)
BUN: 43 mg/dL — ABNORMAL HIGH (ref 6–20)
CO2: 19 mmol/L — ABNORMAL LOW (ref 22–32)
Calcium: 9.5 mg/dL (ref 8.9–10.3)
Chloride: 104 mmol/L (ref 98–111)
Creatinine, Ser: 3.76 mg/dL — ABNORMAL HIGH (ref 0.61–1.24)
GFR, Estimated: 22 mL/min — ABNORMAL LOW (ref >60)
Glucose, Bld: 213 mg/dL — ABNORMAL HIGH (ref 70–99)
Potassium: 4.2 mmol/L (ref 3.5–5.1)
Sodium: 143 mmol/L (ref 135–145)
Total Bilirubin: 1.3 mg/dL — ABNORMAL HIGH (ref 0.0–1.2)
Total Protein: 8.3 g/dL — ABNORMAL HIGH (ref 6.5–8.1)

## 2024-01-09 LAB — CBC
HCT: 35.9 % — ABNORMAL LOW (ref 39.0–52.0)
Hemoglobin: 11.9 g/dL — ABNORMAL LOW (ref 13.0–17.0)
MCH: 25.6 pg — ABNORMAL LOW (ref 26.0–34.0)
MCHC: 33.1 g/dL (ref 30.0–36.0)
MCV: 77.4 fL — ABNORMAL LOW (ref 80.0–100.0)
Platelets: 207 10*3/uL (ref 150–400)
RBC: 4.64 MIL/uL (ref 4.22–5.81)
RDW: 14.6 % (ref 11.5–15.5)
WBC: 13.4 10*3/uL — ABNORMAL HIGH (ref 4.0–10.5)
nRBC: 0 % (ref 0.0–0.2)

## 2024-01-09 LAB — CBG MONITORING, ED
Glucose-Capillary: 190 mg/dL — ABNORMAL HIGH (ref 70–99)
Glucose-Capillary: 210 mg/dL — ABNORMAL HIGH (ref 70–99)

## 2024-01-09 MED ORDER — DEXTROSE IN LACTATED RINGERS 5 % IV SOLN: INTRAVENOUS | Status: DC

## 2024-01-09 MED ORDER — ACETAMINOPHEN 325 MG PO TABS
650.0000 mg | ORAL_TABLET | Freq: Once | ORAL | Status: AC
Start: 2024-01-09 — End: 2024-01-09
  Administered 2024-01-09: 650 mg via ORAL
  Filled 2024-01-09: qty 2

## 2024-01-09 MED ORDER — LACTATED RINGERS IV BOLUS
20.0000 mL/kg | Freq: Once | INTRAVENOUS | Status: AC
  Administered 2024-01-10: 1180 mL via INTRAVENOUS

## 2024-01-09 MED ORDER — ONDANSETRON HCL 4 MG/2ML IJ SOLN
4.0000 mg | Freq: Once | INTRAMUSCULAR | Status: AC
  Administered 2024-01-10: 4 mg via INTRAVENOUS
  Filled 2024-01-09: qty 2

## 2024-01-09 MED ORDER — HYDRALAZINE HCL 20 MG/ML IJ SOLN
5.0000 mg | Freq: Once | INTRAMUSCULAR | Status: AC
Start: 2024-01-10 — End: 2024-01-10
  Administered 2024-01-10: 5 mg via INTRAVENOUS
  Filled 2024-01-09: qty 1

## 2024-01-09 MED ORDER — FAMOTIDINE IN NACL 20-0.9 MG/50ML-% IV SOLN
20.0000 mg | Freq: Once | INTRAVENOUS | Status: AC
  Administered 2024-01-10: 20 mg via INTRAVENOUS
  Filled 2024-01-09: qty 50

## 2024-01-09 MED ORDER — DEXTROSE 50 % IV SOLN
0.0000 mL | INTRAVENOUS | Status: DC | PRN
Start: 2024-01-09 — End: 2024-01-10

## 2024-01-09 MED ORDER — INSULIN REGULAR(HUMAN) IN NACL 100-0.9 UT/100ML-% IV SOLN
INTRAVENOUS | Status: AC
  Administered 2024-01-10: 2 [IU]/h via INTRAVENOUS
  Filled 2024-01-09: qty 100

## 2024-01-09 MED ORDER — METOCLOPRAMIDE HCL 5 MG/ML IJ SOLN
10.0000 mg | Freq: Once | INTRAMUSCULAR | Status: AC
  Administered 2024-01-10: 10 mg via INTRAVENOUS
  Filled 2024-01-09: qty 2

## 2024-01-09 MED ORDER — LACTATED RINGERS IV SOLN: INTRAVENOUS | Status: DC

## 2024-01-09 MED ORDER — FENTANYL CITRATE PF 50 MCG/ML IJ SOSY
50.0000 ug | PREFILLED_SYRINGE | Freq: Once | INTRAMUSCULAR | Status: AC
Start: 2024-01-09 — End: 2024-01-09
  Administered 2024-01-09: 50 ug via INTRAVENOUS
  Filled 2024-01-09: qty 1

## 2024-01-09 MED ORDER — POTASSIUM CHLORIDE 10 MEQ/100ML IV SOLN
10.0000 meq | INTRAVENOUS | Status: AC
  Administered 2024-01-10 (×2): 10 meq via INTRAVENOUS
  Filled 2024-01-09 (×2): qty 100

## 2024-01-09 MED ORDER — PANTOPRAZOLE SODIUM 40 MG IV SOLR
40.0000 mg | Freq: Once | INTRAVENOUS | Status: AC
  Administered 2024-01-09: 40 mg via INTRAVENOUS
  Filled 2024-01-09: qty 10

## 2024-01-09 NOTE — ED Triage Notes (Signed)
Pt reports middle abdominal pain and emesis since last night. Pt reports hx of gastroparesis and DM. Pt reports taking bentyl and zofran without relief. Pt HTN in triage with hx of same.

## 2024-01-09 NOTE — ED Provider Notes (Signed)
[place] [facility] AT [facility] Provider Note   CSN: [id number] Arrival date & time: 01/09/24  2004     History {Add pertinent medical, surgical, social history, OB history to HPI:1} Chief Complaint  Patient presents with   Abdominal Pain   Emesis    Anthony Graham is a 27 y.o. male.  Type I diabetic with insulin pump, hypertension, gastroparesis here with upper abdominal pain, nausea, vomiting and diarrhea.  Has been sick for approximately 24 hours.  Feels like a gastroparesis flare.  States sugars have been elevated in the 2 and 300 range.  On arrival he has diffuse crampy abdominal pain, is diaphoretic, nausea and vomiting.  Denies any blood in his emesis.  Has had some diarrhea as well.  Unable to tolerate any of his medications today.  Pain is diffuse in his abdomen and radiates to his epigastrium.  No chest pain or shortness of breath.  No fever.  No previous abdominal surgeries.  The history is provided by the patient.  Abdominal Pain Associated symptoms: fatigue, nausea and vomiting   Associated symptoms: no chest pain, no cough, no dysuria, no fever, no hematuria and no shortness of breath   Emesis Associated symptoms: abdominal pain   Associated symptoms: no arthralgias, no cough, no fever and no myalgias        Home Medications Prior to Admission medications   Medication Sig Start Date End Date Taking? Authorizing Provider  amLODipine (NORVASC) 10 MG tablet Take 1 tablet (10 mg total) by mouth daily. 12/10/21   Almon Hercules, MD  carvedilol (COREG) 12.5 MG tablet Take 1 tablet (12.5 mg total) by mouth 2 (two) times daily with a meal. 04/30/22   Regalado, Belkys A, MD  dicyclomine (BENTYL) 10 MG capsule Take 10 mg by mouth 4 (four) times daily as needed for spasms.    [provider]  Elastic Bandages & Supports (MEDICAL COMPRESSION STOCKINGS) MISC Dispense 1 pair of knee-high compression stockings, to be used when you are continuously  standing 03/26/21   Pollina, Canary Brim, MD  ergocalciferol (VITAMIN D2) 1.25 MG (50000 UT) capsule Take 50,000 Units by mouth every Wednesday.    [provider]  Glucagon (BAQSIMI ONE PACK) 3 MG/DOSE POWD Place 1 spray into the nose as needed (for low BGL). 02/07/21   [provider]  hydrALAZINE (APRESOLINE) 25 MG tablet Take 1 tablet (25 mg total) by mouth every 8 (eight) hours. Patient taking differently: Take 25 mg by mouth 3 (three) times daily. 04/30/22   Regalado, Belkys A, MD  insulin aspart (NOVOLOG) 100 UNIT/ML FlexPen Inject 120 Units into the skin every 3 (three) days. Via insulin pump- 120 units every three days 01/30/17   [provider]  Insulin Disposable Pump (OMNIPOD 5 G6 PODS, GEN 5,) MISC Inject 1 Device into the skin every 3 (three) days.    [provider]  lisinopril (ZESTRIL) 20 MG tablet Take 20 mg by mouth 2 (two) times daily.    [provider]  metoCLOPramide (REGLAN) 5 MG tablet Take 1 tablet (5 mg total) by mouth every 6 (six) hours as needed for nausea or vomiting. Patient taking differently: Take 5 mg by mouth 3 (three) times daily as needed for nausea or vomiting. 04/16/23 09/19/23  Molpus, John, MD  pantoprazole (PROTONIX) 40 MG tablet Take 1 tablet (40 mg total) by mouth daily. Patient not taking: Reported on 09/19/2023 04/30/22   Regalado, Jon Billings A, MD  potassium chloride SA (KLOR-CON)  20 MEQ tablet Take 1 tablet (20 mEq total) by mouth daily. 03/02/20 08/05/20  Petrucelli, Samantha R, PA-C      Allergies    Apple juice, Banana, and Gramineae pollens    Review of Systems   Review of Systems  Constitutional:  Positive for activity change, appetite change and fatigue. Negative for fever.  HENT:  Negative for congestion and rhinorrhea.   Respiratory:  Negative for cough and shortness of breath.   Cardiovascular:  Negative for chest pain.  Gastrointestinal:  Positive for abdominal pain, nausea and vomiting.   Genitourinary:  Negative for dysuria and hematuria.  Musculoskeletal:  Negative for arthralgias and myalgias.  Skin:  Negative for rash.  Neurological:  Positive for weakness.   all other systems are negative except as noted in the HPI and PMH.    Physical Exam Updated Vital Signs BP (!) 189/114   Pulse (!) 119   Temp 98.5 F (36.9 C)   Resp (!) 22   Wt 59 kg   SpO2 98%   BMI 20.98 kg/m  Physical Exam Vitals and nursing note reviewed.  Constitutional:      General: He is in acute distress.     Appearance: He is well-developed. He is ill-appearing and diaphoretic.  HENT:     Head: Normocephalic and atraumatic.     Mouth/Throat:     Pharynx: No oropharyngeal exudate.  Eyes:     Conjunctiva/sclera: Conjunctivae normal.     Pupils: Pupils are equal, round, and reactive to light.  Neck:     Comments: No meningismus. Cardiovascular:     Rate and Rhythm: Regular rhythm. Tachycardia present.     Heart sounds: Normal heart sounds. No murmur heard. Pulmonary:     Effort: Pulmonary effort is normal. No respiratory distress.     Breath sounds: Normal breath sounds.  Abdominal:     Palpations: Abdomen is soft.     Tenderness: There is abdominal tenderness. There is no guarding or rebound.     Comments: Diffuse tenderness, no guarding or rebound  Musculoskeletal:        General: No tenderness. Normal range of motion.     Cervical back: Normal range of motion and neck supple.  Skin:    General: Skin is warm.  Neurological:     Mental Status: He is alert and oriented to person, place, and time.     Cranial Nerves: No cranial nerve deficit.     Motor: No abnormal muscle tone.     Coordination: Coordination normal.     Comments: No ataxia on finger to nose bilaterally. No pronator drift. 5/5 strength throughout. CN 2-12 intact.Equal grip strength. Sensation intact.   Psychiatric:        Behavior: Behavior normal.     ED Results / Procedures / Treatments   Labs (all labs  ordered are listed, but only abnormal results are displayed) Labs Reviewed  COMPREHENSIVE METABOLIC PANEL - Abnormal; Notable for the following components:      Result Value   CO2 19 (*)    Glucose, Bld 213 (*)    BUN 43 (*)    Creatinine, Ser 3.76 (*)    Total Protein 8.3 (*)    Total Bilirubin 1.3 (*)    GFR, Estimated 22 (*)    Anion gap 20 (*)    All other components within normal limits  CBC - Abnormal; Notable for the following components:   WBC 13.4 (*)    Hemoglobin 11.9 (*)  HCT 35.9 (*)    MCV 77.4 (*)    MCH 25.6 (*)    All other components within normal limits  CBG MONITORING, ED - Abnormal; Notable for the following components:   Glucose-Capillary 210 (*)    All other components within normal limits  LIPASE, BLOOD  URINALYSIS, ROUTINE W REFLEX MICROSCOPIC  BETA-HYDROXYBUTYRIC ACID  BETA-HYDROXYBUTYRIC ACID  URINALYSIS, COMPLETE (UACMP) WITH MICROSCOPIC  CBG MONITORING, ED  I-STAT VENOUS BLOOD GAS, ED    EKG None  Radiology No results found.  Procedures .Critical Care  Performed by: Glynn Octave, MD Authorized by: Glynn Octave, MD   Critical care provider statement:    Critical care time (minutes):  60   Critical care time was exclusive of:  Separately billable procedures and treating other patients   Critical care was necessary to treat or prevent imminent or life-threatening deterioration of the following conditions:  Endocrine crisis, dehydration, metabolic crisis and renal failure   Critical care was time spent personally by me on the following activities:  Development of treatment plan with patient or surrogate, discussions with consultants, evaluation of patient's response to treatment, examination of patient, ordering and review of laboratory studies, ordering and review of radiographic studies, ordering and performing treatments and interventions, pulse oximetry, re-evaluation of patient's condition, review of old charts, blood draw for  specimens and obtaining history from patient or surrogate   I assumed direction of critical care for this patient from another provider in my specialty: no     Care discussed with: admitting provider     {Document cardiac monitor, telemetry assessment procedure when appropriate:1}  Medications Ordered in ED Medications  lactated ringers bolus 1,180 mL (has no administration in time range)  insulin regular, human (MYXREDLIN) 100 units/ 100 mL infusion (has no administration in time range)  lactated ringers infusion (has no administration in time range)  dextrose 5 % in lactated ringers infusion (has no administration in time range)  dextrose 50 % solution 0-50 mL (has no administration in time range)  potassium chloride 10 mEq in 100 mL IVPB (has no administration in time range)  ondansetron (ZOFRAN) injection 4 mg (has no administration in time range)  fentaNYL (SUBLIMAZE) injection 50 mcg (has no administration in time range)  metoCLOPramide (REGLAN) injection 10 mg (has no administration in time range)  famotidine (PEPCID) IVPB 20 mg premix (has no administration in time range)  pantoprazole (PROTONIX) injection 40 mg (has no administration in time range)  acetaminophen (TYLENOL) tablet 650 mg (650 mg Oral Given 01/09/24 2037)    ED Course/ Medical Decision Making/ A&P   {   Click here for ABCD2, HEART and other calculatorsREFRESH Note before signing :1}                              Medical Decision Making Amount and/or Complexity of Data Reviewed Labs: ordered. Decision-making details documented in ED Course. Radiology: ordered and independent interpretation performed. Decision-making details documented in ED Course. ECG/medicine tests: ordered and independent interpretation performed. Decision-making details documented in ED Course.  Risk OTC drugs. Prescription drug management.   Diabetic with gastroparesis here with upper abdominal pain, nausea and vomiting.  He is  tachycardic, hypertensive, diaphoretic.  Abdomen soft without peritoneal signs.  Will hydrate, treat symptoms.  Labs concerning for early DKA with anion gap of 23.  Will stop his insulin pump and initiate IV insulin infusion after IV fluid hydration  Hypertensive, unable  to tolerate home medications.  Will give IV hydralazine.  Acute kidney injury with creatinine 3.7 from 2.3. {Document critical care time when appropriate:1} {Document review of labs and clinical decision tools ie heart score, Chads2Vasc2 etc:1}  {Document your independent review of radiology images, and any outside records:1} {Document your discussion with family members, caretakers, and with consultants:1} {Document social determinants of health affecting pt's care:1} {Document your decision making why or why not admission, treatments were needed:1} Final Clinical Impression(s) / ED Diagnoses Final diagnoses:  None    Rx / DC Orders ED Discharge Orders     None

## 2024-01-10 ENCOUNTER — Other Ambulatory Visit (HOSPITAL_BASED_OUTPATIENT_CLINIC_OR_DEPARTMENT_OTHER): Payer: Self-pay

## 2024-01-10 DIAGNOSIS — E1010 Type 1 diabetes mellitus with ketoacidosis without coma: Secondary | ICD-10-CM | POA: Diagnosis not present

## 2024-01-10 LAB — I-STAT VENOUS BLOOD GAS, ED
Acid-Base Excess: 0 mmol/L (ref 0.0–2.0)
Acid-base deficit: 2 mmol/L (ref 0.0–2.0)
Bicarbonate: 20.3 mmol/L (ref 20.0–28.0)
Bicarbonate: 24.3 mmol/L (ref 20.0–28.0)
Calcium, Ion: 1.07 mmol/L — ABNORMAL LOW (ref 1.15–1.40)
Calcium, Ion: 1.09 mmol/L — ABNORMAL LOW (ref 1.15–1.40)
HCT: 36 % — ABNORMAL LOW (ref 39.0–52.0)
HCT: 38 % — ABNORMAL LOW (ref 39.0–52.0)
Hemoglobin: 12.2 g/dL — ABNORMAL LOW (ref 13.0–17.0)
Hemoglobin: 12.9 g/dL — ABNORMAL LOW (ref 13.0–17.0)
O2 Saturation: 99 %
O2 Saturation: 99 %
Patient temperature: 98.6
Patient temperature: 98.6
Potassium: 4 mmol/L (ref 3.5–5.1)
Potassium: 4.6 mmol/L (ref 3.5–5.1)
Sodium: 141 mmol/L (ref 135–145)
Sodium: 144 mmol/L (ref 135–145)
TCO2: 21 mmol/L — ABNORMAL LOW (ref 22–32)
TCO2: 26 mmol/L (ref 22–32)
pCO2, Ven: 22.9 mm[Hg] — ABNORMAL LOW (ref 44–60)
pCO2, Ven: 47.7 mm[Hg] (ref 44–60)
pH, Ven: 7.314 (ref 7.25–7.43)
pH, Ven: 7.555 — ABNORMAL HIGH (ref 7.25–7.43)
pO2, Ven: 128 mm[Hg] — ABNORMAL HIGH (ref 32–45)
pO2, Ven: 97 mm[Hg] — ABNORMAL HIGH (ref 32–45)

## 2024-01-10 LAB — URINALYSIS, ROUTINE W REFLEX MICROSCOPIC
Bilirubin Urine: NEGATIVE
Glucose, UA: >=500 mg/dL — AB
Ketones, ur: NEGATIVE mg/dL
Leukocytes,Ua: NEGATIVE
Nitrite: NEGATIVE
Protein, ur: >=300 mg/dL — AB
Specific Gravity, Urine: 1.02 (ref 1.005–1.030)
pH: 6 (ref 5.0–8.0)

## 2024-01-10 LAB — BASIC METABOLIC PANEL
Anion gap: 14 (ref 5–15)
Anion gap: 13 (ref 5–15)
BUN: 49 mg/dL — ABNORMAL HIGH (ref 6–20)
BUN: 51 mg/dL — ABNORMAL HIGH (ref 6–20)
CO2: 22 mmol/L (ref 22–32)
CO2: 21 mmol/L — ABNORMAL LOW (ref 22–32)
Calcium: 8.9 mg/dL (ref 8.9–10.3)
Calcium: 8.7 mg/dL — ABNORMAL LOW (ref 8.9–10.3)
Chloride: 107 mmol/L (ref 98–111)
Chloride: 107 mmol/L (ref 98–111)
Creatinine, Ser: 4.06 mg/dL — ABNORMAL HIGH (ref 0.61–1.24)
Creatinine, Ser: 4.11 mg/dL — ABNORMAL HIGH (ref 0.61–1.24)
GFR, Estimated: 20 mL/min — ABNORMAL LOW (ref >60)
GFR, Estimated: 20 mL/min — ABNORMAL LOW (ref >60)
Glucose, Bld: 137 mg/dL — ABNORMAL HIGH (ref 70–99)
Glucose, Bld: 201 mg/dL — ABNORMAL HIGH (ref 70–99)
Potassium: 3.7 mmol/L (ref 3.5–5.1)
Potassium: 4.1 mmol/L (ref 3.5–5.1)
Sodium: 143 mmol/L (ref 135–145)
Sodium: 141 mmol/L (ref 135–145)

## 2024-01-10 LAB — LACTIC ACID, PLASMA
Lactic Acid, Venous: 1.9 mmol/L (ref 0.5–1.9)
Lactic Acid, Venous: 1.3 mmol/L (ref 0.5–1.9)

## 2024-01-10 LAB — CBG MONITORING, ED
Glucose-Capillary: 107 mg/dL — ABNORMAL HIGH (ref 70–99)
Glucose-Capillary: 137 mg/dL — ABNORMAL HIGH (ref 70–99)
Glucose-Capillary: 219 mg/dL — ABNORMAL HIGH (ref 70–99)
Glucose-Capillary: 191 mg/dL — ABNORMAL HIGH (ref 70–99)
Glucose-Capillary: 192 mg/dL — ABNORMAL HIGH (ref 70–99)

## 2024-01-10 LAB — URINALYSIS, MICROSCOPIC (REFLEX)

## 2024-01-10 LAB — HEMOGLOBIN A1C
Hgb A1c MFr Bld: 9.9 % — ABNORMAL HIGH (ref 4.8–5.6)
Mean Plasma Glucose: 237.43 mg/dL

## 2024-01-10 LAB — BETA-HYDROXYBUTYRIC ACID
Beta-Hydroxybutyric Acid: 2.5 mmol/L — ABNORMAL HIGH (ref 0.05–0.27)
Beta-Hydroxybutyric Acid: 1.03 mmol/L — ABNORMAL HIGH (ref 0.05–0.27)

## 2024-01-10 MED ORDER — SODIUM CHLORIDE 0.9 % IV BOLUS
1000.0000 mL | Freq: Once | INTRAVENOUS | Status: AC
  Administered 2024-01-10: 1000 mL via INTRAVENOUS

## 2024-01-10 MED ORDER — INSULIN ASPART 100 UNIT/ML IJ SOLN: 0.0000 [IU] | Freq: Three times a day (TID) | INTRAMUSCULAR | Status: DC

## 2024-01-10 MED ORDER — ONDANSETRON HCL 4 MG/2ML IJ SOLN: 4.0000 mg | Freq: Four times a day (QID) | INTRAMUSCULAR | Status: DC | PRN

## 2024-01-10 MED ORDER — INSULIN ASPART 100 UNIT/ML IJ SOLN: 0.0000 [IU] | Freq: Every day | INTRAMUSCULAR | Status: DC

## 2024-01-10 MED ORDER — LACTATED RINGERS IV SOLN: INTRAVENOUS | Status: DC

## 2024-01-10 MED ORDER — INSULIN GLARGINE-YFGN 100 UNIT/ML ~~LOC~~ SOLN
15.0000 [IU] | Freq: Every day | SUBCUTANEOUS | Status: DC
  Administered 2024-01-10: 15 [IU] via SUBCUTANEOUS
  Filled 2024-01-10: qty 150

## 2024-01-10 MED ORDER — HYDROMORPHONE HCL 1 MG/ML IJ SOLN
1.0000 mg | Freq: Once | INTRAMUSCULAR | Status: AC
  Administered 2024-01-10: 1 mg via INTRAVENOUS
  Filled 2024-01-10: qty 1

## 2024-01-10 MED ORDER — INSULIN ASPART 100 UNIT/ML IJ SOLN
2.0000 [IU] | Freq: Once | INTRAMUSCULAR | Status: AC
  Administered 2024-01-10: 2 [IU] via SUBCUTANEOUS

## 2024-01-10 MED ORDER — ONDANSETRON 4 MG PO TBDP
4.0000 mg | ORAL_TABLET | Freq: Three times a day (TID) | ORAL | 0 refills | Status: DC | PRN
  Filled 2024-01-10: qty 20, 7d supply, fill #0

## 2024-01-10 MED ORDER — DEXTROSE 5 % IV SOLN
INTRAVENOUS | Status: DC
Start: 2024-01-10 — End: 2024-01-10

## 2024-01-10 MED ORDER — DEXTROSE 5 % IV SOLN: Freq: Once | INTRAVENOUS | Status: AC

## 2024-01-10 MED ORDER — INSULIN ASPART 100 UNIT/ML IJ SOLN: 2.0000 [IU] | Freq: Three times a day (TID) | INTRAMUSCULAR | Status: DC

## 2024-01-10 NOTE — Discharge Instructions (Signed)
Your history, exam, workup today was consistent with dehydration with kidney injury due to the diabetic ketoacidosis or DKA.  As your symptoms have improved and your sugars have improved we feel you are safe for discharge home as you are able to orally hydrate and eat and drink well.  Please use the nausea medicine and continue managing her sugars at home.  Please follow-up with your primary doctor.  If any symptoms change or worsen acutely, please return to the nearest emergency department.  Please rest and stay hydrated.

## 2024-01-10 NOTE — ED Notes (Signed)
Called [facility] to advise Patient can now go to [facility] instead of Step[facility].  They will have Hospitalist contact ED Physician

## 2024-01-10 NOTE — ED Notes (Signed)

## 2024-01-10 NOTE — Care Plan (Addendum)
Plan of Care Note for accepted transfer  Patient: Anthony Graham              ZOX:[id number]  DOA: 01/09/2024     Facility requesting transfer: [facility] emergency department Requesting Provider: Glynn Octave, MD  Reason for transfer: DKA with DM type I, nausea, vomiting, hypertensive emergency and AKI.  Facility course: 28 year old man history of DM type I, gastroparesis, CKD stage IV, GERD and essential hypertension presented to emergency department with complaining of abdominal pain and vomiting.   At presentation to ED patient found to have elevated blood pressure 189/114, tachycardic 119.  POC glucose 210. Normal lipase 29. CMP showed low bicarb 19, elevated blood glucose 210, elevated anion gap 20, elevated BUN 43, elevated creatinine 3.76, elevated bilirubin 1.3 and low GFR 22. Pending beta-hydroxybutyrate level. Pending UA. PBG showed pH 7.3, low pCO2 22, elevated pO2 97 and bicarb 20.  X-ray abdomen no acute findings.  In the ED patient has been given 1.1 L of LR bolus and started on insulin drip per Endo tool DKA protocol.  Dr. Manus Gunning reported that in the ED patient has been given hydralazine 5 mg with that systolic blood pressure has been improved to 160s range.  Patient also having nausea and vomiting and having poor oral tolerance.  Hospitalist has been consulted for further management of DKA in the setting of DM type I, hypertensive emergency, AKI on CKD stage IV, nausea and vomiting.  Addendum at 2:43 AM - Dr. Manus Gunning informed that BMP showing anion gap has been closed 20-14 and bicarb level is 22.  Pharmacy has been consulted started long-acting subcu insulin 15 unit bridging with insulin drip for 2 hours, NovoLog 2 unit 3 times daily, sliding scale SSI and at bedtime insulin as needed coverage.  Given patient blood glucose is borderline low continuing D50 or LR as patient has been started on long-acting insulin.  Once insulin drip will be completely  discontinue in that case need to DC the D50. Ultimately plan to transition to insulin pump once patient's family can bring the supplies from home.     Plan of care: The patient is accepted for admission for inpatient status to [facility] unit, at [facility].  Check [email or web address] for on-call coverage.  [facility] will assume care on arrival to accepting facility. Until arrival, medical decision making responsibilities remain with the EDP.  However, [facility] available 24/7 for questions and assistance.   Nursing staff please page [facility] Admits and Consults [phone]) as soon as the patient arrives to the hospital.    Author: Tereasa Coop, MD  01/10/2024  [facility]

## 2024-01-10 NOTE — ED Provider Notes (Signed)
10:31 AM Anthony Graham is a 28 y.o. male who was going to be admitted for AKI and DKA however as he has been here for many hours overnight and this morning, his symptoms have improved and his labs have improved and his glucose has improved.  He initially was going to be in the stepdown unit followed by the floor but I spoke with the medicine team who requested we reassess him and see how he is feeling.  If he is feeling better, they felt he may be safe for discharge and outpatient follow-up.  I just spoke to the patient and he is feeling much better.  He has no symptoms now and would like to go home rather be admitted.  We will give him prescription for nausea medicine and he will stay on his home medications and follow-up with his primary doctor.  He understands extremely strict return precautions and follow-up instructions and will be discharged for outpatient follow-up.   Clinical Impression: 1. Diabetic ketoacidosis without coma associated with type 1 diabetes mellitus (HCC)   2. AKI (acute kidney injury) (HCC)   3. Gastroparesis     Disposition: Discharge  Condition: Good  I have discussed the results, Dx and Tx plan with the pt(& family if present). He/she/they expressed understanding and agree(s) with the plan. Discharge instructions discussed at great length. Strict return precautions discussed and pt &/or family have verbalized understanding of the instructions. No further questions at time of discharge.    New Prescriptions   ONDANSETRON (ZOFRAN-ODT) 4 MG DISINTEGRATING TABLET    Take 1 tablet (4 mg total) by mouth every 8 (eight) hours as needed for nausea or vomiting.    Follow Up: [facility] [address] [place] [address] [place] [address] [phone] Schedule an appointment as soon as possible for a visit    [facility] Emergency Department at [facility] [address] [place]  [address] [phone]       Kamilah Correia, Canary Brim, MD 01/10/24 1037

## 2024-01-10 NOTE — ED Notes (Signed)
Pt. Given snack with novolog

## 2024-01-11 ENCOUNTER — Encounter (HOSPITAL_BASED_OUTPATIENT_CLINIC_OR_DEPARTMENT_OTHER): Payer: Self-pay | Admitting: Emergency Medicine

## 2024-01-11 ENCOUNTER — Emergency Department (HOSPITAL_BASED_OUTPATIENT_CLINIC_OR_DEPARTMENT_OTHER)
Admission: EM | Admit: 2024-01-11 | Discharge: 2024-01-11 | Disposition: A | Discharge status: Home / Self Care | Payer: Medicare Other | Attending: Emergency Medicine | Admitting: Emergency Medicine

## 2024-01-11 ENCOUNTER — Other Ambulatory Visit: Payer: Self-pay

## 2024-01-11 DIAGNOSIS — E119 Type 2 diabetes mellitus without complications: Secondary | ICD-10-CM | POA: Insufficient documentation

## 2024-01-11 DIAGNOSIS — I10 Essential (primary) hypertension: Secondary | ICD-10-CM | POA: Diagnosis not present

## 2024-01-11 DIAGNOSIS — R1084 Generalized abdominal pain: Secondary | ICD-10-CM | POA: Insufficient documentation

## 2024-01-11 LAB — COMPREHENSIVE METABOLIC PANEL
ALT: 31 U/L (ref 0–44)
AST: 58 U/L — ABNORMAL HIGH (ref 15–41)
Albumin: 3.9 g/dL (ref 3.5–5.0)
Alkaline Phosphatase: 74 U/L (ref 38–126)
Anion gap: 9 (ref 5–15)
BUN: 42 mg/dL — ABNORMAL HIGH (ref 6–20)
CO2: 23 mmol/L (ref 22–32)
Calcium: 8.7 mg/dL — ABNORMAL LOW (ref 8.9–10.3)
Chloride: 108 mmol/L (ref 98–111)
Creatinine, Ser: 3.42 mg/dL — ABNORMAL HIGH (ref 0.61–1.24)
GFR, Estimated: 24 mL/min — ABNORMAL LOW (ref >60)
Glucose, Bld: 168 mg/dL — ABNORMAL HIGH (ref 70–99)
Potassium: 4.1 mmol/L (ref 3.5–5.1)
Sodium: 140 mmol/L (ref 135–145)
Total Bilirubin: 1.2 mg/dL (ref 0.0–1.2)
Total Protein: 7.1 g/dL (ref 6.5–8.1)

## 2024-01-11 LAB — CBC
HCT: 33.4 % — ABNORMAL LOW (ref 39.0–52.0)
Hemoglobin: 10.6 g/dL — ABNORMAL LOW (ref 13.0–17.0)
MCH: 24.7 pg — ABNORMAL LOW (ref 26.0–34.0)
MCHC: 31.7 g/dL (ref 30.0–36.0)
MCV: 77.7 fL — ABNORMAL LOW (ref 80.0–100.0)
Platelets: 162 10*3/uL (ref 150–400)
RBC: 4.3 MIL/uL (ref 4.22–5.81)
RDW: 14.7 % (ref 11.5–15.5)
WBC: 13.2 10*3/uL — ABNORMAL HIGH (ref 4.0–10.5)
nRBC: 0 % (ref 0.0–0.2)

## 2024-01-11 LAB — CBG MONITORING, ED: Glucose-Capillary: 130 mg/dL — ABNORMAL HIGH (ref 70–99)

## 2024-01-11 LAB — LIPASE, BLOOD: Lipase: 34 U/L (ref 11–51)

## 2024-01-11 MED ORDER — HALOPERIDOL LACTATE 5 MG/ML IJ SOLN
2.0000 mg | Freq: Once | INTRAMUSCULAR | Status: AC
  Administered 2024-01-11: 2 mg via INTRAVENOUS
  Filled 2024-01-11: qty 1

## 2024-01-11 MED ORDER — SODIUM CHLORIDE 0.9 % IV BOLUS
1000.0000 mL | Freq: Once | INTRAVENOUS | Status: AC
  Administered 2024-01-11: 1000 mL via INTRAVENOUS

## 2024-01-11 NOTE — ED Triage Notes (Signed)
Abdominal pain started right after discharge yesterday. Tried RX with no relief.

## 2024-01-11 NOTE — ED Provider Notes (Signed)
[facility] DEPT [facility] Emergency Department Provider Note MRN:  [id number]  Arrival date & time: 01/11/24     Chief Complaint   Abdominal Pain   History of Present Illness   Anthony Graham is a 28 y.o. year-old male with a history of diabetes, gastroparesis presenting to the ED with chief complaint of abdominal pain.  Generalized abdominal pain starting today, recently discharged from hospital.  Gets frequent abdominal pain flares related to gastroparesis, feels similar.  Denies fever, no nausea vomiting or diarrhea.  Review of Systems  A thorough review of systems was obtained and all systems are negative except as noted in the HPI and PMH.   Patient's Health History    Past Medical History:  Diagnosis Date   Diabetic retinopathy (HCC)    DM (diabetes mellitus) (HCC)    Gastroparesis    HTN (hypertension)     Past Surgical History:  Procedure Laterality Date   ESOPHAGOGASTRODUODENOSCOPY     EYE SURGERY      Family History  Problem Relation Age of Onset   Diabetes Mother    Diabetes Other     Social History   Socioeconomic History   Marital status: Single    Spouse name: Not on file   Number of children: Not on file   Years of education: Not on file   Highest education level: Not on file  Occupational History   Not on file  Tobacco Use   Smoking status: Never    Passive exposure: Never   Smokeless tobacco: Never  Vaping Use   Vaping status: Every Day   Substances: Nicotine  Substance and Sexual Activity   Alcohol use: Not Currently    Comment: occ   Drug use: Yes    Types: Marijuana    Comment: daily since 2017   Sexual activity: Not Currently  Other Topics Concern   Not on file  Social History Narrative   Not on file   Social Drivers of Health   Financial Resource Strain: Not on file  Food Insecurity: No Food Insecurity (09/19/2023)   Hunger Vital Sign    Worried About Running Out of Food in the Last Year: Never true    Ran  Out of Food in the Last Year: Never true  Transportation Needs: No Transportation Needs (09/19/2023)   PRAPARE - Administrator, Civil Service (Medical): No    Lack of Transportation (Non-Medical): No  Physical Activity: Not on file  Stress: Not on file  Social Connections: Not on file  Intimate Partner Violence: Not At Risk (09/19/2023)   Humiliation, Afraid, Rape, and Kick questionnaire    Fear of Current or Ex-Partner: No    Emotionally Abused: No    Physically Abused: No    Sexually Abused: No     Physical Exam   Vitals:   01/11/24 0430 01/11/24 0530  BP: (!) 197/117 (!) 148/96  Pulse: (!) 124 (!) 102  Resp:    Temp:    SpO2: 100% 99%    CONSTITUTIONAL: Well-appearing, moderate distress due to pain NEURO/PSYCH:  Alert and oriented x 3, no focal deficits EYES:  eyes equal and reactive ENT/NECK:  no LAD, no JVD CARDIO: Tachycardic rate, well-perfused, normal S1 and S2 PULM:  CTAB no wheezing or rhonchi GI/GU:  non-distended, non-tender MSK/SPINE:  No gross deformities, no edema SKIN:  no rash, atraumatic   *Additional and/or pertinent findings included in MDM below  Diagnostic and Interventional Summary    EKG Interpretation  Date/Time:    Ventricular Rate:    PR Interval:    QRS Duration:    QT Interval:    QTC Calculation:   R Axis:      Text Interpretation:         Labs Reviewed  CBC - Abnormal; Notable for the following components:      Result Value   WBC 13.2 (*)    Hemoglobin 10.6 (*)    HCT 33.4 (*)    MCV 77.7 (*)    MCH 24.7 (*)    All other components within normal limits  COMPREHENSIVE METABOLIC PANEL - Abnormal; Notable for the following components:   Glucose, Bld 168 (*)    BUN 42 (*)    Creatinine, Ser 3.42 (*)    Calcium 8.7 (*)    AST 58 (*)    GFR, Estimated 24 (*)    All other components within normal limits  CBG MONITORING, ED - Abnormal; Notable for the following components:   Glucose-Capillary 130 (*)    All  other components within normal limits  LIPASE, BLOOD    No orders to display    Medications  haloperidol lactate (HALDOL) injection 2 mg (2 mg Intravenous Given 01/11/24 0421)  sodium chloride 0.9 % bolus 1,000 mL (0 mLs Intravenous Stopped 01/11/24 0539)     Procedures  /  Critical Care Procedures  ED Course and Medical Decision Making  Initial Impression and Ddx Suspect gastroparesis, also considering pancreatitis, DKA.  Past medical/surgical history that increases complexity of ED encounter: Diabetes, gastroparesis  Interpretation of Diagnostics I personally reviewed the laboratory assessment and my interpretation is as follows: No significant blood count or electrolyte disturbance    Patient Reassessment and Ultimate Disposition/Management     Patient feeling much better on reassessment, sleeping comfortably, wakes easily, soft abdomen, vital signs reassuring, appropriate for discharge.  Patient management required discussion with the following services or consulting groups:  None  Complexity of Problems Addressed Acute illness or injury that poses threat of life of bodily function  Additional Data Reviewed and Analyzed Further history obtained from: Further history from spouse/family member  Additional Factors Impacting ED Encounter Risk None  Anthony Graham. Anthony Plate, MD [facility] Emergency Medicine [facility] [email or web address]  Final Clinical Impressions(s) / ED Diagnoses     ICD-10-CM   1. Generalized abdominal pain  R10.84       ED Discharge Orders     None        Discharge Instructions Discussed with and Provided to Patient:     Discharge Instructions      You were evaluated in the Emergency Department and after careful evaluation, we did not find any emergent condition requiring admission or further testing in the hospital.  Your exam/testing today is overall reassuring.  Recommend continuing your home medications and following  up with your primary care doctor.  Please return to the Emergency Department if you experience any worsening of your condition.   Thank you for allowing [place] to be a part of your care.       Sabas Sous, MD 01/11/24 [phone]

## 2024-01-11 NOTE — Discharge Instructions (Addendum)
You were evaluated in the Emergency Department and after careful evaluation, we did not find any emergent condition requiring admission or further testing in the hospital.  Your exam/testing today is overall reassuring.  Recommend continuing your home medications and following up with your primary care doctor.  Please return to the Emergency Department if you experience any worsening of your condition.   Thank you for allowing [place] to be a part of your care.

## 2024-03-26 ENCOUNTER — Encounter (HOSPITAL_BASED_OUTPATIENT_CLINIC_OR_DEPARTMENT_OTHER): Payer: Self-pay | Admitting: Emergency Medicine

## 2024-03-26 ENCOUNTER — Inpatient Hospital Stay (HOSPITAL_BASED_OUTPATIENT_CLINIC_OR_DEPARTMENT_OTHER)
Admission: EM | Admit: 2024-03-26 | Discharge: 2024-03-28 | DRG: 074 | Disposition: A | Discharge status: Home / Self Care | Attending: Family Medicine | Admitting: Family Medicine

## 2024-03-26 ENCOUNTER — Other Ambulatory Visit: Payer: Self-pay

## 2024-03-26 DIAGNOSIS — F17290 Nicotine dependence, other tobacco product, uncomplicated: Secondary | ICD-10-CM | POA: Diagnosis present

## 2024-03-26 DIAGNOSIS — K3184 Gastroparesis: Secondary | ICD-10-CM | POA: Diagnosis present

## 2024-03-26 DIAGNOSIS — Z9641 Presence of insulin pump (external) (internal): Secondary | ICD-10-CM | POA: Diagnosis present

## 2024-03-26 DIAGNOSIS — Z9889 Other specified postprocedural states: Secondary | ICD-10-CM | POA: Diagnosis not present

## 2024-03-26 DIAGNOSIS — R112 Nausea with vomiting, unspecified: Principal | ICD-10-CM

## 2024-03-26 DIAGNOSIS — Z91048 Other nonmedicinal substance allergy status: Secondary | ICD-10-CM | POA: Diagnosis not present

## 2024-03-26 DIAGNOSIS — E872 Acidosis, unspecified: Secondary | ICD-10-CM | POA: Diagnosis present

## 2024-03-26 DIAGNOSIS — I10 Essential (primary) hypertension: Secondary | ICD-10-CM | POA: Diagnosis not present

## 2024-03-26 DIAGNOSIS — Z79899 Other long term (current) drug therapy: Secondary | ICD-10-CM

## 2024-03-26 DIAGNOSIS — I160 Hypertensive urgency: Secondary | ICD-10-CM | POA: Diagnosis present

## 2024-03-26 DIAGNOSIS — R6510 Systemic inflammatory response syndrome (SIRS) of non-infectious origin without acute organ dysfunction: Secondary | ICD-10-CM | POA: Diagnosis present

## 2024-03-26 DIAGNOSIS — E103512 Type 1 diabetes mellitus with proliferative diabetic retinopathy with macular edema, left eye: Secondary | ICD-10-CM | POA: Diagnosis present

## 2024-03-26 DIAGNOSIS — I129 Hypertensive chronic kidney disease with stage 1 through stage 4 chronic kidney disease, or unspecified chronic kidney disease: Secondary | ICD-10-CM | POA: Diagnosis present

## 2024-03-26 DIAGNOSIS — Z794 Long term (current) use of insulin: Secondary | ICD-10-CM

## 2024-03-26 DIAGNOSIS — E1022 Type 1 diabetes mellitus with diabetic chronic kidney disease: Secondary | ICD-10-CM | POA: Diagnosis present

## 2024-03-26 DIAGNOSIS — K449 Diaphragmatic hernia without obstruction or gangrene: Secondary | ICD-10-CM | POA: Diagnosis present

## 2024-03-26 DIAGNOSIS — N179 Acute kidney failure, unspecified: Secondary | ICD-10-CM | POA: Diagnosis present

## 2024-03-26 DIAGNOSIS — E860 Dehydration: Secondary | ICD-10-CM | POA: Diagnosis present

## 2024-03-26 DIAGNOSIS — E1043 Type 1 diabetes mellitus with diabetic autonomic (poly)neuropathy: Secondary | ICD-10-CM | POA: Diagnosis present

## 2024-03-26 DIAGNOSIS — N1832 Chronic kidney disease, stage 3b: Secondary | ICD-10-CM | POA: Diagnosis present

## 2024-03-26 DIAGNOSIS — Z833 Family history of diabetes mellitus: Secondary | ICD-10-CM | POA: Diagnosis not present

## 2024-03-26 DIAGNOSIS — Z91018 Allergy to other foods: Secondary | ICD-10-CM | POA: Diagnosis not present

## 2024-03-26 DIAGNOSIS — R008 Other abnormalities of heart beat: Secondary | ICD-10-CM | POA: Diagnosis not present

## 2024-03-26 LAB — I-STAT VENOUS BLOOD GAS, ED
Acid-Base Excess: 6 mmol/L — ABNORMAL HIGH (ref 0.0–2.0)
Bicarbonate: 28.3 mmol/L — ABNORMAL HIGH (ref 20.0–28.0)
Calcium, Ion: 1.04 mmol/L — ABNORMAL LOW (ref 1.15–1.40)
HCT: 38 % — ABNORMAL LOW (ref 39.0–52.0)
Hemoglobin: 12.9 g/dL — ABNORMAL LOW (ref 13.0–17.0)
O2 Saturation: 91 %
Patient temperature: 98.7
Potassium: 4.6 mmol/L (ref 3.5–5.1)
Sodium: 144 mmol/L (ref 135–145)
TCO2: 29 mmol/L (ref 22–32)
pCO2, Ven: 32.6 mmHg — ABNORMAL LOW (ref 44–60)
pH, Ven: 7.546 — ABNORMAL HIGH (ref 7.25–7.43)
pO2, Ven: 52 mmHg — ABNORMAL HIGH (ref 32–45)

## 2024-03-26 LAB — COMPREHENSIVE METABOLIC PANEL WITH GFR
ALT: 24 U/L (ref 0–44)
AST: 38 U/L (ref 15–41)
Albumin: 4.4 g/dL (ref 3.5–5.0)
Alkaline Phosphatase: 108 U/L (ref 38–126)
Anion gap: 20 — ABNORMAL HIGH (ref 5–15)
BUN: 34 mg/dL — ABNORMAL HIGH (ref 6–20)
CO2: 23 mmol/L (ref 22–32)
Calcium: 9.9 mg/dL (ref 8.9–10.3)
Chloride: 102 mmol/L (ref 98–111)
Creatinine, Ser: 3.53 mg/dL — ABNORMAL HIGH (ref 0.61–1.24)
GFR, Estimated: 23 mL/min — ABNORMAL LOW (ref >60)
Glucose, Bld: 175 mg/dL — ABNORMAL HIGH (ref 70–99)
Potassium: 4.1 mmol/L (ref 3.5–5.1)
Sodium: 145 mmol/L (ref 135–145)
Total Bilirubin: 1.1 mg/dL (ref 0.0–1.2)
Total Protein: 8.8 g/dL — ABNORMAL HIGH (ref 6.5–8.1)

## 2024-03-26 LAB — BASIC METABOLIC PANEL WITH GFR
Anion gap: 13 (ref 5–15)
BUN: 35 mg/dL — ABNORMAL HIGH (ref 6–20)
CO2: 25 mmol/L (ref 22–32)
Calcium: 9 mg/dL (ref 8.9–10.3)
Chloride: 103 mmol/L (ref 98–111)
Creatinine, Ser: 3.34 mg/dL — ABNORMAL HIGH (ref 0.61–1.24)
GFR, Estimated: 25 mL/min — ABNORMAL LOW (ref >60)
Glucose, Bld: 177 mg/dL — ABNORMAL HIGH (ref 70–99)
Potassium: 3.7 mmol/L (ref 3.5–5.1)
Sodium: 141 mmol/L (ref 135–145)

## 2024-03-26 LAB — CBC
HCT: 37.9 % — ABNORMAL LOW (ref 39.0–52.0)
Hemoglobin: 12.5 g/dL — ABNORMAL LOW (ref 13.0–17.0)
MCH: 25.2 pg — ABNORMAL LOW (ref 26.0–34.0)
MCHC: 33 g/dL (ref 30.0–36.0)
MCV: 76.3 fL — ABNORMAL LOW (ref 80.0–100.0)
Platelets: 264 10*3/uL (ref 150–400)
RBC: 4.97 MIL/uL (ref 4.22–5.81)
RDW: 13.9 % (ref 11.5–15.5)
WBC: 12.3 10*3/uL — ABNORMAL HIGH (ref 4.0–10.5)
nRBC: 0 % (ref 0.0–0.2)

## 2024-03-26 LAB — LACTIC ACID, PLASMA
Lactic Acid, Venous: 2.9 mmol/L (ref 0.5–1.9)
Lactic Acid, Venous: 1.9 mmol/L (ref 0.5–1.9)

## 2024-03-26 LAB — URINALYSIS, ROUTINE W REFLEX MICROSCOPIC
Bilirubin Urine: NEGATIVE
Glucose, UA: >=500 mg/dL — AB
Ketones, ur: NEGATIVE mg/dL
Leukocytes,Ua: NEGATIVE
Nitrite: NEGATIVE
Protein, ur: >=300 mg/dL — AB
Specific Gravity, Urine: 1.025 (ref 1.005–1.030)
pH: 5.5 (ref 5.0–8.0)

## 2024-03-26 LAB — URINALYSIS, MICROSCOPIC (REFLEX)

## 2024-03-26 LAB — BETA-HYDROXYBUTYRIC ACID: Beta-Hydroxybutyric Acid: 1.55 mmol/L — ABNORMAL HIGH (ref 0.05–0.27)

## 2024-03-26 LAB — GLUCOSE, CAPILLARY
Glucose-Capillary: 184 mg/dL — ABNORMAL HIGH (ref 70–99)
Glucose-Capillary: 241 mg/dL — ABNORMAL HIGH (ref 70–99)

## 2024-03-26 LAB — LIPASE, BLOOD: Lipase: 27 U/L (ref 11–51)

## 2024-03-26 LAB — CBG MONITORING, ED: Glucose-Capillary: 167 mg/dL — ABNORMAL HIGH (ref 70–99)

## 2024-03-26 MED ORDER — CARVEDILOL 12.5 MG PO TABS
12.5000 mg | ORAL_TABLET | Freq: Two times a day (BID) | ORAL | Status: DC
  Administered 2024-03-26 – 2024-03-27 (×2): 12.5 mg via ORAL
  Filled 2024-03-26 (×2): qty 1

## 2024-03-26 MED ORDER — ACETAMINOPHEN 650 MG RE SUPP: 650.0000 mg | Freq: Four times a day (QID) | RECTAL | Status: DC | PRN

## 2024-03-26 MED ORDER — DROPERIDOL 2.5 MG/ML IJ SOLN
1.2500 mg | Freq: Once | INTRAMUSCULAR | Status: AC
  Administered 2024-03-26: 1.25 mg via INTRAVENOUS
  Filled 2024-03-26: qty 2

## 2024-03-26 MED ORDER — ESCITALOPRAM OXALATE 10 MG PO TABS: 5.0000 mg | ORAL_TABLET | ORAL | Status: DC | PRN

## 2024-03-26 MED ORDER — OXYCODONE HCL 5 MG PO TABS
5.0000 mg | ORAL_TABLET | ORAL | Status: DC | PRN
  Administered 2024-03-27: 5 mg via ORAL
  Filled 2024-03-26: qty 1

## 2024-03-26 MED ORDER — LACTATED RINGERS IV BOLUS
1000.0000 mL | Freq: Once | INTRAVENOUS | Status: AC
  Administered 2024-03-26: 1000 mL via INTRAVENOUS

## 2024-03-26 MED ORDER — SENNOSIDES-DOCUSATE SODIUM 8.6-50 MG PO TABS: 1.0000 | ORAL_TABLET | Freq: Every evening | ORAL | Status: DC | PRN

## 2024-03-26 MED ORDER — LACTATED RINGERS IV SOLN: INTRAVENOUS | Status: DC

## 2024-03-26 MED ORDER — INSULIN PUMP
Freq: Three times a day (TID) | SUBCUTANEOUS | Status: DC
  Administered 2024-03-27 (×3): 1 via SUBCUTANEOUS
  Filled 2024-03-26: qty 1

## 2024-03-26 MED ORDER — NICARDIPINE HCL IN NACL 20-0.86 MG/200ML-% IV SOLN
3.0000 mg/h | INTRAVENOUS | Status: DC
  Administered 2024-03-26: 2.5 mg/h via INTRAVENOUS
  Administered 2024-03-26: 5 mg/h via INTRAVENOUS
  Filled 2024-03-26 (×2): qty 200

## 2024-03-26 MED ORDER — CHLORHEXIDINE GLUCONATE CLOTH 2 % EX PADS
6.0000 | MEDICATED_PAD | Freq: Every day | CUTANEOUS | Status: DC
  Administered 2024-03-26 – 2024-03-27 (×2): 6 via TOPICAL

## 2024-03-26 MED ORDER — ESCITALOPRAM OXALATE 10 MG PO TABS
5.0000 mg | ORAL_TABLET | Freq: Every day | ORAL | Status: DC
  Administered 2024-03-26 – 2024-03-27 (×2): 5 mg via ORAL
  Filled 2024-03-26 (×3): qty 0.5

## 2024-03-26 MED ORDER — METOCLOPRAMIDE HCL 5 MG PO TABS: 5.0000 mg | ORAL_TABLET | Freq: Four times a day (QID) | ORAL | Status: DC | PRN

## 2024-03-26 MED ORDER — HYDRALAZINE HCL 20 MG/ML IJ SOLN
10.0000 mg | Freq: Once | INTRAMUSCULAR | Status: AC
  Administered 2024-03-26: 10 mg via INTRAVENOUS
  Filled 2024-03-26: qty 1

## 2024-03-26 MED ORDER — SODIUM CHLORIDE 0.9 % IV SOLN
2.0000 g | Freq: Once | INTRAVENOUS | Status: AC
  Administered 2024-03-26: 2 g via INTRAVENOUS
  Filled 2024-03-26: qty 12.5

## 2024-03-26 MED ORDER — VANCOMYCIN HCL IN DEXTROSE 1-5 GM/200ML-% IV SOLN: 1000.0000 mg | INTRAVENOUS | Status: DC

## 2024-03-26 MED ORDER — LABETALOL HCL 5 MG/ML IV SOLN: 10.0000 mg | Freq: Once | INTRAVENOUS | Status: DC

## 2024-03-26 MED ORDER — FENTANYL CITRATE PF 50 MCG/ML IJ SOSY
50.0000 ug | PREFILLED_SYRINGE | Freq: Once | INTRAMUSCULAR | Status: AC
  Administered 2024-03-26: 50 ug via INTRAVENOUS
  Filled 2024-03-26: qty 1

## 2024-03-26 MED ORDER — HYDRALAZINE HCL 25 MG PO TABS
25.0000 mg | ORAL_TABLET | Freq: Three times a day (TID) | ORAL | Status: DC
  Administered 2024-03-26 – 2024-03-27 (×2): 25 mg via ORAL
  Filled 2024-03-26 (×2): qty 1

## 2024-03-26 MED ORDER — ONDANSETRON HCL 4 MG/2ML IJ SOLN
4.0000 mg | Freq: Four times a day (QID) | INTRAMUSCULAR | Status: DC | PRN
  Administered 2024-03-26: 4 mg via INTRAVENOUS
  Filled 2024-03-26: qty 2

## 2024-03-26 MED ORDER — FENTANYL CITRATE PF 50 MCG/ML IJ SOSY
50.0000 ug | PREFILLED_SYRINGE | INTRAMUSCULAR | Status: DC | PRN
  Administered 2024-03-26 – 2024-03-27 (×2): 50 ug via INTRAVENOUS
  Filled 2024-03-26 (×2): qty 1

## 2024-03-26 MED ORDER — VANCOMYCIN HCL IN DEXTROSE 1-5 GM/200ML-% IV SOLN
1000.0000 mg | Freq: Once | INTRAVENOUS | Status: AC
  Administered 2024-03-26: 1000 mg via INTRAVENOUS
  Filled 2024-03-26: qty 200

## 2024-03-26 MED ORDER — METOCLOPRAMIDE HCL 5 MG/ML IJ SOLN
10.0000 mg | Freq: Once | INTRAMUSCULAR | Status: AC
  Administered 2024-03-26: 10 mg via INTRAVENOUS
  Filled 2024-03-26: qty 2

## 2024-03-26 MED ORDER — AMLODIPINE BESYLATE 10 MG PO TABS
10.0000 mg | ORAL_TABLET | Freq: Every day | ORAL | Status: DC
  Administered 2024-03-26 – 2024-03-28 (×3): 10 mg via ORAL
  Filled 2024-03-26 (×3): qty 1

## 2024-03-26 MED ORDER — ACETAMINOPHEN 325 MG PO TABS
650.0000 mg | ORAL_TABLET | Freq: Four times a day (QID) | ORAL | Status: DC | PRN
  Administered 2024-03-27: 650 mg via ORAL
  Filled 2024-03-26: qty 2

## 2024-03-26 MED ORDER — LORAZEPAM 2 MG/ML IJ SOLN: 0.5000 mg | INTRAMUSCULAR | Status: DC | PRN

## 2024-03-26 MED ORDER — CARVEDILOL 12.5 MG PO TABS
12.5000 mg | ORAL_TABLET | ORAL | Status: DC | PRN
Start: 2024-03-26 — End: 2024-03-26

## 2024-03-26 MED ORDER — ENOXAPARIN SODIUM 30 MG/0.3ML IJ SOSY
30.0000 mg | PREFILLED_SYRINGE | INTRAMUSCULAR | Status: DC
  Administered 2024-03-26 – 2024-03-27 (×2): 30 mg via SUBCUTANEOUS
  Filled 2024-03-26 (×2): qty 0.3

## 2024-03-26 MED ORDER — SODIUM CHLORIDE 0.9 % IV SOLN: 2.0000 g | INTRAVENOUS | Status: DC

## 2024-03-26 NOTE — ED Triage Notes (Signed)
 ABD  pain and emesis started  yesterday , Hx type 1 diabetes , and gastroparesis .  CBG 180 at home he reports

## 2024-03-26 NOTE — Progress Notes (Signed)
 Pharmacy Antibiotic Note  Anthony Graham is a 28 y.o. male admitted on 03/26/2024 with sepsis.  Pharmacy has been consulted for vancomycin dosing.  Today, 03/26/24 WBC slightly elevated SCr 3.34 appears to be ~baseline  Plan: Cefepime 2 g IV q24h Vancomycin 1000 mg IV q48h for estimated AUC of 489 Goal AUC 400-550. Check levels as needed Monitor renal function, culture data  Weight: 59 kg (130 lb)  Temp (24hrs), Avg:99 F (37.2 C), Min:98.7 F (37.1 C), Max:99.6 F (37.6 C)  Recent Labs  Lab 03/26/24 0941 03/26/24 1236 03/26/24 1641  WBC 12.3*  --   --   CREATININE 3.53*  --  3.34*  LATICACIDVEN  --  2.9* 1.9    Estimated Creatinine Clearance: 23.7 mL/min (A) (by C-G formula based on SCr of 3.34 mg/dL (H)).    Allergies  Allergen Reactions   Apple Juice Itching   Banana Itching   Gramineae Pollens Other (See Comments)    "Sore throat, runny nose, sneezing"    Shireen Dory, PharmD 03/26/2024 5:34 PM

## 2024-03-26 NOTE — ED Provider Notes (Signed)
 [place] EMERGENCY DEPARTMENT AT [facility] Provider Note  CSN: [id number] Arrival date & time: 03/26/24 4098  Chief Complaint(s) Emesis and Abdominal Pain  HPI Anthony Graham is a 28 y.o. male with a history of diabetes, gastroparesis.  He comes in today for abdominal pain and vomiting.  Patient has an insulin pump and glucometer.  Insulin pump and glucometer are functioning properly.  Patient denies any fever or chills.  States this feels like his previous episodes of gastroparesis.   Past Medical History Past Medical History:  Diagnosis Date   Diabetic retinopathy (HCC)    DM (diabetes mellitus) (HCC)    Gastroparesis    HTN (hypertension)    Patient Active Problem List   Diagnosis Date Noted   DKA, type 1 (HCC) 09/18/2023   Gastroparesis due to DM (HCC) 04/28/2022   Gastroparesis 04/28/2022   Proliferative diabetic retinopathy of left eye with macular edema associated with type 1 diabetes mellitus (HCC) 12/15/2021   AKI (acute kidney injury) (HCC) 12/08/2021   Dehydration 12/08/2021   Leukocytosis 12/08/2021   HTN (hypertension)    Diabetes mellitus without complication (HCC)    Intractable nausea and vomiting 12/07/2021   Normocytic anemia 08/22/2021   DKA (diabetic ketoacidosis) (HCC) 05/27/2021   Microhematuria 02/10/2021   Persistent proteinuria 02/10/2021   Vitamin D deficiency 02/10/2021   Hypermetropia 12/13/2012   Home Medication(s) Prior to Admission medications   Medication Sig Start Date End Date Taking? Authorizing Provider  amLODipine (NORVASC) 10 MG tablet Take 1 tablet (10 mg total) by mouth daily. 12/10/21  Yes Gonfa, Taye T, MD  carvedilol (COREG) 12.5 MG tablet Take 12.5 mg by mouth as needed (pt does not recall).   Yes [provider]  Continuous Glucose Sensor (DEXCOM G6 SENSOR) MISC 1 each by Other route as directed. Change every 10 days. 02/28/24  Yes [provider]  Continuous Glucose Transmitter (DEXCOM G6  TRANSMITTER) MISC 1 each by Other route as directed. 11/14/23  Yes [provider]  dicyclomine (BENTYL) 10 MG capsule Take 10 mg by mouth as needed for spasms.   Yes [provider]  Elastic Bandages & Supports (MEDICAL COMPRESSION STOCKINGS) MISC Dispense 1 pair of knee-high compression stockings, to be used when you are continuously standing 03/26/21  Yes Pollina, Marine Sia, MD  ergocalciferol (VITAMIN D2) 1.25 MG (50000 UT) capsule Take 50,000 Units by mouth every Wednesday.   Yes [provider]  escitalopram (LEXAPRO) 5 MG tablet Take 5 mg by mouth as needed (anxiety). 07/17/22  Yes [provider]  Glucagon (BAQSIMI ONE PACK) 3 MG/DOSE POWD Place 1 spray into the nose as needed (for low BGL). 02/07/21  Yes [provider]  hydrALAZINE (APRESOLINE) 25 MG tablet Take 1 tablet (25 mg total) by mouth every 8 (eight) hours. Patient taking differently: Take 25 mg by mouth 3 (three) times daily. 04/30/22  Yes Regalado, Belkys A, MD  insulin aspart (NOVOLOG) 100 UNIT/ML FlexPen Inject 120 Units into the skin every 3 (three) days. Via insulin pump - 120 units every three days. 01/30/17  Yes [provider]  insulin degludec (TRESIBA) 100 UNIT/ML FlexTouch Pen Inject 26 Units into the skin daily. Use if insulin pump malfunctions. 05/23/22  Yes [provider]  Insulin Disposable Pump (OMNIPOD 5 G6 PODS, GEN 5,) MISC Inject 1 Device into the skin every 3 (three) days.   Yes [provider]  ondansetron (ZOFRAN-ODT) 4 MG disintegrating tablet Take 1 tablet (4 mg total) by mouth  every 8 (eight) hours as needed for nausea or vomiting. Patient taking differently: Take 4 mg by mouth as needed for nausea or vomiting. 01/10/24  Yes Tegeler, Canary Brim, MD  pantoprazole (PROTONIX) 40 MG tablet Take 1 tablet (40 mg total) by mouth daily. Patient taking differently: Take 40 mg by mouth as needed (heartburn). 04/30/22  Yes Regalado, Belkys A, MD   torsemide (DEMADEX) 10 MG tablet Take 10 mg by mouth as needed (swelling). 12/27/22  Yes [provider]  metoCLOPramide (REGLAN) 5 MG tablet Take 1 tablet (5 mg total) by mouth every 6 (six) hours as needed for nausea or vomiting. Patient not taking: Reported on 03/26/2024 04/16/23 03/26/24  Molpus, Jonny Ruiz, MD  potassium chloride SA (KLOR-CON) 20 MEQ tablet Take 1 tablet (20 mEq total) by mouth daily. 03/02/20 08/05/20  Petrucelli, Pleas Koch, PA-C                                                                                                                                    Past Surgical History Past Surgical History:  Procedure Laterality Date   ESOPHAGOGASTRODUODENOSCOPY     EYE SURGERY     Family History Family History  Problem Relation Age of Onset   Diabetes Mother    Diabetes Other     Social History Social History   Tobacco Use   Smoking status: Never    Passive exposure: Never   Smokeless tobacco: Never  Vaping Use   Vaping status: Every Day   Substances: Nicotine  Substance Use Topics   Alcohol use: Not Currently    Comment: occ   Drug use: Yes    Types: Marijuana    Comment: daily since 2017   Allergies Apple juice, Banana, and Gramineae pollens  Review of Systems Review of Systems  Physical Exam Vital Signs  I have reviewed the triage vital signs BP (!) 208/130   Pulse (!) 108   Temp 98.7 F (37.1 C) (Oral)   Resp (!) 25   Wt 59 kg   SpO2 97%   BMI 25.39 kg/m   Physical Exam Vitals reviewed.  HENT:     Head: Normocephalic.  Pulmonary:     Effort: Pulmonary effort is normal.  Abdominal:     General: Abdomen is flat. There is no distension.     Palpations: Abdomen is soft. There is no hepatomegaly or mass.     Tenderness: There is generalized abdominal tenderness.  Neurological:     Mental Status: He is alert.     ED Results and Treatments Labs (all labs ordered are listed, but only abnormal results are displayed) Labs Reviewed   COMPREHENSIVE METABOLIC PANEL WITH GFR - Abnormal; Notable for the following components:      Result Value   Glucose, Bld 175 (*)    BUN 34 (*)    Creatinine, Ser 3.53 (*)    Total Protein 8.8 (*)  GFR, Estimated 23 (*)    Anion gap 20 (*)    All other components within normal limits  CBC - Abnormal; Notable for the following components:   WBC 12.3 (*)    Hemoglobin 12.5 (*)    HCT 37.9 (*)    MCV 76.3 (*)    MCH 25.2 (*)    All other components within normal limits  URINALYSIS, ROUTINE W REFLEX MICROSCOPIC - Abnormal; Notable for the following components:   Glucose, UA >=500 (*)    Hgb urine dipstick LARGE (*)    Protein, ur >=300 (*)    All other components within normal limits  BETA-HYDROXYBUTYRIC ACID - Abnormal; Notable for the following components:   Beta-Hydroxybutyric Acid 1.55 (*)    All other components within normal limits  LACTIC ACID, PLASMA - Abnormal; Notable for the following components:   Lactic Acid, Venous 2.9 (*)    All other components within normal limits  URINALYSIS, MICROSCOPIC (REFLEX) - Abnormal; Notable for the following components:   Bacteria, UA FEW (*)    All other components within normal limits  CBG MONITORING, ED - Abnormal; Notable for the following components:   Glucose-Capillary 167 (*)    All other components within normal limits  I-STAT VENOUS BLOOD GAS, ED - Abnormal; Notable for the following components:   pH, Ven 7.546 (*)    pCO2, Ven 32.6 (*)    pO2, Ven 52 (*)    Bicarbonate 28.3 (*)    Acid-Base Excess 6.0 (*)    Calcium, Ion 1.04 (*)    HCT 38.0 (*)    Hemoglobin 12.9 (*)    All other components within normal limits  LIPASE, BLOOD  LACTIC ACID, PLASMA                                                                                                                          Radiology No results found.  Pertinent labs & imaging results that were available during my care of the patient were reviewed by me and considered in  my medical decision making (see MDM for details).  Medications Ordered in ED Medications  labetalol (NORMODYNE) injection 10 mg (has no administration in time range)  lactated ringers bolus 1,000 mL (0 mLs Intravenous Stopped 03/26/24 1211)  droperidol (INAPSINE) 2.5 MG/ML injection 1.25 mg (1.25 mg Intravenous Given 03/26/24 1010)  fentaNYL (SUBLIMAZE) injection 50 mcg (50 mcg Intravenous Given 03/26/24 1029)  lactated ringers bolus 1,000 mL (0 mLs Intravenous Stopped 03/26/24 1304)  droperidol (INAPSINE) 2.5 MG/ML injection 1.25 mg (1.25 mg Intravenous Given 03/26/24 1335)  hydrALAZINE (APRESOLINE) injection 10 mg (10 mg Intravenous Given 03/26/24 1334)  Procedures Procedures  (including critical care time)  Medical Decision Making / ED Course   This patient presents to the ED for concern of abdominal pain and vomiting, this involves an extensive number of treatment options, and is a complaint that carries with it a high risk of complications and morbidity.  The differential diagnosis includes gastroparesis, DKA, dehydration, less likely acute intra-abdominal surgical process.  MDM: With the patient's history, believe this is likely gastroparesis.  Will try to treat the patient with droperidol, analgesia.  Will check patient for signs of DKA.  I am reassured that the patient has been using his glucometer and insulin pump as instructed.  Reassessment 1:50 PM-patient's renal function is at his baseline.  Hemoglobin improved from prior.  Patient with a lactic acidosis of 2.9.  Do not believe patient has sepsis at this time.  I believe the patient's symptoms are gastroparesis, as this is his chronic issue, and dehydration.  Patient remains afebrile, has been hypertensive due to not being able to take his home BP meds.  I provided hydralazine IV.  Patient has a mildly  elevated beta hydroxybutyrate, however he has no ketones in his urine, elevated bicarbonate, no acidosis, and a gap of 20 which I believe is related to his lactic acid.  Patient is required multiple rounds of antiemetics without relief.  Remains tachycardic, unable to tolerate p.o.  With his diabetes, believe he is too risky to send home.  Will continue to fluid resuscitate the patient, will admit him to hospitalist for gastroparesis, dehydration, inability to eat.   Additional history obtained: -Additional history obtained from mother at bedside -External records from outside source obtained and reviewed including: Chart review including previous notes, labs, imaging, consultation notes   Lab Tests: -I ordered, reviewed, and interpreted labs.   The pertinent results include:   Labs Reviewed  COMPREHENSIVE METABOLIC PANEL WITH GFR - Abnormal; Notable for the following components:      Result Value   Glucose, Bld 175 (*)    BUN 34 (*)    Creatinine, Ser 3.53 (*)    Total Protein 8.8 (*)    GFR, Estimated 23 (*)    Anion gap 20 (*)    All other components within normal limits  CBC - Abnormal; Notable for the following components:   WBC 12.3 (*)    Hemoglobin 12.5 (*)    HCT 37.9 (*)    MCV 76.3 (*)    MCH 25.2 (*)    All other components within normal limits  URINALYSIS, ROUTINE W REFLEX MICROSCOPIC - Abnormal; Notable for the following components:   Glucose, UA >=500 (*)    Hgb urine dipstick LARGE (*)    Protein, ur >=300 (*)    All other components within normal limits  BETA-HYDROXYBUTYRIC ACID - Abnormal; Notable for the following components:   Beta-Hydroxybutyric Acid 1.55 (*)    All other components within normal limits  LACTIC ACID, PLASMA - Abnormal; Notable for the following components:   Lactic Acid, Venous 2.9 (*)    All other components within normal limits  URINALYSIS, MICROSCOPIC (REFLEX) - Abnormal; Notable for the following components:   Bacteria, UA FEW (*)     All other components within normal limits  CBG MONITORING, ED - Abnormal; Notable for the following components:   Glucose-Capillary 167 (*)    All other components within normal limits  I-STAT VENOUS BLOOD GAS, ED - Abnormal; Notable for the following components:   pH, Ven 7.546 (*)  pCO2, Ven 32.6 (*)    pO2, Ven 52 (*)    Bicarbonate 28.3 (*)    Acid-Base Excess 6.0 (*)    Calcium, Ion 1.04 (*)    HCT 38.0 (*)    Hemoglobin 12.9 (*)    All other components within normal limits  LIPASE, BLOOD  LACTIC ACID, PLASMA      EKG   EKG Interpretation Date/Time:    Ventricular Rate:    PR Interval:    QRS Duration:    QT Interval:    QTC Calculation:   R Axis:      Text Interpretation:           Imaging Studies ordered: I ordered imaging studies including  I independently visualized and interpreted imaging. I agree with the radiologist interpretation   Medicines ordered and prescription drug management: Meds ordered this encounter  Medications   lactated ringers bolus 1,000 mL   droperidol (INAPSINE) 2.5 MG/ML injection 1.25 mg   fentaNYL (SUBLIMAZE) injection 50 mcg   lactated ringers bolus 1,000 mL   droperidol (INAPSINE) 2.5 MG/ML injection 1.25 mg   hydrALAZINE (APRESOLINE) injection 10 mg   labetalol (NORMODYNE) injection 10 mg    -I have reviewed the patients home medicines and have made adjustments as needed   Cardiac Monitoring: The patient was maintained on a cardiac monitor.  I personally viewed and interpreted the cardiac monitored which showed an underlying rhythm of: Tachycardia  Social Determinants of Health:  Factors impacting patients care include: Multiple medical comorbidities including chronic kidney disease, gastroparesis, diabetes   Reevaluation: After the interventions noted above, I reevaluated the patient and found that they have :improved  Co morbidities that complicate the patient evaluation  Past Medical History:  Diagnosis  Date   Diabetic retinopathy (HCC)    DM (diabetes mellitus) (HCC)    Gastroparesis    HTN (hypertension)       Dispostion: Admission     Final Clinical Impression(s) / ED Diagnoses Final diagnoses:  Nausea and vomiting, unspecified vomiting type  Dehydration  Gastroparesis     @PCDICTATION @    Afton Horse T, DO 03/26/24 1358

## 2024-03-26 NOTE — H&P (Signed)
 History and Physical    Anthony Graham VHQ:[id number] DOB: 04-Jun-1996 DOA: 03/26/2024  PCP: Pcp, No   Chief Complaint: abd pain  HPI: Anthony Graham is a 28 y.o. male with medical history significant of type I he is on insulin pump, gastroparesis who presented [facility] due to abdominal pain and vomiting.  Patient states his insulin pump has been functioning properly however he has been unable to keep p.o. intake down.  He presented to outside hospital where he was found to be tachycardic in the low 100s and hypertensive with systolics over 200.  Labs were obtained on presentation which showed glucose 175, creatinine 3.5, WBC 12.3, hemoglobin 12.5, urinalysis with protein.  Beta-hydroxybutyrate 1.5, lactic acid 2.9.  There was concern patient was having gastroparesis flare as well as sepsis with his lactic acidosis and he was transferred to [facility] for further management.  Evaluation he was persistently hypertensive.  His lactic acid improved to 1.9 after volume resuscitation.  He was empirically started on vancomycin and cefepime and infectious workup was initiated.   Review of Systems: Review of Systems  Constitutional: Negative.  Negative for chills and fever.  HENT: Negative.    Eyes: Negative.   Respiratory: Negative.    Cardiovascular: Negative.   Gastrointestinal:  Positive for abdominal pain, heartburn, nausea and vomiting.  Genitourinary: Negative.   Musculoskeletal: Negative.   Skin: Negative.   Neurological: Negative.   Endo/Heme/Allergies: Negative.   Psychiatric/Behavioral: Negative.       As per HPI otherwise 10 point review of systems negative.   Allergies  Allergen Reactions   Apple Juice Itching   Banana Itching   Gramineae Pollens Other (See Comments)    "Sore throat, runny nose, sneezing"    Past Medical History:  Diagnosis Date   Diabetic retinopathy (HCC)    DM (diabetes mellitus) (HCC)    Gastroparesis    HTN (hypertension)      Past Surgical History:  Procedure Laterality Date   ESOPHAGOGASTRODUODENOSCOPY     EYE SURGERY       reports that he has never smoked. He has never been exposed to tobacco smoke. He has never used smokeless tobacco. He reports that he does not currently use alcohol. He reports current drug use. Drug: Marijuana.  Family History  Problem Relation Age of Onset   Diabetes Mother    Diabetes Other     Prior to Admission medications   Medication Sig Start Date End Date Taking? Authorizing Provider  amLODipine (NORVASC) 10 MG tablet Take 1 tablet (10 mg total) by mouth daily. 12/10/21  Yes Gonfa, Taye T, MD  carvedilol (COREG) 12.5 MG tablet Take 12.5 mg by mouth as needed (pt does not recall).   Yes [provider]  Continuous Glucose Sensor (DEXCOM G6 SENSOR) MISC 1 each by Other route as directed. Change every 10 days. 02/28/24  Yes [provider]  Continuous Glucose Transmitter (DEXCOM G6 TRANSMITTER) MISC 1 each by Other route as directed. 11/14/23  Yes [provider]  dicyclomine (BENTYL) 10 MG capsule Take 10 mg by mouth as needed for spasms.   Yes [provider]  Elastic Bandages & Supports (MEDICAL COMPRESSION STOCKINGS) MISC Dispense 1 pair of knee-high compression stockings, to be used when you are continuously standing 03/26/21  Yes Pollina, Marine Sia, MD  ergocalciferol (VITAMIN D2) 1.25 MG (50000 UT) capsule Take 50,000 Units by mouth every Wednesday.   Yes [provider]  escitalopram (LEXAPRO) 5 MG tablet Take  5 mg by mouth as needed (anxiety). 07/17/22  Yes [provider]  Glucagon (BAQSIMI ONE PACK) 3 MG/DOSE POWD Place 1 spray into the nose as needed (for low BGL). 02/07/21  Yes [provider]  hydrALAZINE (APRESOLINE) 25 MG tablet Take 1 tablet (25 mg total) by mouth every 8 (eight) hours. Patient taking differently: Take 25 mg by mouth 3 (three) times daily. 04/30/22  Yes Regalado, Belkys A, MD  insulin  aspart (NOVOLOG) 100 UNIT/ML FlexPen Inject 120 Units into the skin every 3 (three) days. Via insulin pump - 120 units every three days. 01/30/17  Yes [provider]  insulin degludec (TRESIBA) 100 UNIT/ML FlexTouch Pen Inject 26 Units into the skin daily. Use if insulin pump malfunctions. 05/23/22  Yes [provider]  Insulin Disposable Pump (OMNIPOD 5 G6 PODS, GEN 5,) MISC Inject 1 Device into the skin every 3 (three) days.   Yes [provider]  ondansetron (ZOFRAN-ODT) 4 MG disintegrating tablet Take 1 tablet (4 mg total) by mouth every 8 (eight) hours as needed for nausea or vomiting. Patient taking differently: Take 4 mg by mouth as needed for nausea or vomiting. 01/10/24  Yes Tegeler, Canary Brim, MD  pantoprazole (PROTONIX) 40 MG tablet Take 1 tablet (40 mg total) by mouth daily. Patient taking differently: Take 40 mg by mouth as needed (heartburn). 04/30/22  Yes Regalado, Belkys A, MD  torsemide (DEMADEX) 10 MG tablet Take 10 mg by mouth as needed (swelling). 12/27/22  Yes [provider]  metoCLOPramide (REGLAN) 5 MG tablet Take 1 tablet (5 mg total) by mouth every 6 (six) hours as needed for nausea or vomiting. Patient not taking: Reported on 03/26/2024 04/16/23 03/26/24  Molpus, Jonny Ruiz, MD  potassium chloride SA (KLOR-CON) 20 MEQ tablet Take 1 tablet (20 mEq total) by mouth daily. 03/02/20 08/05/20  Cherly Anderson, PA-C    Physical Exam: Vitals:   03/26/24 1500 03/26/24 1530 03/26/24 1631 03/26/24 1700  BP: (!) 185/115 (!) 191/95 (!) 228/134 (!) 214/110  Pulse: (!) 125 (!) 126  (!) 131  Resp: 14   18  Temp:      TempSrc:      SpO2: 98% 97%  96%  Weight:       Physical Exam Constitutional:      Appearance: He is normal weight.  HENT:     Head: Normocephalic.     Mouth/Throat:     Mouth: Mucous membranes are moist.  Eyes:     Extraocular Movements: Extraocular movements intact.  Cardiovascular:     Rate and Rhythm: Normal rate and regular  rhythm.     Heart sounds: Normal heart sounds.  Abdominal:     General: Abdomen is flat. Bowel sounds are normal.     Palpations: Abdomen is soft.     Tenderness: There is generalized abdominal tenderness.  Skin:    General: Skin is warm.     Capillary Refill: Capillary refill takes less than 2 seconds.  Neurological:     General: No focal deficit present.     Mental Status: He is alert.  Psychiatric:        Mood and Affect: Mood normal.       Labs on Admission: I have personally reviewed the patients's labs and imaging studies.  Assessment/Plan Principal Problem:   Gastroparesis   # Nausea and vomiting most likely secondary to gastroparesis flare. - Patient has history of type 1 diabetes it is controlled with insulin pump - Numerous prior  presentations with similar complaints -History of cannabis hyperemesis  Plan: Supportive care Continue insulin pump for advance diet as tolerated  # Type 1 diabetes on insulin pump.  We are continuing to pump usage.  Glucoses are under control we will plan to transition to drip  # Hypertensive urgency-patient w/  blood pressures over 200 despite as needed and p.o. antihypertensives.  Will plan to place on Cardene drip and resume home antihypertensives  # Sepsis, unclear etiology-patient responded to IV fluids.  Will plan to empirically start on vancomycin and cefepime and obtain CT chest on pelvis as well as cultures and urine studies   Admission status: Inpatient Stepdown  Certification: The appropriate patient status for this patient is INPATIENT. Inpatient status is judged to be reasonable and necessary in order to provide the required intensity of service to ensure the patient's safety. The patient's presenting symptoms, physical exam findings, and initial radiographic and laboratory data in the context of their chronic comorbidities is felt to place them at high risk for further clinical deterioration. Furthermore, it is not  anticipated that the patient will be medically stable for discharge from the hospital within 2 midnights of admission.   * I certify that at the point of admission it is my clinical judgment that the patient will require inpatient hospital care spanning beyond 2 midnights from the point of admission due to high intensity of service, high risk for further deterioration and high frequency of surveillance required.Myrl Askew MD [facility] If 7PM-7AM, please contact night-coverage [email or web address]  03/26/2024, 5:53 PM

## 2024-03-26 NOTE — Treatment Plan (Signed)
 [facility] to [facility] Hospitalist Service  28 yo with hx T1DM, gastroparesis presenting with abdominal pain and vomiting thought to be due to presumed recurrent gastroparesis flare.  Discussed with EDP, abdomen benign on exam, he reports similar symptoms to prior gastroparesis episodes.  Vitals notable for tachycardia, tachypnea, and significant hypertension (recently 180's over 90's after IV hydralazine).  Labs with venous blood gas with respiratory alkalosis, lactic acidosis, mild hyperglycemia, AG 20, elevated creatinine (similar to priors), and elevated total protein.  Labs notable for leukocytosis, mild anemia.  Elevated beta hydroxybutyric acid.  UA without ketones, notable for protein and microscopic hematuria.    EDP requesting admission for gastroparesis flare.  Will place orders.  Notably, currently with respiratory alkalosis with concomitant metabolic acidosis.  Metabolic acidosis I suspect is more likely due to the lactic acidosis and his chronic kidney disease with only mild hyperglycemia and negative urine ketones, but does notably have positive beta hydroxybutyric acid (his previous admission for DKA, his BG was >1200).  For now, will have him continue his insulin pump, which has been functioning appropriately, but will check repeat BMP to ensure labs trending in right direction.  Would have low threshold to start insulin gtt.

## 2024-03-27 ENCOUNTER — Inpatient Hospital Stay (HOSPITAL_COMMUNITY)

## 2024-03-27 ENCOUNTER — Encounter (HOSPITAL_COMMUNITY)

## 2024-03-27 DIAGNOSIS — K3184 Gastroparesis: Secondary | ICD-10-CM | POA: Diagnosis not present

## 2024-03-27 LAB — PROTEIN / CREATININE RATIO, URINE
Creatinine, Urine: 123 mg/dL
Protein Creatinine Ratio: 2.22 mg/mg{creat} — ABNORMAL HIGH (ref 0.00–0.15)
Total Protein, Urine: 273 mg/dL

## 2024-03-27 LAB — URINALYSIS, W/ REFLEX TO CULTURE (INFECTION SUSPECTED)
Bilirubin Urine: NEGATIVE
Glucose, UA: >=500 mg/dL — AB
Ketones, ur: 5 mg/dL — AB
Leukocytes,Ua: NEGATIVE
Nitrite: NEGATIVE
Protein, ur: >=300 mg/dL — AB
Specific Gravity, Urine: 1.012 (ref 1.005–1.030)
pH: 5 (ref 5.0–8.0)

## 2024-03-27 LAB — GLUCOSE, CAPILLARY
Glucose-Capillary: 188 mg/dL — ABNORMAL HIGH (ref 70–99)
Glucose-Capillary: 190 mg/dL — ABNORMAL HIGH (ref 70–99)
Glucose-Capillary: 162 mg/dL — ABNORMAL HIGH (ref 70–99)
Glucose-Capillary: 172 mg/dL — ABNORMAL HIGH (ref 70–99)
Glucose-Capillary: 209 mg/dL — ABNORMAL HIGH (ref 70–99)
Glucose-Capillary: 233 mg/dL — ABNORMAL HIGH (ref 70–99)

## 2024-03-27 LAB — BASIC METABOLIC PANEL WITH GFR
Anion gap: 12 (ref 5–15)
BUN: 34 mg/dL — ABNORMAL HIGH (ref 6–20)
CO2: 23 mmol/L (ref 22–32)
Calcium: 8.4 mg/dL — ABNORMAL LOW (ref 8.9–10.3)
Chloride: 104 mmol/L (ref 98–111)
Creatinine, Ser: 3.18 mg/dL — ABNORMAL HIGH (ref 0.61–1.24)
GFR, Estimated: 27 mL/min — ABNORMAL LOW (ref >60)
Glucose, Bld: 179 mg/dL — ABNORMAL HIGH (ref 70–99)
Potassium: 4 mmol/L (ref 3.5–5.1)
Sodium: 139 mmol/L (ref 135–145)

## 2024-03-27 LAB — CBC
HCT: 34.1 % — ABNORMAL LOW (ref 39.0–52.0)
Hemoglobin: 10.8 g/dL — ABNORMAL LOW (ref 13.0–17.0)
MCH: 24.9 pg — ABNORMAL LOW (ref 26.0–34.0)
MCHC: 31.7 g/dL (ref 30.0–36.0)
MCV: 78.6 fL — ABNORMAL LOW (ref 80.0–100.0)
Platelets: 217 10*3/uL (ref 150–400)
RBC: 4.34 MIL/uL (ref 4.22–5.81)
RDW: 14 % (ref 11.5–15.5)
WBC: 16.7 10*3/uL — ABNORMAL HIGH (ref 4.0–10.5)
nRBC: 0 % (ref 0.0–0.2)

## 2024-03-27 LAB — MRSA NEXT GEN BY PCR, NASAL: MRSA by PCR Next Gen: NOT DETECTED

## 2024-03-27 LAB — TSH: TSH: 1.899 u[IU]/mL (ref 0.350–4.500)

## 2024-03-27 MED ORDER — HYDRALAZINE HCL 20 MG/ML IJ SOLN: 5.0000 mg | INTRAMUSCULAR | Status: DC | PRN

## 2024-03-27 MED ORDER — LACTATED RINGERS IV SOLN: INTRAVENOUS | Status: AC

## 2024-03-27 MED ORDER — PROMETHAZINE HCL 25 MG PO TABS
25.0000 mg | ORAL_TABLET | Freq: Four times a day (QID) | ORAL | Status: DC | PRN
Start: 2024-03-27 — End: 2024-03-28

## 2024-03-27 MED ORDER — SODIUM CHLORIDE 0.9 % IV SOLN: 12.5000 mg | Freq: Four times a day (QID) | INTRAVENOUS | Status: DC | PRN

## 2024-03-27 MED ORDER — OXYCODONE HCL 5 MG PO TABS: 10.0000 mg | ORAL_TABLET | ORAL | Status: DC | PRN

## 2024-03-27 MED ORDER — METOCLOPRAMIDE HCL 5 MG/ML IJ SOLN
10.0000 mg | Freq: Four times a day (QID) | INTRAMUSCULAR | Status: DC
  Administered 2024-03-27 – 2024-03-28 (×5): 10 mg via INTRAVENOUS
  Filled 2024-03-27 (×5): qty 2

## 2024-03-27 MED ORDER — HYDRALAZINE HCL 50 MG PO TABS
50.0000 mg | ORAL_TABLET | Freq: Three times a day (TID) | ORAL | Status: DC
  Administered 2024-03-27 – 2024-03-28 (×4): 50 mg via ORAL
  Filled 2024-03-27 (×4): qty 1

## 2024-03-27 MED ORDER — CARVEDILOL 12.5 MG PO TABS
12.5000 mg | ORAL_TABLET | Freq: Once | ORAL | Status: DC
  Filled 2024-03-27: qty 1

## 2024-03-27 MED ORDER — PROMETHAZINE HCL 25 MG RE SUPP: 25.0000 mg | Freq: Four times a day (QID) | RECTAL | Status: DC | PRN

## 2024-03-27 MED ORDER — HYDRALAZINE HCL 25 MG PO TABS
25.0000 mg | ORAL_TABLET | Freq: Once | ORAL | Status: DC
  Filled 2024-03-27: qty 1

## 2024-03-27 MED ORDER — CAPSAICIN 0.025 % EX CREA
TOPICAL_CREAM | Freq: Two times a day (BID) | CUTANEOUS | Status: DC
Start: 2024-03-27 — End: 2024-03-28
  Filled 2024-03-27: qty 60

## 2024-03-27 MED ORDER — CARVEDILOL 12.5 MG PO TABS
25.0000 mg | ORAL_TABLET | Freq: Two times a day (BID) | ORAL | Status: DC
  Administered 2024-03-27 – 2024-03-28 (×3): 25 mg via ORAL
  Filled 2024-03-27 (×3): qty 2

## 2024-03-27 MED ORDER — LABETALOL HCL 5 MG/ML IV SOLN: 5.0000 mg | INTRAVENOUS | Status: DC | PRN

## 2024-03-27 MED ORDER — OXYCODONE HCL 5 MG PO TABS
5.0000 mg | ORAL_TABLET | ORAL | Status: DC | PRN
  Administered 2024-03-27: 5 mg via ORAL
  Filled 2024-03-27: qty 1

## 2024-03-27 MED ORDER — INSULIN ASPART 100 UNIT/ML IJ SOLN
2.0000 [IU] | Freq: Once | INTRAMUSCULAR | Status: AC
  Administered 2024-03-27: 2 [IU] via SUBCUTANEOUS

## 2024-03-27 NOTE — Progress Notes (Signed)
 PROGRESS NOTE    Anthony Graham  OZH:[id number] DOB: 1996-04-25 DOA: 03/26/2024 PCP: Pcp, No  Chief Complaint  Patient presents with   Emesis   Abdominal Pain    Brief Narrative:   28 yo with hx T1DM, CKD, gastroparesis who presented to [facility] with recurrent abdominal pain, nausea, and vomiting that he described as similar to previous episodes of gastroparesis.    He was admitted for suspected gastroparesis flare.    Assessment & Plan:   Principal Problem:   Gastroparesis  Abdominal Pain  Nausea  Vomiting Gastroparesis Flare  Symptoms typical of prior episodes of gastroparesis (he notes regular cannabis use, possible cannabinoid hyperemesis) CT notable for small hiatal hernia Warrants gastric emptying study outpatient Will continue symptomatic management - scheduled reglan.  Prn antiemetics.  RD for education regarding gastroparesis diet.  T1DM Continue pump for now BG's ok  Systemic Inflammatory Response Syndrome Sepsis ruled out  Tachycardia, tachypnea, and leukocytosis - unclear cause, likely due to above (hypovolemia?) Low suspicion for infectious cause -> CT CAP without source identified, UA not concerning for UA, blood cultures pending WBC up today, unclear cause, will trend  Anion Gap Metabolic Acidosis  Lactic Acidosis  Resolved, suspect related to lactic acidosis and AKI on CKD  Severe Asymptomatic Hypertension No apparent end organ damage at this point No HA, vision changes.  Main symptoms related to suspect gastroparesis flare above.  Possibly related to discomfort from abdominal discomfort and nausea, will treat symptoms. Continue oral meds as able to tolerate -> titrate  Prn meds for severe hypertension - will d/c gtt for now Needs workup for secondary causes of hypertension: renin/aldo, urine metanephrines/catecholamines, TSH, UP/C, renal artery US  Needs follow up outpatient with nephrology  AKI on CKD IIIb  Proteinuria Baseline creatinine in 2024  appears to be ~2.4 (was ~4 in 12/2023, but at presentation lower than that, not clear what his most recent true baseline is) Currently downtrending with IVF CT with no hydro Urine with protein, follow UP/C     DVT prophylaxis: lovenox Code Status: full Family Communication: none Disposition:   Status is: Inpatient Remains inpatient appropriate because: continued symptoms   Consultants:  none  Procedures:  none  Antimicrobials:  Anti-infectives (From admission, onward)    Start     Dose/Rate Route Frequency Ordered Stop   03/28/24 1700  vancomycin (VANCOCIN) IVPB 1000 mg/200 mL premix  Status:  Discontinued        1,000 mg 200 mL/hr over 60 Minutes Intravenous Every 48 hours 03/26/24 1753 03/27/24 0715   03/27/24 1700  ceFEPIme (MAXIPIME) 2 g in sodium chloride 0.9 % 100 mL IVPB  Status:  Discontinued        2 g 200 mL/hr over 30 Minutes Intravenous Every 24 hours 03/26/24 1753 03/27/24 0715   03/26/24 1745  ceFEPIme (MAXIPIME) 2 g in sodium chloride 0.9 % 100 mL IVPB        2 g 200 mL/hr over 30 Minutes Intravenous  Once 03/26/24 1650 03/26/24 1746   03/26/24 1745  vancomycin (VANCOCIN) IVPB 1000 mg/200 mL premix        1,000 mg 200 mL/hr over 60 Minutes Intravenous  Once 03/26/24 1652 03/26/24 1836       Subjective: Abdominal discomfort, nausea, vomiting returned this AM  Objective: Vitals:   03/27/24 0545 03/27/24 0600 03/27/24 0700 03/27/24 0800  BP: (!) 144/79 138/76 (!) 169/112 (!) 155/103  Pulse: (!) 108 (!) 107 (!) 112 (!) 102  Resp: 14 15 20  Aaron Aas)  23  Temp:    98.4 F (36.9 C)  TempSrc:    Oral  SpO2: 96% 98% 98% 97%  Weight:      Height:        Intake/Output Summary (Last 24 hours) at 03/27/2024 0920 Last data filed at 03/27/2024 1610 Gross per 24 hour  Intake 5972.88 ml  Output --  Net 5972.88 ml   Filed Weights   03/26/24 0940 03/26/24 2000  Weight: 59 kg 59.7 kg    Examination:  General exam: appears generally uncomfortable Respiratory  system: unlabored Cardiovascular system: tachycardia (sinus on tele) Gastrointestinal system: Abdomen is nondistended, soft and nontender. Central nervous system: Alert and oriented. No focal neurological deficits. Extremities: no LEE    Data Reviewed: I have personally reviewed following labs and imaging studies  CBC: Recent Labs  Lab 03/26/24 0941 03/26/24 1032 03/27/24 0259  WBC 12.3*  --  16.7*  HGB 12.5* 12.9* 10.8*  HCT 37.9* 38.0* 34.1*  MCV 76.3*  --  78.6*  PLT 264  --  217    Basic Metabolic Panel: Recent Labs  Lab 03/26/24 0941 03/26/24 1032 03/26/24 1641 03/27/24 0259  NA 145 144 141 139  K 4.1 4.6 3.7 4.0  CL 102  --  103 104  CO2 23  --  25 23  GLUCOSE 175*  --  177* 179*  BUN 34*  --  35* 34*  CREATININE 3.53*  --  3.34* 3.18*  CALCIUM 9.9  --  9.0 8.4*    GFR: Estimated Creatinine Clearance: 29.7 mL/min (Jaizon Deroos) (by C-G formula based on SCr of 3.18 mg/dL (H)).  Liver Function Tests: Recent Labs  Lab 03/26/24 0941  AST 38  ALT 24  ALKPHOS 108  BILITOT 1.1  PROT 8.8*  ALBUMIN 4.4    CBG: Recent Labs  Lab 03/26/24 0945 03/26/24 1625 03/26/24 2113 03/27/24 0140 03/27/24 0733  GLUCAP 167* 184* 241* 188* 190*     Recent Results (from the past 240 hours)  Culture, blood (Routine X 2) w Reflex to ID Panel     Status: None (Preliminary result)   Collection Time: 03/26/24  7:18 PM   Specimen: BLOOD RIGHT ARM  Result Value Ref Range Status   Specimen Description   Final    BLOOD RIGHT ARM Performed at [facility], [address], [place], [place] [address]    Special Requests   Final    BOTTLES DRAWN AEROBIC AND ANAEROBIC Blood Culture results may not be optimal due to an inadequate volume of blood received in culture bottles Performed at [facility], [address] [place], [place] [address]    Culture   Final    NO GROWTH < 12 HOURS Performed at [facility], [address], [place], [place]  [address]    Report Status PENDING  Incomplete  Culture, blood (Routine X 2) w Reflex to ID Panel     Status: None (Preliminary result)   Collection Time: 03/26/24  7:23 PM   Specimen: BLOOD RIGHT ARM  Result Value Ref Range Status   Specimen Description   Final    BLOOD RIGHT ARM Performed at [facility], [address], [place], [place] [address]    Special Requests   Final    BOTTLES DRAWN AEROBIC ONLY Blood Culture results may not be optimal due to an inadequate volume of blood received in culture bottles Performed at [facility], [address] [place],  [place] [address]    Culture   Final    NO GROWTH < 12 HOURS Performed at [facility], [address] [place], [place] [address]    Report Status PENDING  Incomplete  MRSA Next Gen by PCR, Nasal     Status: None   Collection Time: 03/27/24  3:39 AM   Specimen: Nasal Mucosa; Nasal Swab  Result Value Ref Range Status   MRSA by PCR Next Gen NOT DETECTED NOT DETECTED Final    Comment: (NOTE) The GeneXpert MRSA Assay (FDA approved for NASAL specimens only), is one component of [place]ather [place] comprehensive MRSA colonization surveillance program. It is not intended to diagnose MRSA infection nor to guide or monitor treatment for MRSA infections. Test performance is not FDA approved in patients less than 46 years old. Performed at [facility], [address] [place], [place] [address]          Radiology Studies: CT CHEST ABDOMEN PELVIS WO CONTRAST Result Date: 03/27/2024 CLINICAL DATA:  Sepsis, abdominal pain, nausea, vomiting, leukocytosis EXAM: CT CHEST, ABDOMEN AND PELVIS WITHOUT CONTRAST TECHNIQUE: Multidetector CT imaging of the chest, abdomen and pelvis was performed following the standard protocol without IV contrast. RADIATION DOSE REDUCTION: This exam was performed according to the departmental dose-optimization program which includes automated exposure control, adjustment of the  mA and/or kV according to patient size and/or use of iterative reconstruction technique. COMPARISON:  09/18/2023 FINDINGS: CT CHEST FINDINGS Cardiovascular: No significant vascular findings. Normal heart size. No pericardial effusion. Hypoattenuation of the cardiac blood pool in keeping with at least mild anemia. Mediastinum/Nodes: No enlarged mediastinal, hilar, or axillary lymph nodes. Thyroid gland, trachea, and esophagus demonstrate no significant findings. Small hiatal hernia Lungs/Pleura: Lungs are clear. No pleural effusion or pneumothorax. Musculoskeletal: No chest wall mass or suspicious bone lesions identified. CT ABDOMEN PELVIS FINDINGS Hepatobiliary: No focal liver abnormality is seen. Previously noted hepatic steatosis has resolved. No gallstones, gallbladder wall thickening, or biliary dilatation. Pancreas: Unremarkable Spleen: Unremarkable Adrenals/Urinary Tract: The adrenal glands are unremarkable. The kidneys are normal in size and position. Note is made of Dajuan Turnley left extrarenal pelvis. No hydronephrosis. No intrarenal or ureteral calculi. Mild bilateral perinephric stranding is stable, nonspecific. The bladder is unremarkable. Stomach/Bowel: Stomach is within normal limits. Appendix appears normal. No evidence of bowel wall thickening, distention, or inflammatory changes. Vascular/Lymphatic: No significant vascular findings are present. No enlarged abdominal or pelvic lymph nodes. Reproductive: Prostate is unremarkable. Other: No abdominal wall hernia or abnormality. No abdominopelvic ascites. Musculoskeletal: No acute or significant osseous findings. IMPRESSION: 1. Hypoattenuation of the cardiac blood pool in keeping with at least mild anemia. 2. Small hiatal hernia. Electronically Signed   By: Helyn Numbers M.D.   On: 03/27/2024 01:34        Scheduled Meds:  amLODipine  10 mg Oral Daily   capsaicin   Topical BID   carvedilol  12.5 mg Oral Once   carvedilol  25 mg Oral BID WC    Chlorhexidine Gluconate Cloth  6 each Topical Daily   enoxaparin (LOVENOX) injection  30 mg Subcutaneous Q24H   escitalopram  5 mg Oral QHS   hydrALAZINE  25 mg Oral Once   hydrALAZINE  50 mg Oral TID   insulin pump   Subcutaneous TID WC, HS, 0200   metoCLOPramide (REGLAN) injection  10 mg Intravenous Q6H   Continuous Infusions:  niCARDipine Stopped (03/27/24 0600)   promethazine (PHENERGAN) injection (IM or IVPB)  LOS: 1 day    Time spent: over 30 min    Donnetta Gains, MD [facility]   To contact the attending provider between 7A-7P or the covering provider during after hours 7P-7A, please log into the web site [email or web address] and access using universal [place] password for that web site. If you do not have the password, please call the hospital operator.  03/27/2024, 9:20 AM

## 2024-03-27 NOTE — Plan of Care (Signed)
 Nutrition Education Note  RD consulted for nutrition education regarding gastroparesis.  Patient reports he typically only eats 1 meal a day (between 2-8pm) and snacks the rest of the day.  Commonly eats McDonalds or japanese hibachi.  RD provided "Gastroparesis Nutrition Therapy" handout from the Academy of Nutrition and Dietetics. Reviewed patient's dietary recall. Advised patient to eat multiple small meals, avoid high fat foods, and avoid high fiber foods. Advised patient to chew foods well before swallowing, drink fluids throughout meals, and to sit up or walk after meals if possible. Gave patient list of foods recommended as well as those not recommended.  Teach back method used.  Expect fair compliance.  Body mass index is 21.24 kg/m.   Current diet order is Carb Modified, no % of meals documented at this time. Patient not interested in trying any nutrition supplements right now.  Labs and medications reviewed. No further nutrition interventions warranted at this time. RD contact information provided. If additional nutrition issues arise, please re-consult RD.   Scheryl Cushing RD, LDN Contact via [employer].

## 2024-03-27 NOTE — TOC Initial Note (Signed)
 Transition of Care [facility]) - Initial/Assessment Note    Patient Details  Name: Anthony Graham MRN: [id number] Date of Birth: Jan 26, 1996  Transition of Care [facility]) CM/SW Contact:    Anthony Ream, RN Phone Number: 03/27/2024, 10:43 AM  Clinical Narrative:                 No PCP listed; spoke w/ pt in room; pt says he lives at home w/ his mother and brother; he plans to return at d/c; pt identified POC mother Anthony Graham [phone]); she will provide transportation; pt says his PCP is w/ [facility]; he verified insurance; pt denied SDOH risks; pt says he does not have DME, HH services, or home oxygen; TOC will follow.  Expected Discharge Plan: Home/Self Care Barriers to Discharge: Continued Medical Work up   Patient Goals and CMS Choice Patient states their goals for this hospitalization and ongoing recovery are:: home CMS Medicare.gov Compare Post Acute Care list provided to:: Patient        Expected Discharge Plan and Services   Discharge Planning Services: CM Consult   Living arrangements for the past 2 months: Apartment                                      Prior Living Arrangements/Services Living arrangements for the past 2 months: Apartment Lives with:: Relatives Patient language and need for interpreter reviewed:: Yes Do you feel safe going back to the place where you live?: Yes      Need for Family Participation in Patient Care: Yes (Comment) Care giver support system in place?: Yes (comment) Current home services:  (n/a) Criminal Activity/Legal Involvement Pertinent to Current Situation/Hospitalization: No - Comment as needed  Activities of Daily Living   ADL Screening (condition at time of admission) Independently performs ADLs?: Yes (appropriate for developmental age) Is the patient deaf or have difficulty hearing?: No Does the patient have difficulty seeing, even when wearing glasses/contacts?: No Does the patient have difficulty  concentrating, remembering, or making decisions?: No  Permission Sought/Granted Permission sought to share information with : Case Manager Permission granted to share information with : Yes, Verbal Permission Granted  Share Information with NAME: Case Manager     Permission granted to share info w Relationship: Anthony Graham (mother) [phone]     Emotional Assessment Appearance:: Appears stated age Attitude/Demeanor/Rapport: Gracious Affect (typically observed): Accepting Orientation: : Oriented to Self, Oriented to Place, Oriented to  Time, Oriented to Situation Alcohol / Substance Use: Not Applicable Psych Involvement: No (comment)  Admission diagnosis:  Gastroparesis [K31.84] Dehydration [E86.0] Nausea and vomiting, unspecified vomiting type [R11.2] Patient Active Problem List   Diagnosis Date Noted   DKA, type 1 (HCC) 09/18/2023   Gastroparesis due to DM (HCC) 04/28/2022   Gastroparesis 04/28/2022   Proliferative diabetic retinopathy of left eye with macular edema associated with type 1 diabetes mellitus (HCC) 12/15/2021   AKI (acute kidney injury) (HCC) 12/08/2021   Dehydration 12/08/2021   Leukocytosis 12/08/2021   HTN (hypertension)    Diabetes mellitus without complication (HCC)    Intractable nausea and vomiting 12/07/2021   Normocytic anemia 08/22/2021   DKA (diabetic ketoacidosis) (HCC) 05/27/2021   Microhematuria 02/10/2021   Persistent proteinuria 02/10/2021   Vitamin D deficiency 02/10/2021   Hypermetropia 12/13/2012   PCP:  Pcp, No Pharmacy:   [id number] - [place], [place] - [address] AT  [facility] [address] [place] [address] Phone: [phone] Fax: [phone]  [facility] [place] [facility] [address] [place] [address] Phone: [phone] Fax: [phone]     Social Drivers of Health (SDOH) Social History: SDOH Screenings   Food  Insecurity: No Food Insecurity (03/27/2024)  Housing: Low Risk  (03/27/2024)  Transportation Needs: No Transportation Needs (03/27/2024)  Utilities: Not At Risk (03/27/2024)  Tobacco Use: Low Risk  (03/26/2024)  Recent Concern: Tobacco Use - Medium Risk (02/04/2024)   Received from [facility]   SDOH Interventions: Food Insecurity Interventions: Intervention Not Indicated, Inpatient TOC Housing Interventions: Intervention Not Indicated, Inpatient TOC Transportation Interventions: Intervention Not Indicated, Inpatient TOC Utilities Interventions: Intervention Not Indicated, Inpatient TOC   Readmission Risk Interventions    03/27/2024   10:40 AM 09/21/2023   12:19 PM  Readmission Risk Prevention Plan  Transportation Screening Complete Complete  PCP or Specialist Appt within 3-5 Days Complete Complete  HRI or Home Care Consult Complete Complete  Social Work Consult for Recovery Care Planning/Counseling Complete Complete  Palliative Care Screening Not Applicable Not Applicable  Medication Review Oceanographer) Complete Referral to Pharmacy

## 2024-03-27 NOTE — Plan of Care (Signed)

## 2024-03-27 NOTE — Plan of Care (Signed)
  Problem: Education: Goal: Knowledge of General Education information will improve Description: Including pain rating scale, medication(s)/side effects and non-pharmacologic comfort measures Outcome: Progressing   Problem: Clinical Measurements: Goal: Cardiovascular complication will be avoided Outcome: Progressing   Problem: Activity: Goal: Risk for activity intolerance will decrease Outcome: Progressing   Problem: Coping: Goal: Level of anxiety will decrease Outcome: Progressing   Problem: Pain Managment: Goal: General experience of comfort will improve and/or be controlled Outcome: Progressing

## 2024-03-28 ENCOUNTER — Inpatient Hospital Stay (HOSPITAL_COMMUNITY)

## 2024-03-28 DIAGNOSIS — K3184 Gastroparesis: Secondary | ICD-10-CM | POA: Diagnosis not present

## 2024-03-28 DIAGNOSIS — I10 Essential (primary) hypertension: Secondary | ICD-10-CM | POA: Diagnosis not present

## 2024-03-28 DIAGNOSIS — R008 Other abnormalities of heart beat: Secondary | ICD-10-CM

## 2024-03-28 LAB — CBC WITH DIFFERENTIAL/PLATELET
Abs Immature Granulocytes: 0.07 10*3/uL (ref 0.00–0.07)
Basophils Absolute: 0.1 10*3/uL (ref 0.0–0.1)
Basophils Relative: 0 %
Eosinophils Absolute: 0 10*3/uL (ref 0.0–0.5)
Eosinophils Relative: 0 %
HCT: 35.7 % — ABNORMAL LOW (ref 39.0–52.0)
Hemoglobin: 10.8 g/dL — ABNORMAL LOW (ref 13.0–17.0)
Immature Granulocytes: 1 %
Lymphocytes Relative: 7 %
Lymphs Abs: 1 10*3/uL (ref 0.7–4.0)
MCH: 24.7 pg — ABNORMAL LOW (ref 26.0–34.0)
MCHC: 30.3 g/dL (ref 30.0–36.0)
MCV: 81.5 fL (ref 80.0–100.0)
Monocytes Absolute: 1 10*3/uL (ref 0.1–1.0)
Monocytes Relative: 7 %
Neutro Abs: 11.2 10*3/uL — ABNORMAL HIGH (ref 1.7–7.7)
Neutrophils Relative %: 85 %
Platelets: 180 10*3/uL (ref 150–400)
RBC: 4.38 MIL/uL (ref 4.22–5.81)
RDW: 14 % (ref 11.5–15.5)
WBC: 13.3 10*3/uL — ABNORMAL HIGH (ref 4.0–10.5)
nRBC: 0 % (ref 0.0–0.2)

## 2024-03-28 LAB — ECHOCARDIOGRAM COMPLETE
Area-P 1/2: 6.37 cm2
Est EF: >75
Height: 66 in
S' Lateral: 2.2 cm
Weight: 2105.83 [oz_av]

## 2024-03-28 LAB — COMPREHENSIVE METABOLIC PANEL WITH GFR
ALT: 17 U/L (ref 0–44)
AST: 21 U/L (ref 15–41)
Albumin: 2.9 g/dL — ABNORMAL LOW (ref 3.5–5.0)
Alkaline Phosphatase: 68 U/L (ref 38–126)
Anion gap: 13 (ref 5–15)
BUN: 36 mg/dL — ABNORMAL HIGH (ref 6–20)
CO2: 21 mmol/L — ABNORMAL LOW (ref 22–32)
Calcium: 8.1 mg/dL — ABNORMAL LOW (ref 8.9–10.3)
Chloride: 100 mmol/L (ref 98–111)
Creatinine, Ser: 3.25 mg/dL — ABNORMAL HIGH (ref 0.61–1.24)
GFR, Estimated: 26 mL/min — ABNORMAL LOW (ref >60)
Glucose, Bld: 266 mg/dL — ABNORMAL HIGH (ref 70–99)
Potassium: 4.1 mmol/L (ref 3.5–5.1)
Sodium: 134 mmol/L — ABNORMAL LOW (ref 135–145)
Total Bilirubin: 1.3 mg/dL — ABNORMAL HIGH (ref 0.0–1.2)
Total Protein: 5.9 g/dL — ABNORMAL LOW (ref 6.5–8.1)

## 2024-03-28 LAB — GLUCOSE, CAPILLARY
Glucose-Capillary: 190 mg/dL — ABNORMAL HIGH (ref 70–99)
Glucose-Capillary: 194 mg/dL — ABNORMAL HIGH (ref 70–99)
Glucose-Capillary: 276 mg/dL — ABNORMAL HIGH (ref 70–99)
Glucose-Capillary: 281 mg/dL — ABNORMAL HIGH (ref 70–99)
Glucose-Capillary: 310 mg/dL — ABNORMAL HIGH (ref 70–99)

## 2024-03-28 LAB — MISC LABCORP TEST (SEND OUT): Labcorp test code: 83935

## 2024-03-28 LAB — PHOSPHORUS: Phosphorus: 3.4 mg/dL (ref 2.5–4.6)

## 2024-03-28 LAB — MAGNESIUM: Magnesium: 2 mg/dL (ref 1.7–2.4)

## 2024-03-28 MED ORDER — INSULIN ASPART 100 UNIT/ML IJ SOLN: 0.0000 [IU] | Freq: Every day | INTRAMUSCULAR | Status: DC

## 2024-03-28 MED ORDER — CARVEDILOL 12.5 MG PO TABS: 25.0000 mg | ORAL_TABLET | Freq: Two times a day (BID) | ORAL | 1 refills | Status: DC

## 2024-03-28 MED ORDER — INSULIN GLARGINE-YFGN 100 UNIT/ML ~~LOC~~ SOLN
15.0000 [IU] | Freq: Once | SUBCUTANEOUS | Status: AC
  Administered 2024-03-28: 15 [IU] via SUBCUTANEOUS
  Filled 2024-03-28: qty 0.15

## 2024-03-28 MED ORDER — HYDRALAZINE HCL 50 MG PO TABS: 50.0000 mg | ORAL_TABLET | Freq: Three times a day (TID) | ORAL | 1 refills | Status: DC

## 2024-03-28 MED ORDER — INSULIN ASPART 100 UNIT/ML IJ SOLN
3.0000 [IU] | Freq: Three times a day (TID) | INTRAMUSCULAR | Status: DC
  Administered 2024-03-28: 3 [IU] via SUBCUTANEOUS

## 2024-03-28 MED ORDER — METOCLOPRAMIDE HCL 5 MG PO TABS
10.0000 mg | ORAL_TABLET | Freq: Three times a day (TID) | ORAL | Status: DC
  Administered 2024-03-28: 10 mg via ORAL
  Filled 2024-03-28: qty 2

## 2024-03-28 MED ORDER — METOCLOPRAMIDE HCL 5 MG PO TABS: 5.0000 mg | ORAL_TABLET | Freq: Four times a day (QID) | ORAL | 0 refills | Status: DC | PRN

## 2024-03-28 MED ORDER — INSULIN ASPART 100 UNIT/ML IJ SOLN
0.0000 [IU] | Freq: Three times a day (TID) | INTRAMUSCULAR | Status: DC
  Administered 2024-03-28: 2 [IU] via SUBCUTANEOUS
  Administered 2024-03-28: 5 [IU] via SUBCUTANEOUS
  Administered 2024-03-28: 2 [IU] via SUBCUTANEOUS

## 2024-03-28 MED ORDER — INSULIN GLARGINE-YFGN 100 UNIT/ML ~~LOC~~ SOLN: 10.0000 [IU] | Freq: Every day | SUBCUTANEOUS | Status: DC

## 2024-03-28 NOTE — Discharge Summary (Signed)
 Physician Discharge Summary  Anthony Graham NWG:[id number] DOB: Sep 12, 1996 DOA: 03/26/2024  Anthony Graham: Anthony Graham, No  Admit date: 03/26/2024 Discharge date: 03/28/2024  Time spent: 40 minutes  Recommendations for Outpatient Follow-up:  Follow outpatient CBC/CMP  Echo pending at time of discharge, follow Final renal artery US  read pending at the time of discharge, follow final read Follow plasma metanephrines Follow renin/aldo level Needs completion of secondary hypertension workup Needs nephrology follow up outpatient for hypertension, CKD, proteinuira Follow BG management outpatient, needs endocrine follow up  Consider gastric emptying study  Discharge Diagnoses:  Principal Problem:   Gastroparesis   Discharge Condition: stable  Diet recommendation: heart healthy, diabetic  Filed Weights   03/26/24 0940 03/26/24 2000  Weight: 59 kg 59.7 kg    History of present illness:   28 yo with hx T1DM, CKD, gastroparesis who presented to [facility] with recurrent abdominal pain, nausea, and vomiting that he described as similar to previous episodes of gastroparesis.     He was admitted for suspected gastroparesis flare.  Hospitalization complicated by severe asymptomatic hypertension.  Stable for discharge 4/18.    See below for additional details.  Hospital Course:  Assessment and Plan:  Abdominal Pain  Nausea  Vomiting Gastroparesis Flare  Symptoms typical of prior episodes of gastroparesis (he notes regular cannabis use, possible cannabinoid hyperemesis) CT notable for small hiatal hernia Warrants gastric emptying study outpatient Will continue symptomatic management - he's improved, follow outpatient   T1DM Transitioned to basal bolus regimen as pump expired BG's ok   Systemic Inflammatory Response Syndrome Sepsis ruled out  Tachycardia, tachypnea, and leukocytosis - unclear cause, likely due to above (hypovolemia?) Low suspicion for infectious cause -> CT CAP without source  identified, UA not concerning for UA, blood cultures pending   Anion Gap Metabolic Acidosis  Lactic Acidosis  Resolved, suspect related to lactic acidosis and AKI on CKD   Severe Asymptomatic Hypertension No apparent end organ damage at this point No HA, vision changes.  Main symptoms related to suspect gastroparesis flare above.  Possibly related to discomfort from abdominal discomfort and nausea, will treat symptoms. Continue oral meds as able to tolerate -> titrate  Prn meds for severe hypertension - will d/c gtt for now Needs workup for secondary causes of hypertension: renin/aldo, plasma metanephrines, TSH wnl, renal artery US  without RAS bilaterally Needs follow up outpatient with nephrology for BP management, secondary hypertension workup, and follow up of CKD   AKI on CKD IIIb  Proteinuria Baseline creatinine in 2024 appears to be ~2.4 (was ~4 in 12/2023, but at presentation lower than that, not clear what his most recent true baseline is) Currently downtrending with IVF CT with no hydro Urine with protein, follow UP/C (2.22)   Cannabis Use Encourage cessation    Procedures: Pending echo results  Summary:  Renal:    Right: No evidence of right renal artery stenosis. Normal right         Resisitive Index. RRV flow present. Normal size right kidney.  Left:  No evidence of left renal artery stenosis. Normal left         Resistive Index. LRV flow present. Normal size of left         kidney.  Mesenteric:  Normal Celiac artery and Superior Mesenteric artery findings.    Consultations: none  Discharge Exam: Vitals:   03/28/24 1200 03/28/24 1211  BP: 131/76   Pulse: 93   Resp: 14   Temp:  99.1 F (37.3 C)  SpO2: 97%  No complaints Eager to d/c today  General: No acute distress. Cardiovascular: RRR Lungs: unlabored Abdomen: Soft, nontender, nondistended  Neurological: Alert and oriented 3. Moves all extremities 4 with equal strength. Cranial nerves II  through XII grossly intact. Extremities: No clubbing or cyanosis. No edema.   Discharge Instructions   Discharge Instructions     Call MD for:  difficulty breathing, headache or visual disturbances   Complete by: As directed    Call MD for:  extreme fatigue   Complete by: As directed    Call MD for:  hives   Complete by: As directed    Call MD for:  persistant dizziness or light-headedness   Complete by: As directed    Call MD for:  persistant nausea and vomiting   Complete by: As directed    Call MD for:  redness, tenderness, or signs of infection (pain, swelling, redness, odor or green/yellow discharge around incision site)   Complete by: As directed    Call MD for:  severe uncontrolled pain   Complete by: As directed    Call MD for:  temperature >100.4   Complete by: As directed    Diet - low sodium heart healthy   Complete by: As directed    Discharge instructions   Complete by: As directed    You were seen for Anthony Graham flare of your gastroparesis.  You've improved.  Continue your reglan  scheduled with meals and at bedtime for the next 3-7 days, then use only as needed.    Your insulin  pump is out.  Use your home tresiba and sliding scale novolog  until you get your replacement pump.   Your blood pressures are severely elevated.  We started Anthony Graham workup for "secondary" causes of your high blood pressure.  Your renal artery ultrasound was normal.  Your echo (ultrasound of your heart) results are pending at the time of discharge.  Follow this up with your Anthony Graham outpatient.  Your renin/aldosterone levels are pending.  Follow these up with your kidney doctor or Anthony Graham outpatient.  Your serum metanephrines are pending, follow this up with your Anthony Graham or kidney doctor outpatient.  I've increased your coreg  to 25 mg twice Anthony Graham day and your hydralazine  to 50 mg three times Anthony Graham day.  You need further adjustment in your blood pressure regimen.   Return for new, recurrent, or worsening symptoms.  Please ask  your Anthony Graham to request records from this hospitalization so they know what was done and what the next steps will be.   Increase activity slowly   Complete by: As directed       Allergies as of 03/28/2624       Reactions   Apple Juice Itching   Banana Itching   Gramineae Pollens Other (See Comments)   "Sore throat, runny nose, sneezing"        Medication List     TAKE these medications    amLODipine  10 MG tablet Commonly known as: NORVASC  Take 1 tablet (10 mg total) by mouth daily.   Baqsimi One Pack 3 MG/DOSE Powd Generic drug: Glucagon Place 1 spray into the nose as needed (for low BGL).   carvedilol  12.5 MG tablet Commonly known as: COREG  Take 2 tablets (25 mg total) by mouth 2 (two) times daily with Samary Shatz meal. What changed:  how much to take when to take this reasons to take this   Dexcom G6 Sensor Misc 1 each by Other route as directed. Change every 10 days.   Dexcom  G6 Transmitter Misc 1 each by Other route as directed.   dicyclomine  10 MG capsule Commonly known as: BENTYL  Take 10 mg by mouth as needed for spasms.   ergocalciferol 1.25 MG (50000 UT) capsule Commonly known as: VITAMIN D2 Take 50,000 Units by mouth every Wednesday.   escitalopram  5 MG tablet Commonly known as: LEXAPRO  Take 5 mg by mouth as needed (anxiety).   hydrALAZINE  50 MG tablet Commonly known as: APRESOLINE  Take 1 tablet (50 mg total) by mouth 3 (three) times daily. What changed:  medication strength how much to take when to take this   insulin  aspart 100 UNIT/ML FlexPen Commonly known as: NOVOLOG  Inject 120 Units into the skin every 3 (three) days. Via insulin  pump - 120 units every three days.   insulin  degludec 100 UNIT/ML FlexTouch Pen Commonly known as: TRESIBA Inject 26 Units into the skin daily. Use if insulin  pump malfunctions.   Medical Compression Stockings Misc Dispense 1 pair of knee-high compression stockings, to be used when you are continuously standing    metoCLOPramide  5 MG tablet Commonly known as: Reglan  Take 1 tablet (5 mg total) by mouth every 6 (six) hours as needed for nausea or vomiting.   Omnipod 5 DexG7G6 Pods Gen 5 Misc Inject 1 Device into the skin every 3 (three) days.   ondansetron  4 MG disintegrating tablet Commonly known as: ZOFRAN -ODT Take 1 tablet (4 mg total) by mouth every 8 (eight) hours as needed for nausea or vomiting. What changed: when to take this   pantoprazole  40 MG tablet Commonly known as: PROTONIX  Take 1 tablet (40 mg total) by mouth daily. What changed:  when to take this reasons to take this   torsemide 10 MG tablet Commonly known as: DEMADEX Take 10 mg by mouth as needed (swelling).       Allergies  Allergen Reactions   Apple Juice Itching   Banana Itching   Gramineae Pollens Other (See Comments)    "Sore throat, runny nose, sneezing"      The results of significant diagnostics from this hospitalization (including imaging, microbiology, ancillary and laboratory) are listed below for reference.    Significant Diagnostic Studies: VAS US  RENAL ARTERY DUPLEX Result Date: 03/28/2024 ABDOMINAL VISCERAL Patient Name:  Anthony Graham  Date of Exam:   03/28/2024 Medical Rec #: [id number]        Accession #:    [id number] Date of Birth: [birth year]        Patient Gender: M Patient Age:   87 years Exam Location:  [facility] Procedure:      VAS US  RENAL ARTERY DUPLEX Referring Phys: [id number] Azariyah Luhrs CALDWELL POWELL JR -------------------------------------------------------------------------------- Indications: Hypertension High Risk Factors: Diabetes. Limitations: Air/bowel gas, severe patient discomfort and rib shadowing. Comparison Study: No previous exams Performing Technologist: Jody Hill RVT, RDMS  Examination Guidelines: Flavio Lindroth complete evaluation includes B-mode imaging, spectral Doppler, color Doppler, and power Doppler as needed of all accessible portions of each vessel. Bilateral testing is  considered an integral part of [employer] complete examination. Limited examinations for reoccurring indications may be performed as noted.  Duplex Findings: +--------------------+--------+--------+------+--------+ Mesenteric          PSV cm/sEDV cm/sPlaqueComments +--------------------+--------+--------+------+--------+ Aorta Prox            114      18                  +--------------------+--------+--------+------+--------+ Celiac Artery Origin  107      31                  +--------------------+--------+--------+------+--------+  SMA Proximal          169      23                  +--------------------+--------+--------+------+--------+    +------------------+--------+--------+-------+ Right Renal ArteryPSV cm/sEDV cm/sComment +------------------+--------+--------+-------+ Origin               66      22           +------------------+--------+--------+-------+ Proximal             68      23           +------------------+--------+--------+-------+ Mid                  59      21           +------------------+--------+--------+-------+ Distal               55      15           +------------------+--------+--------+-------+ +-----------------+--------+--------+-------+ Left Renal ArteryPSV cm/sEDV cm/sComment +-----------------+--------+--------+-------+ Origin              74      19           +-----------------+--------+--------+-------+ Proximal            61      19           +-----------------+--------+--------+-------+ Mid                 50      22           +-----------------+--------+--------+-------+ Distal              41      17           +-----------------+--------+--------+-------+ +------------+--------+--------+----+-----------+--------+--------+----+ Right KidneyPSV cm/sEDV cm/sRI  Left KidneyPSV cm/sEDV cm/sRI   +------------+--------+--------+----+-----------+--------+--------+----+ Upper Pole  22      9       0.59Upper  Pole 21      9       0.55 +------------+--------+--------+----+-----------+--------+--------+----+ Mid         21      8       0.        24      10      0.61 +------------+--------+--------+----+-----------+--------+--------+----+ Lower Pole  28      9       0.67Lower Pole 26      8       0.68 +------------+--------+--------+----+-----------+--------+--------+----+ Hilar       47      18      0.62Hilar      44      18      0.63 +------------+--------+--------+----+-----------+--------+--------+----+ +------------------+-----------------+------------------+-----------------+ Right Kidney                       Left Kidney                         +------------------+-----------------+------------------+-----------------+ RAR                                RAR                                 +------------------+-----------------+------------------+-----------------+ RAR (manual)  RAR (manual)                        +------------------+-----------------+------------------+-----------------+ Cortex            18/8 cm/s RI 0.58Cortex            20/9 cm/s RI 0.54 +------------------+-----------------+------------------+-----------------+ Cortex thickness                   Corex thickness                     +------------------+-----------------+------------------+-----------------+ Kidney length (cm)10.08            Kidney length (cm)10.70             +------------------+-----------------+------------------+-----------------+   Summary: Renal:  Right: No evidence of right renal artery stenosis. Normal right        Resisitive Index. RRV flow present. Normal size right kidney. Left:  No evidence of left renal artery stenosis. Normal left        Resistive Index. LRV flow present. Normal size of left        kidney. Mesenteric: Normal Celiac artery and Superior Mesenteric artery findings.  *See table(s) above for measurements and observations.      Preliminary    CT CHEST ABDOMEN PELVIS WO CONTRAST Result Date: 03/27/2024 CLINICAL DATA:  Sepsis, abdominal pain, nausea, vomiting, leukocytosis EXAM: CT CHEST, ABDOMEN AND PELVIS WITHOUT CONTRAST TECHNIQUE: Multidetector CT imaging of the chest, abdomen and pelvis was performed following the standard protocol without IV contrast. RADIATION DOSE REDUCTION: This exam was performed according to the departmental dose-optimization program which includes automated exposure control, adjustment of the mA and/or kV according to patient size and/or use of iterative reconstruction technique. COMPARISON:  09/18/2023 FINDINGS: CT CHEST FINDINGS Cardiovascular: No significant vascular findings. Normal heart size. No pericardial effusion. Hypoattenuation of the cardiac blood pool in keeping with at least mild anemia. Mediastinum/Nodes: No enlarged mediastinal, hilar, or axillary lymph nodes. Thyroid gland, trachea, and esophagus demonstrate no significant findings. Small hiatal hernia Lungs/Pleura: Lungs are clear. No pleural effusion or pneumothorax. Musculoskeletal: No chest wall mass or suspicious bone lesions identified. CT ABDOMEN PELVIS FINDINGS Hepatobiliary: No focal liver abnormality is seen. Previously noted hepatic steatosis has resolved. No gallstones, gallbladder wall thickening, or biliary dilatation. Pancreas: Unremarkable Spleen: Unremarkable Adrenals/Urinary Tract: The adrenal glands are unremarkable. The kidneys are normal in size and position. Note is made of Aadhya Bustamante left extrarenal pelvis. No hydronephrosis. No intrarenal or ureteral calculi. Mild bilateral perinephric stranding is stable, nonspecific. The bladder is unremarkable. Stomach/Bowel: Stomach is within normal limits. Appendix appears normal. No evidence of bowel wall thickening, distention, or inflammatory changes. Vascular/Lymphatic: No significant vascular findings are present. No enlarged abdominal or pelvic lymph nodes. Reproductive: Prostate  is unremarkable. Other: No abdominal wall hernia or abnormality. No abdominopelvic ascites. Musculoskeletal: No acute or significant osseous findings. IMPRESSION: 1. Hypoattenuation of the cardiac blood pool in keeping with at least mild anemia. 2. Small hiatal hernia. Electronically Signed   By: Worthy Heads M.D.   On: 03/27/2024 01:34    Microbiology: Recent Results (from the past 240 hours)  Culture, blood (Routine X 2) w Reflex to ID Panel     Status: None (Preliminary result)   Collection Time: 03/26/24  7:18 PM   Specimen: BLOOD RIGHT ARM  Result Value Ref Range Status   Specimen Description   Final    BLOOD RIGHT ARM Performed at [facility],  [address], [place], [place] [address]    Special Requests   Final    BOTTLES DRAWN AEROBIC AND ANAEROBIC Blood Culture results may not be optimal due to an inadequate volume of blood received in culture bottles Performed at [facility], [address] [place], [place] [address]    Culture   Final    NO GROWTH 2 DAYS Performed at [facility], [address] [place], [place] [address]    Report Status PENDING  Incomplete  Culture, blood (Routine X 2) w Reflex to ID Panel     Status: None (Preliminary result)   Collection Time: 03/26/24  7:23 PM   Specimen: BLOOD RIGHT ARM  Result Value Ref Range Status   Specimen Description   Final    BLOOD RIGHT ARM Performed at [facility], [address], [place], [place] [address]    Special Requests   Final    BOTTLES DRAWN AEROBIC ONLY Blood Culture results may not be optimal due to an inadequate volume of blood received in culture bottles Performed at [facility], [address] [place], [place] [address]    Culture   Final    NO GROWTH 2 DAYS Performed at [facility], [address] [place], [place] [address]    Report Status PENDING  Incomplete  MRSA Next Gen by PCR, Nasal     Status: None   Collection Time:  03/27/24  3:39 AM   Specimen: Nasal Mucosa; Nasal Swab  Result Value Ref Range Status   MRSA by PCR Next Gen NOT DETECTED NOT DETECTED Final    Comment: (NOTE) The GeneXpert MRSA Assay (FDA approved for NASAL specimens only), is one component of Brooklen Runquist comprehensive MRSA colonization surveillance program. It is not intended to diagnose MRSA infection nor to guide or monitor treatment for MRSA infections. Test performance is not FDA approved in patients less than 7 years old. Performed at [facility], [address], [place], [place] [address]      Labs: Basic Metabolic Panel: Recent Labs  Lab 03/26/24 0941 03/26/24 1032 03/26/24 1641 03/27/24 0259 03/28/24 0305  NA 145 144 141 139 134*  K 4.1 4.6 3.7 4.0 4.1  CL 102  --  103 104 100  CO2 23  --  25 23 21*  GLUCOSE 175*  --  177* 179* 266*  BUN 34*  --  35* 34* 36*  CREATININE 3.53*  --  3.34* 3.18* 3.25*  CALCIUM 9.9  --  9.0 8.4* 8.1*  MG  --   --   --   --  2.0  PHOS  --   --   --   --  3.4   Liver Function Tests: Recent Labs  Lab 03/26/24 0941 03/28/24 0305  AST 38 21  ALT 24 17  ALKPHOS 108 68  BILITOT 1.1 1.3*  PROT 8.8* 5.9*  ALBUMIN 4.4 2.9*   Recent Labs  Lab 03/26/24 0941  LIPASE 27   No results for input(s): "AMMONIA" in the last 168 hours. CBC: Recent Labs  Lab 03/26/24 0941 03/26/24 1032 03/27/24 0259 03/28/24 0305  WBC 12.3*  --  16.7* 13.3*  NEUTROABS  --   --   --  11.2*  HGB 12.5* 12.9* 10.8* 10.8*  HCT 37.9* 38.0* 34.1* 35.7*  MCV 76.3*  --  78.6* 81.5  PLT 264  --  217 180   Cardiac Enzymes: No results for input(s): "  CKTOTAL", "CKMB", "CKMBINDEX", "TROPONINI" in the last 168 hours. BNP: BNP (last 3 results) No results for input(s): "BNP" in the last 8760 hours.  ProBNP (last 3 results) No results for input(s): "PROBNP" in the last 8760 hours.  CBG: Recent Labs  Lab 03/27/24 2149 03/28/24 0000 03/28/24 0307 03/28/24 0717 03/28/24 1209  GLUCAP 233*  310* 276* 190* 281*       Signed:  Donnetta Gains MD.  [facility] 03/28/2024, 3:13 PM

## 2024-03-28 NOTE — Progress Notes (Signed)
 Patient informed RN that he is not currently using his insulin  pump due to the pump had expired.   Patient stated his mother was to bring him a new insulin  pump but has not yet done so.   Patient is aware to notify RN if/when he has a new insulin  pump prior to self administering insulin  via his pump.   Messaged MD Ada Acres to inform and request insulin  orders.

## 2024-03-28 NOTE — Plan of Care (Signed)
  Problem: Education: Goal: Knowledge of General Education information will improve Description: Including pain rating scale, medication(s)/side effects and non-pharmacologic comfort measures Outcome: Progressing   Problem: Clinical Measurements: [place]

## 2024-03-28 NOTE — Inpatient Diabetes Management (Signed)
 Inpatient Diabetes Program Recommendations  AACE/ADA: New Consensus Statement on Inpatient Glycemic Control (2015)  Target Ranges:  Prepandial:   less than 140 mg/dL      Peak postprandial:   less than 180 mg/dL (1-2 hours)      Critically ill patients:  140 - 180 mg/dL   Lab Results  Component Value Date   GLUCAP 281 (H) 03/28/2024   HGBA1C 9.9 (H) 01/10/2024    Review of Glycemic Control  Diabetes history: DM1 Outpatient Diabetes medications: Insulin  pump or Tresiba 26 units daily, Novolog  s/s Current orders for Inpatient glycemic control: Semglee  15 units daily x 1 dose, Semglee  10 units daily on 4/19 x 1 dose  HgbA1C - 9.9%  Inpatient Diabetes Program Recommendations:    Pt ran out of pump supplies and will have them on Monday, April 21st. When pump is off, pt states he takes Tresiba 26 units daily and Novolog  s/s. Sees Warren Batty for T1DM. Has Dexcom CGM. Discussed glucose and A1C goals. Discussed importance of checking CBGs and maintaining good CBG control to prevent long-term and short-term complications. Explained how hyperglycemia leads to damage within blood vessels which lead to the common complications seen with uncontrolled diabetes. Stressed to the patient the importance of improving glycemic control to prevent further complications from uncontrolled diabetes. Discussed impact of nutrition, exercise, stress, sickness, and medications on diabetes control. Pt states he has hypoglycemia once in awhile. Discussed s/s and treatment. Instructed pt to wait 24H after last dose of basal insulin  prior to placing pump back on. Pt voices understanding. Will use Tresiba and Novolog  while pump remains off until Mon. Pt had no questions/concerns.   Thank you. Shona Brandy, RD, LDN, CDCES Inpatient Diabetes Coordinator [phone]

## 2024-03-28 NOTE — Progress Notes (Signed)
  Echocardiogram 2D Echocardiogram has been performed.  Fain Home RDCS 03/28/2024, 12:09 PM

## 2024-03-28 NOTE — Progress Notes (Signed)
 Renal artery duplex has been completed.   Results can be found under chart review under CV PROC. 03/28/2024 4:28 PM Kamrie Fanton RVT, RDMS

## 2024-03-28 NOTE — Progress Notes (Signed)
 PROGRESS NOTE    Anthony Graham  NWG:[id number] DOB: Nov 03, 1996 DOA: 03/26/2024 PCP: Pcp, No  Chief Complaint  Patient presents with   Emesis   Abdominal Pain    Brief Narrative:   28 yo with hx T1DM, CKD, gastroparesis who presented to [facility] with recurrent abdominal pain, nausea, and vomiting that he described as similar to previous episodes of gastroparesis.    He was admitted for suspected gastroparesis flare.    Assessment & Plan:   Principal Problem:   Gastroparesis  Abdominal Pain  Nausea  Vomiting Gastroparesis Flare  Symptoms typical of prior episodes of gastroparesis (he notes regular cannabis use, possible cannabinoid hyperemesis) CT notable for small hiatal hernia Warrants gastric emptying study outpatient Will continue symptomatic management - scheduled reglan  (transition to PO).  Prn antiemetics.  RD for education regarding gastroparesis diet.  T1DM Transitioned to basal bolus regimen as pump expired BG's ok  Systemic Inflammatory Response Syndrome Sepsis ruled out  Tachycardia, tachypnea, and leukocytosis - unclear cause, likely due to above (hypovolemia?) Low suspicion for infectious cause -> CT CAP without source identified, UA not concerning for UA, blood cultures pending  Anion Gap Metabolic Acidosis  Lactic Acidosis  Resolved, suspect related to lactic acidosis and AKI on CKD  Severe Asymptomatic Hypertension No apparent end organ damage at this point No HA, vision changes.  Main symptoms related to suspect gastroparesis flare above.  Possibly related to discomfort from abdominal discomfort and nausea, will treat symptoms. Continue oral meds as able to tolerate -> titrate  Prn meds for severe hypertension - will d/c gtt for now Needs workup for secondary causes of hypertension: renin/aldo, urine metanephrines/catecholamines, TSH, UP/C, renal artery US  Needs follow up outpatient with nephrology for BP management, secondary hypertension workup, and  follow up of CKD  AKI on CKD IIIb  Proteinuria Baseline creatinine in 2024 appears to be ~2.4 (was ~4 in 12/2023, but at presentation lower than that, not clear what his most recent true baseline is) Currently downtrending with IVF CT with no hydro Urine with protein, follow UP/C (2.22)  Cannabis Use Encourage cessation    DVT prophylaxis: lovenox  Code Status: full Family Communication: none Disposition:   Status is: Inpatient Remains inpatient appropriate because: continued symptoms   Consultants:  none  Procedures:  none  Antimicrobials:  Anti-infectives (From admission, onward)    Start     Dose/Rate Route Frequency Ordered Stop   03/28/24 1700  vancomycin  (VANCOCIN ) IVPB 1000 mg/200 mL premix  Status:  Discontinued        1,000 mg 200 mL/hr over 60 Minutes Intravenous Every 48 hours 03/26/24 1753 03/27/24 0715   03/27/24 1700  ceFEPIme  (MAXIPIME ) 2 g in sodium chloride  0.9 % 100 mL IVPB  Status:  Discontinued        2 g 200 mL/hr over 30 Minutes Intravenous Every 24 hours 03/26/24 1753 03/27/24 0715   03/26/24 1745  ceFEPIme  (MAXIPIME ) 2 g in sodium chloride  0.9 % 100 mL IVPB        2 g 200 mL/hr over 30 Minutes Intravenous  Once 03/26/24 1650 03/26/24 1746   03/26/24 1745  vancomycin  (VANCOCIN ) IVPB 1000 mg/200 mL premix        1,000 mg 200 mL/hr over 60 Minutes Intravenous  Once 03/26/24 1652 03/26/24 1836       Subjective: Feels well Gastroparesis sx improved  Objective: Vitals:   03/28/24 0600 03/28/24 0630 03/28/24 0700 03/28/24 0718  BP: (!) 160/98 (!) 158/99 (!) 158/98  Pulse: (!) 102 (!) 107 100   Resp: (!) 25 15 12    Temp:    98.5 F (36.9 C)  TempSrc:    Oral  SpO2: 97% 95% 96%   Weight:      Height:        Intake/Output Summary (Last 24 hours) at 03/28/2024 0759 Last data filed at 03/28/2024 0708 Gross per 24 hour  Intake 1565.51 ml  Output 800 ml  Net 765.51 ml   Filed Weights   03/26/24 0940 03/26/24 2000  Weight: 59 kg 59.7 kg     Examination:  General: No acute distress. Cardiovascular: regular, mildly tachy Lungs: unlabored Abdomen: Soft, nontender, nondistended . Neurological: Alert and oriented 3. Moves all extremities 4 with equal strength. Cranial nerves II through XII grossly intact. Extremities: No clubbing or cyanosis. No edema.   Data Reviewed: I have personally reviewed following labs and imaging studies  CBC: Recent Labs  Lab 03/26/24 0941 03/26/24 1032 03/27/24 0259 03/28/24 0305  WBC 12.3*  --  16.7* 13.3*  NEUTROABS  --   --   --  11.2*  HGB 12.5* 12.9* 10.8* 10.8*  HCT 37.9* 38.0* 34.1* 35.7*  MCV 76.3*  --  78.6* 81.5  PLT 264  --  217 180    Basic Metabolic Panel: Recent Labs  Lab 03/26/24 0941 03/26/24 1032 03/26/24 1641 03/27/24 0259 03/28/24 0305  NA 145 144 141 139 134*  K 4.1 4.6 3.7 4.0 4.1  CL 102  --  103 104 100  CO2 23  --  25 23 21*  GLUCOSE 175*  --  177* 179* 266*  BUN 34*  --  35* 34* 36*  CREATININE 3.53*  --  3.34* 3.18* 3.25*  CALCIUM 9.9  --  9.0 8.4* 8.1*  MG  --   --   --   --  2.0  PHOS  --   --   --   --  3.4    GFR: Estimated Creatinine Clearance: 29.1 mL/min (Rykar Lebleu) (by C-G formula based on SCr of 3.25 mg/dL (H)).  Liver Function Tests: Recent Labs  Lab 03/26/24 0941 03/28/24 0305  AST 38 21  ALT 24 17  ALKPHOS 108 68  BILITOT 1.1 1.3*  PROT 8.8* 5.9*  ALBUMIN 4.4 2.9*    CBG: Recent Labs  Lab 03/27/24 1936 03/27/24 2149 03/28/24 0000 03/28/24 0307 03/28/24 0717  GLUCAP 209* 233* 310* 276* 190*     Recent Results (from the past 240 hours)  Culture, blood (Routine X 2) w Reflex to ID Panel     Status: None (Preliminary result)   Collection Time: 03/26/24  7:18 PM   Specimen: BLOOD RIGHT ARM  Result Value Ref Range Status   Specimen Description   Final    BLOOD RIGHT ARM Performed at [facility], [address], [place], [place] [address]    Special Requests   Final    BOTTLES DRAWN AEROBIC AND ANAEROBIC Blood  Culture results may not be optimal due to an inadequate volume of blood received in culture bottles Performed at [facility], [address] [place], [place] [address]    Culture   Final    NO GROWTH < 12 HOURS Performed at [facility], [address], [place], [place] [address]    Report Status PENDING  Incomplete  Culture, blood (Routine X 2) w Reflex to ID Panel     Status: None (Preliminary result)   Collection Time:  03/26/24  7:23 PM   Specimen: BLOOD RIGHT ARM  Result Value Ref Range Status   Specimen Description   Final    BLOOD RIGHT ARM Performed at [facility], [address] [place], [place] [address]    Special Requests   Final    BOTTLES DRAWN AEROBIC ONLY Blood Culture results may not be optimal due to an inadequate volume of blood received in culture bottles Performed at [facility], [address] [place], [place] [address]    Culture   Final    NO GROWTH < 12 HOURS Performed at [facility], [address], [place], [place] [address]    Report Status PENDING  Incomplete  MRSA Next Gen by PCR, Nasal     Status: None   Collection Time: 03/27/24  3:39 AM   Specimen: Nasal Mucosa; Nasal Swab  Result Value Ref Range Status   MRSA by PCR Next Gen NOT DETECTED NOT DETECTED Final    Comment: (NOTE) The GeneXpert MRSA Assay (FDA approved for NASAL specimens only), is one component of Regina Ganci comprehensive MRSA colonization surveillance program. It is not intended to diagnose MRSA infection nor to guide or monitor treatment for MRSA infections. Test performance is not FDA approved in patients less than 29 years old. Performed at [facility], [address] [place], [place] [address]          Radiology Studies: CT CHEST ABDOMEN PELVIS WO CONTRAST Result Date: 03/27/2024 CLINICAL DATA:  Sepsis, abdominal pain, nausea, vomiting, leukocytosis EXAM: CT CHEST, ABDOMEN AND PELVIS WITHOUT  CONTRAST TECHNIQUE: Multidetector CT imaging of the chest, abdomen and pelvis was performed following the standard protocol without IV contrast. RADIATION DOSE REDUCTION: This exam was performed according to the departmental dose-optimization program which includes automated exposure control, adjustment of the mA and/or kV according to patient size and/or use of iterative reconstruction technique. COMPARISON:  09/18/2023 FINDINGS: CT CHEST FINDINGS Cardiovascular: No significant vascular findings. Normal heart size. No pericardial effusion. Hypoattenuation of the cardiac blood pool in keeping with at least mild anemia. Mediastinum/Nodes: No enlarged mediastinal, hilar, or axillary lymph nodes. Thyroid gland, trachea, and esophagus demonstrate no significant findings. Small hiatal hernia Lungs/Pleura: Lungs are clear. No pleural effusion or pneumothorax. Musculoskeletal: No chest wall mass or suspicious bone lesions identified. CT ABDOMEN PELVIS FINDINGS Hepatobiliary: No focal liver abnormality is seen. Previously noted hepatic steatosis has resolved. No gallstones, gallbladder wall thickening, or biliary dilatation. Pancreas: Unremarkable Spleen: Unremarkable Adrenals/Urinary Tract: The adrenal glands are unremarkable. The kidneys are normal in size and position. Note is made of Olaf Mesa left extrarenal pelvis. No hydronephrosis. No intrarenal or ureteral calculi. Mild bilateral perinephric stranding is stable, nonspecific. The bladder is unremarkable. Stomach/Bowel: Stomach is within normal limits. Appendix appears normal. No evidence of bowel wall thickening, distention, or inflammatory changes. Vascular/Lymphatic: No significant vascular findings are present. No enlarged abdominal or pelvic lymph nodes. Reproductive: Prostate is unremarkable. Other: No abdominal wall hernia or abnormality. No abdominopelvic ascites. Musculoskeletal: No acute or significant osseous findings. IMPRESSION: 1. Hypoattenuation of the cardiac  blood pool in keeping with at least mild anemia. 2. Small hiatal hernia. Electronically Signed   By: Worthy Heads M.D.   On: 03/27/2024 01:34        Scheduled Meds:  amLODipine   10 mg Oral Daily   capsaicin    Topical BID   carvedilol   12.5 mg Oral Once   carvedilol   25 mg Oral BID WC  Chlorhexidine  Gluconate Cloth  6 each Topical Daily   enoxaparin  (LOVENOX ) injection  30 mg Subcutaneous Q24H   escitalopram   5 mg Oral QHS   hydrALAZINE   25 mg Oral Once   hydrALAZINE   50 mg Oral TID   insulin  aspart  0-5 Units Subcutaneous QHS   insulin  aspart  0-9 Units Subcutaneous TID WC   insulin  aspart  3 Units Subcutaneous TID WC   [START ON 03/29/2024] insulin  glargine-yfgn  10 Units Subcutaneous Daily   metoCLOPramide  (REGLAN ) injection  10 mg Intravenous Q6H   Continuous Infusions:  lactated ringers  75 mL/hr at 03/28/24 0708   promethazine  (PHENERGAN ) injection (IM or IVPB)       LOS: 2 days    Time spent: over 30 min    Donnetta Gains, MD [facility]   To contact the attending provider between 7A-7P or the covering provider during after hours 7P-7A, please log into the web site [email or web address] and access using universal [place] password for that web site. If you do not have the password, please call the hospital operator.  03/28/2024, 7:59 AM

## 2024-03-31 LAB — CULTURE, BLOOD (ROUTINE X 2)
Culture: NO GROWTH
Culture: NO GROWTH

## 2024-04-03 LAB — METANEPHRINES, PLASMA
Metanephrine, Free: <25 pg/mL (ref 0.0–88.0)
Normetanephrine, Free: 138.4 pg/mL (ref 0.0–210.1)

## 2024-04-07 LAB — ALDOSTERONE + RENIN ACTIVITY W/ RATIO
ALDO / PRA Ratio: <1.5 (ref 0.0–30.0)
Aldosterone: <1 ng/dL (ref 0.0–30.0)
PRA LC/MS/MS: 0.67 ng/mL/h (ref 0.167–5.380)

## 2024-06-21 ENCOUNTER — Inpatient Hospital Stay (HOSPITAL_BASED_OUTPATIENT_CLINIC_OR_DEPARTMENT_OTHER)
Admission: EM | Admit: 2024-06-21 | Discharge: 2024-06-24 | DRG: 074 | Disposition: A | Discharge status: Home / Self Care | Attending: Internal Medicine | Admitting: Internal Medicine

## 2024-06-21 ENCOUNTER — Other Ambulatory Visit: Payer: Self-pay

## 2024-06-21 ENCOUNTER — Encounter (HOSPITAL_BASED_OUTPATIENT_CLINIC_OR_DEPARTMENT_OTHER): Payer: Self-pay | Admitting: Emergency Medicine

## 2024-06-21 DIAGNOSIS — Z9641 Presence of insulin pump (external) (internal): Secondary | ICD-10-CM | POA: Diagnosis present

## 2024-06-21 DIAGNOSIS — E1043 Type 1 diabetes mellitus with diabetic autonomic (poly)neuropathy: Secondary | ICD-10-CM | POA: Diagnosis not present

## 2024-06-21 DIAGNOSIS — E1143 Type 2 diabetes mellitus with diabetic autonomic (poly)neuropathy: Secondary | ICD-10-CM | POA: Diagnosis present

## 2024-06-21 DIAGNOSIS — D72829 Elevated white blood cell count, unspecified: Secondary | ICD-10-CM | POA: Diagnosis present

## 2024-06-21 DIAGNOSIS — R000 Tachycardia, unspecified: Secondary | ICD-10-CM | POA: Diagnosis present

## 2024-06-21 DIAGNOSIS — I129 Hypertensive chronic kidney disease with stage 1 through stage 4 chronic kidney disease, or unspecified chronic kidney disease: Secondary | ICD-10-CM | POA: Diagnosis present

## 2024-06-21 DIAGNOSIS — E1065 Type 1 diabetes mellitus with hyperglycemia: Secondary | ICD-10-CM | POA: Diagnosis not present

## 2024-06-21 DIAGNOSIS — R739 Hyperglycemia, unspecified: Secondary | ICD-10-CM | POA: Diagnosis present

## 2024-06-21 DIAGNOSIS — E119 Type 2 diabetes mellitus without complications: Secondary | ICD-10-CM

## 2024-06-21 DIAGNOSIS — N184 Chronic kidney disease, stage 4 (severe): Secondary | ICD-10-CM | POA: Diagnosis present

## 2024-06-21 DIAGNOSIS — Z91148 Patient's other noncompliance with medication regimen for other reason: Secondary | ICD-10-CM

## 2024-06-21 DIAGNOSIS — E1010 Type 1 diabetes mellitus with ketoacidosis without coma: Principal | ICD-10-CM | POA: Diagnosis present

## 2024-06-21 DIAGNOSIS — Z794 Long term (current) use of insulin: Secondary | ICD-10-CM

## 2024-06-21 DIAGNOSIS — Z79899 Other long term (current) drug therapy: Secondary | ICD-10-CM

## 2024-06-21 DIAGNOSIS — Z833 Family history of diabetes mellitus: Secondary | ICD-10-CM

## 2024-06-21 DIAGNOSIS — Z91018 Allergy to other foods: Secondary | ICD-10-CM

## 2024-06-21 DIAGNOSIS — T383X6A Underdosing of insulin and oral hypoglycemic [antidiabetic] drugs, initial encounter: Secondary | ICD-10-CM | POA: Diagnosis present

## 2024-06-21 DIAGNOSIS — K3184 Gastroparesis: Secondary | ICD-10-CM | POA: Diagnosis present

## 2024-06-21 DIAGNOSIS — E876 Hypokalemia: Secondary | ICD-10-CM | POA: Diagnosis present

## 2024-06-21 DIAGNOSIS — E10319 Type 1 diabetes mellitus with unspecified diabetic retinopathy without macular edema: Secondary | ICD-10-CM | POA: Diagnosis present

## 2024-06-21 DIAGNOSIS — I160 Hypertensive urgency: Secondary | ICD-10-CM | POA: Diagnosis present

## 2024-06-21 DIAGNOSIS — E1022 Type 1 diabetes mellitus with diabetic chronic kidney disease: Secondary | ICD-10-CM | POA: Diagnosis present

## 2024-06-21 LAB — CBC WITH DIFFERENTIAL/PLATELET
Abs Immature Granulocytes: 0.06 K/uL (ref 0.00–0.07)
Basophils Absolute: 0 K/uL (ref 0.0–0.1)
Basophils Relative: 0 %
Eosinophils Absolute: 0 K/uL (ref 0.0–0.5)
Eosinophils Relative: 0 %
HCT: 38.8 % — ABNORMAL LOW (ref 39.0–52.0)
Hemoglobin: 12.7 g/dL — ABNORMAL LOW (ref 13.0–17.0)
Immature Granulocytes: 0 %
Lymphocytes Relative: 6 %
Lymphs Abs: 0.9 K/uL (ref 0.7–4.0)
MCH: 24.5 pg — ABNORMAL LOW (ref 26.0–34.0)
MCHC: 32.7 g/dL (ref 30.0–36.0)
MCV: 74.8 fL — ABNORMAL LOW (ref 80.0–100.0)
Monocytes Absolute: 0.4 K/uL (ref 0.1–1.0)
Monocytes Relative: 3 %
Neutro Abs: 12.4 K/uL — ABNORMAL HIGH (ref 1.7–7.7)
Neutrophils Relative %: 91 %
Platelets: 251 K/uL (ref 150–400)
RBC: 5.19 MIL/uL (ref 4.22–5.81)
RDW: 14.5 % (ref 11.5–15.5)
WBC: 13.8 K/uL — ABNORMAL HIGH (ref 4.0–10.5)
nRBC: 0 % (ref 0.0–0.2)

## 2024-06-21 LAB — COMPREHENSIVE METABOLIC PANEL WITH GFR
ALT: 64 U/L — ABNORMAL HIGH (ref 0–44)
AST: 36 U/L (ref 15–41)
Albumin: 4.6 g/dL (ref 3.5–5.0)
Alkaline Phosphatase: 138 U/L — ABNORMAL HIGH (ref 38–126)
Anion gap: 28 — ABNORMAL HIGH (ref 5–15)
BUN: 28 mg/dL — ABNORMAL HIGH (ref 6–20)
CO2: 18 mmol/L — ABNORMAL LOW (ref 22–32)
Calcium: 10 mg/dL (ref 8.9–10.3)
Chloride: 98 mmol/L (ref 98–111)
Creatinine, Ser: 3.03 mg/dL — ABNORMAL HIGH (ref 0.61–1.24)
GFR, Estimated: 28 mL/min — ABNORMAL LOW (ref >60)
Glucose, Bld: 436 mg/dL — ABNORMAL HIGH (ref 70–99)
Potassium: 4.4 mmol/L (ref 3.5–5.1)
Sodium: 144 mmol/L (ref 135–145)
Total Bilirubin: 0.7 mg/dL (ref 0.0–1.2)
Total Protein: 8.1 g/dL (ref 6.5–8.1)

## 2024-06-21 LAB — CBG MONITORING, ED: Glucose-Capillary: 450 mg/dL — ABNORMAL HIGH (ref 70–99)

## 2024-06-21 MED ORDER — MORPHINE SULFATE (PF) 4 MG/ML IV SOLN
4.0000 mg | Freq: Once | INTRAVENOUS | Status: AC
  Administered 2024-06-22: 4 mg via INTRAVENOUS
  Filled 2024-06-21: qty 1

## 2024-06-21 MED ORDER — INSULIN REGULAR(HUMAN) IN NACL 100-0.9 UT/100ML-% IV SOLN
INTRAVENOUS | Status: DC
  Administered 2024-06-22: 5.5 [IU]/h via INTRAVENOUS
  Filled 2024-06-21: qty 100

## 2024-06-21 MED ORDER — ONDANSETRON HCL 4 MG/2ML IJ SOLN
4.0000 mg | Freq: Once | INTRAMUSCULAR | Status: AC
  Administered 2024-06-22: 4 mg via INTRAVENOUS
  Filled 2024-06-21: qty 2

## 2024-06-21 MED ORDER — LACTATED RINGERS IV SOLN: INTRAVENOUS | Status: DC

## 2024-06-21 MED ORDER — METOCLOPRAMIDE HCL 5 MG/ML IJ SOLN
10.0000 mg | Freq: Once | INTRAMUSCULAR | Status: AC
  Administered 2024-06-22: 10 mg via INTRAVENOUS
  Filled 2024-06-21: qty 2

## 2024-06-21 MED ORDER — DEXTROSE IN LACTATED RINGERS 5 % IV SOLN: INTRAVENOUS | Status: DC

## 2024-06-21 MED ORDER — LACTATED RINGERS IV BOLUS
1000.0000 mL | Freq: Once | INTRAVENOUS | Status: AC
  Administered 2024-06-22: 1000 mL via INTRAVENOUS

## 2024-06-21 MED ORDER — DEXTROSE 50 % IV SOLN: 0.0000 mL | INTRAVENOUS | Status: DC | PRN

## 2024-06-21 NOTE — ED Provider Notes (Signed)
 [place] [facility] Provider Note   CSN: [id number] Arrival date & time: 06/21/24  2133     Patient presents with: Emesis and Hyperglycemia   Anthony Graham is a 28 y.o. male.  {Add pertinent medical, surgical, social history, OB history to YEP:67052} The history is provided by the patient and medical records.  Emesis Hyperglycemia Associated symptoms: vomiting   Anthony Graham is a 28 y.o. male who presents to the Emergency Department complaining of vomiting and abdominal pain.  He presents to the emergency department accompanied by his mother for evaluation of central abdominal pain, vomiting and diarrhea that started at 4 AM Friday morning.  No associated fever or dysuria.  He reports similar previous symptoms secondary to gastroparesis.  No prior abdominal surgeries.  He does have an insulin  pump but states that it was out of insulin  for about 2 hours.  That occurred just prior to ED arrival.  He states that yesterday he did have his insulin  pump in place and his sugars were running high.  He does report that he often feels to put his carbs in his pump. Has a history of hypertension, retinopathy, CKD.     Prior to Admission medications   Medication Sig Start Date End Date Taking? Authorizing Provider  amLODipine  (NORVASC ) 10 MG tablet Take 1 tablet (10 mg total) by mouth daily. 12/10/21   Gonfa, Taye T, MD  carvedilol  (COREG ) 12.5 MG tablet Take 2 tablets (25 mg total) by mouth 2 (two) times daily with a meal. 03/28/24 04/27/24  Perri DELENA Meliton Mickey., MD  Continuous Glucose Sensor (DEXCOM G6 SENSOR) MISC 1 each by Other route as directed. Change every 10 days. 02/28/24   [provider]  Continuous Glucose Transmitter (DEXCOM G6 TRANSMITTER) MISC 1 each by Other route as directed. 11/14/23   [provider]  dicyclomine  (BENTYL ) 10 MG capsule Take 10 mg by mouth as needed for spasms.    [provider]  Elastic Bandages  & Supports (MEDICAL COMPRESSION STOCKINGS) MISC Dispense 1 pair of knee-high compression stockings, to be used when you are continuously standing 03/26/21   Pollina, Lonni PARAS, MD  ergocalciferol (VITAMIN D2) 1.25 MG (50000 UT) capsule Take 50,000 Units by mouth every Wednesday.    [provider]  escitalopram  (LEXAPRO ) 5 MG tablet Take 5 mg by mouth as needed (anxiety). 07/17/22   [provider]  Glucagon (BAQSIMI ONE PACK) 3 MG/DOSE POWD Place 1 spray into the nose as needed (for low BGL). 02/07/21   [provider]  hydrALAZINE  (APRESOLINE ) 50 MG tablet Take 1 tablet (50 mg total) by mouth 3 (three) times daily. 03/28/24 04/27/24  Perri DELENA Meliton Mickey., MD  insulin  aspart (NOVOLOG ) 100 UNIT/ML FlexPen Inject 120 Units into the skin every 3 (three) days. Via insulin  pump - 120 units every three days. 01/30/17   [provider]  insulin  degludec (TRESIBA) 100 UNIT/ML FlexTouch Pen Inject 26 Units into the skin daily. Use if insulin  pump malfunctions. 05/23/22   [provider]  Insulin  Disposable Pump (OMNIPOD 5 G6 PODS, GEN 5,) MISC Inject 1 Device into the skin every 3 (three) days.    [provider]  metoCLOPramide  (REGLAN ) 5 MG tablet Take 1 tablet (5 mg total) by mouth every 6 (six) hours as needed for nausea or vomiting. 03/28/24 04/27/24  Perri DELENA Meliton Mickey., MD  ondansetron  (ZOFRAN -ODT) 4 MG disintegrating tablet Take 1 tablet (4 mg total) by mouth every 8 (eight)  hours as needed for nausea or vomiting. Patient taking differently: Take 4 mg by mouth as needed for nausea or vomiting. 01/10/24   Tegeler, Lonni PARAS, MD  pantoprazole  (PROTONIX ) 40 MG tablet Take 1 tablet (40 mg total) by mouth daily. Patient taking differently: Take 40 mg by mouth as needed (heartburn). 04/30/22   Regalado, Belkys A, MD  torsemide (DEMADEX) 10 MG tablet Take 10 mg by mouth as needed (swelling). 12/27/22   [provider]  potassium chloride  SA  (KLOR-CON ) 20 MEQ tablet Take 1 tablet (20 mEq total) by mouth daily. 03/02/20 08/05/20  Petrucelli, Lucie SAUNDERS, PA-C    Allergies: Apple juice, Banana, and Gramineae pollens    Review of Systems  Gastrointestinal:  Positive for vomiting.  All other systems reviewed and are negative.   Updated Vital Signs BP (!) 175/117   Pulse (!) 126   Temp 98 F (36.7 C)   Resp 18   Ht 5' 6 (1.676 m)   Wt 59 kg   SpO2 99%   BMI 20.98 kg/m   Physical Exam Vitals and nursing note reviewed.  Constitutional:      General: He is in acute distress.     Appearance: He is well-developed. He is ill-appearing.  HENT:     Head: Normocephalic and atraumatic.  Cardiovascular:     Rate and Rhythm: Regular rhythm. Tachycardia present.     Heart sounds: No murmur heard. Pulmonary:     Effort: Pulmonary effort is normal. No respiratory distress.     Breath sounds: Normal breath sounds.  Abdominal:     Palpations: Abdomen is soft.     Tenderness: There is no abdominal tenderness. There is no guarding or rebound.  Musculoskeletal:        General: No swelling or tenderness.  Skin:    General: Skin is warm and dry.  Neurological:     Mental Status: He is alert and oriented to person, place, and time.  Psychiatric:        Behavior: Behavior normal.     (all labs ordered are listed, but only abnormal results are displayed) Labs Reviewed  CBC WITH DIFFERENTIAL/PLATELET - Abnormal; Notable for the following components:      Result Value   WBC 13.8 (*)    Hemoglobin 12.7 (*)    HCT 38.8 (*)    MCV 74.8 (*)    MCH 24.5 (*)    Neutro Abs 12.4 (*)    All other components within normal limits  COMPREHENSIVE METABOLIC PANEL WITH GFR - Abnormal; Notable for the following components:   CO2 18 (*)    Glucose, Bld 436 (*)    BUN 28 (*)    Creatinine, Ser 3.03 (*)    ALT 64 (*)    Alkaline Phosphatase 138 (*)    GFR, Estimated 28 (*)    Anion gap 28 (*)    All other components within normal limits   CBG MONITORING, ED - Abnormal; Notable for the following components:   Glucose-Capillary 450 (*)    All other components within normal limits  LACTIC ACID, PLASMA  LACTIC ACID, PLASMA  BETA-HYDROXYBUTYRIC ACID  URINALYSIS, W/ REFLEX TO CULTURE (INFECTION SUSPECTED)  I-STAT VENOUS BLOOD GAS, ED    EKG: EKG Interpretation Date/Time:  Saturday June 21 2024 21:45:12 EDT Ventricular Rate:  127 PR Interval:  134 QRS Duration:  76 QT Interval:  308 QTC Calculation: 448 R Axis:   66  Text Interpretation: Sinus tachycardia LAE, consider biatrial  enlargement RSR' in V1 or V2, probably normal variant Artifact in lead(s) V5 V6 Similar to January 2025 Confirmed by Dreama Longs ([id number]) on 06/21/2024 9:47:46 PM  Radiology: No results found.  {Document cardiac monitor, telemetry assessment procedure when appropriate:32947} Procedures   Medications Ordered in the ED  lactated ringers  bolus 1,000 mL (has no administration in time range)  ondansetron  (ZOFRAN ) injection 4 mg (has no administration in time range)      {Click here for ABCD2, HEART and other calculators REFRESH Note before signing:1}                              Medical Decision Making Amount and/or Complexity of Data Reviewed Labs: ordered.  Risk Prescription drug management.   ***  {Document critical care time when appropriate  Document review of labs and clinical decision tools ie CHADS2VASC2, etc  Document your independent review of radiology images and any outside records  Document your discussion with family members, caretakers and with consultants  Document social determinants of health affecting pt's care  Document your decision making why or why not admission, treatments were needed:32947:::1}   Final diagnoses:  None    ED Discharge Orders     None

## 2024-06-21 NOTE — ED Triage Notes (Signed)
 Pt with NV, abd pain since 0400; can't keep anything down;  Sts he was without his insulin  pump for about 2 hrs d/t the pod had expired

## 2024-06-22 ENCOUNTER — Encounter (HOSPITAL_COMMUNITY): Payer: Self-pay | Admitting: Family Medicine

## 2024-06-22 DIAGNOSIS — T383X6A Underdosing of insulin and oral hypoglycemic [antidiabetic] drugs, initial encounter: Secondary | ICD-10-CM | POA: Diagnosis present

## 2024-06-22 DIAGNOSIS — I129 Hypertensive chronic kidney disease with stage 1 through stage 4 chronic kidney disease, or unspecified chronic kidney disease: Secondary | ICD-10-CM | POA: Diagnosis present

## 2024-06-22 DIAGNOSIS — R739 Hyperglycemia, unspecified: Secondary | ICD-10-CM | POA: Diagnosis present

## 2024-06-22 DIAGNOSIS — Z794 Long term (current) use of insulin: Secondary | ICD-10-CM | POA: Diagnosis not present

## 2024-06-22 DIAGNOSIS — E1010 Type 1 diabetes mellitus with ketoacidosis without coma: Secondary | ICD-10-CM

## 2024-06-22 DIAGNOSIS — Z833 Family history of diabetes mellitus: Secondary | ICD-10-CM | POA: Diagnosis not present

## 2024-06-22 DIAGNOSIS — R000 Tachycardia, unspecified: Secondary | ICD-10-CM | POA: Diagnosis present

## 2024-06-22 DIAGNOSIS — N184 Chronic kidney disease, stage 4 (severe): Secondary | ICD-10-CM | POA: Diagnosis present

## 2024-06-22 DIAGNOSIS — I160 Hypertensive urgency: Secondary | ICD-10-CM | POA: Diagnosis present

## 2024-06-22 DIAGNOSIS — Z91148 Patient's other noncompliance with medication regimen for other reason: Secondary | ICD-10-CM | POA: Diagnosis not present

## 2024-06-22 DIAGNOSIS — E1022 Type 1 diabetes mellitus with diabetic chronic kidney disease: Secondary | ICD-10-CM | POA: Diagnosis present

## 2024-06-22 DIAGNOSIS — E10319 Type 1 diabetes mellitus with unspecified diabetic retinopathy without macular edema: Secondary | ICD-10-CM | POA: Diagnosis present

## 2024-06-22 DIAGNOSIS — E1065 Type 1 diabetes mellitus with hyperglycemia: Secondary | ICD-10-CM | POA: Diagnosis present

## 2024-06-22 DIAGNOSIS — Z9641 Presence of insulin pump (external) (internal): Secondary | ICD-10-CM | POA: Diagnosis present

## 2024-06-22 DIAGNOSIS — K3184 Gastroparesis: Secondary | ICD-10-CM | POA: Diagnosis present

## 2024-06-22 DIAGNOSIS — Z79899 Other long term (current) drug therapy: Secondary | ICD-10-CM | POA: Diagnosis not present

## 2024-06-22 DIAGNOSIS — D72829 Elevated white blood cell count, unspecified: Secondary | ICD-10-CM | POA: Diagnosis present

## 2024-06-22 DIAGNOSIS — E1043 Type 1 diabetes mellitus with diabetic autonomic (poly)neuropathy: Secondary | ICD-10-CM | POA: Diagnosis present

## 2024-06-22 DIAGNOSIS — E876 Hypokalemia: Secondary | ICD-10-CM | POA: Diagnosis present

## 2024-06-22 DIAGNOSIS — Z91018 Allergy to other foods: Secondary | ICD-10-CM | POA: Diagnosis not present

## 2024-06-22 LAB — BASIC METABOLIC PANEL WITH GFR
Anion gap: 27 — ABNORMAL HIGH (ref 5–15)
Anion gap: 16 — ABNORMAL HIGH (ref 5–15)
Anion gap: 11 (ref 5–15)
Anion gap: 12 (ref 5–15)
Anion gap: 10 (ref 5–15)
Anion gap: 11 (ref 5–15)
BUN: 32 mg/dL — ABNORMAL HIGH (ref 6–20)
BUN: 33 mg/dL — ABNORMAL HIGH (ref 6–20)
BUN: 33 mg/dL — ABNORMAL HIGH (ref 6–20)
BUN: 32 mg/dL — ABNORMAL HIGH (ref 6–20)
BUN: 29 mg/dL — ABNORMAL HIGH (ref 6–20)
BUN: 25 mg/dL — ABNORMAL HIGH (ref 6–20)
CO2: 17 mmol/L — ABNORMAL LOW (ref 22–32)
CO2: 21 mmol/L — ABNORMAL LOW (ref 22–32)
CO2: 23 mmol/L (ref 22–32)
CO2: 24 mmol/L (ref 22–32)
CO2: 24 mmol/L (ref 22–32)
CO2: 22 mmol/L (ref 22–32)
Calcium: 9.6 mg/dL (ref 8.9–10.3)
Calcium: 9.4 mg/dL (ref 8.9–10.3)
Calcium: 9.1 mg/dL (ref 8.9–10.3)
Calcium: 9 mg/dL (ref 8.9–10.3)
Calcium: 9.4 mg/dL (ref 8.9–10.3)
Calcium: 9 mg/dL (ref 8.9–10.3)
Chloride: 98 mmol/L (ref 98–111)
Chloride: 108 mmol/L (ref 98–111)
Chloride: 112 mmol/L — ABNORMAL HIGH (ref 98–111)
Chloride: 107 mmol/L (ref 98–111)
Chloride: 108 mmol/L (ref 98–111)
Chloride: 106 mmol/L (ref 98–111)
Creatinine, Ser: 3.28 mg/dL — ABNORMAL HIGH (ref 0.61–1.24)
Creatinine, Ser: 3.22 mg/dL — ABNORMAL HIGH (ref 0.61–1.24)
Creatinine, Ser: 3.28 mg/dL — ABNORMAL HIGH (ref 0.61–1.24)
Creatinine, Ser: 3.39 mg/dL — ABNORMAL HIGH (ref 0.61–1.24)
Creatinine, Ser: 3.06 mg/dL — ABNORMAL HIGH (ref 0.61–1.24)
Creatinine, Ser: 3.06 mg/dL — ABNORMAL HIGH (ref 0.61–1.24)
GFR, Estimated: 26 mL/min — ABNORMAL LOW (ref >60)
GFR, Estimated: 26 mL/min — ABNORMAL LOW (ref >60)
GFR, Estimated: 26 mL/min — ABNORMAL LOW (ref >60)
GFR, Estimated: 25 mL/min — ABNORMAL LOW (ref >60)
GFR, Estimated: 28 mL/min — ABNORMAL LOW (ref >60)
GFR, Estimated: 28 mL/min — ABNORMAL LOW (ref >60)
Glucose, Bld: 491 mg/dL — ABNORMAL HIGH (ref 70–99)
Glucose, Bld: 160 mg/dL — ABNORMAL HIGH (ref 70–99)
Glucose, Bld: 180 mg/dL — ABNORMAL HIGH (ref 70–99)
Glucose, Bld: 122 mg/dL — ABNORMAL HIGH (ref 70–99)
Glucose, Bld: 150 mg/dL — ABNORMAL HIGH (ref 70–99)
Glucose, Bld: 264 mg/dL — ABNORMAL HIGH (ref 70–99)
Potassium: 4.4 mmol/L (ref 3.5–5.1)
Potassium: 3.7 mmol/L (ref 3.5–5.1)
Potassium: 3.8 mmol/L (ref 3.5–5.1)
Potassium: 3.8 mmol/L (ref 3.5–5.1)
Potassium: 3.8 mmol/L (ref 3.5–5.1)
Potassium: 3.8 mmol/L (ref 3.5–5.1)
Sodium: 143 mmol/L (ref 135–145)
Sodium: 145 mmol/L (ref 135–145)
Sodium: 146 mmol/L — ABNORMAL HIGH (ref 135–145)
Sodium: 143 mmol/L (ref 135–145)
Sodium: 142 mmol/L (ref 135–145)
Sodium: 139 mmol/L (ref 135–145)

## 2024-06-22 LAB — I-STAT VENOUS BLOOD GAS, ED
Acid-base deficit: 4 mmol/L — ABNORMAL HIGH (ref 0.0–2.0)
Bicarbonate: 20.5 mmol/L (ref 20.0–28.0)
Calcium, Ion: 1.11 mmol/L — ABNORMAL LOW (ref 1.15–1.40)
HCT: 40 % (ref 39.0–52.0)
Hemoglobin: 13.6 g/dL (ref 13.0–17.0)
O2 Saturation: 92 %
Patient temperature: 98
Potassium: 4.2 mmol/L (ref 3.5–5.1)
Sodium: 141 mmol/L (ref 135–145)
TCO2: 22 mmol/L (ref 22–32)
pCO2, Ven: 33.3 mmHg — ABNORMAL LOW (ref 44–60)
pH, Ven: 7.397 (ref 7.25–7.43)
pO2, Ven: 62 mmHg — ABNORMAL HIGH (ref 32–45)

## 2024-06-22 LAB — BETA-HYDROXYBUTYRIC ACID
Beta-Hydroxybutyric Acid: 2.88 mmol/L — ABNORMAL HIGH (ref 0.05–0.27)
Beta-Hydroxybutyric Acid: 4.55 mmol/L — ABNORMAL HIGH (ref 0.05–0.27)
Beta-Hydroxybutyric Acid: 5.21 mmol/L — ABNORMAL HIGH (ref 0.05–0.27)

## 2024-06-22 LAB — GLUCOSE, CAPILLARY
Glucose-Capillary: 113 mg/dL — ABNORMAL HIGH (ref 70–99)
Glucose-Capillary: 142 mg/dL — ABNORMAL HIGH (ref 70–99)
Glucose-Capillary: 143 mg/dL — ABNORMAL HIGH (ref 70–99)
Glucose-Capillary: 154 mg/dL — ABNORMAL HIGH (ref 70–99)
Glucose-Capillary: 164 mg/dL — ABNORMAL HIGH (ref 70–99)
Glucose-Capillary: 167 mg/dL — ABNORMAL HIGH (ref 70–99)
Glucose-Capillary: 172 mg/dL — ABNORMAL HIGH (ref 70–99)
Glucose-Capillary: 172 mg/dL — ABNORMAL HIGH (ref 70–99)
Glucose-Capillary: 175 mg/dL — ABNORMAL HIGH (ref 70–99)
Glucose-Capillary: 178 mg/dL — ABNORMAL HIGH (ref 70–99)
Glucose-Capillary: 182 mg/dL — ABNORMAL HIGH (ref 70–99)
Glucose-Capillary: 184 mg/dL — ABNORMAL HIGH (ref 70–99)
Glucose-Capillary: 196 mg/dL — ABNORMAL HIGH (ref 70–99)
Glucose-Capillary: 208 mg/dL — ABNORMAL HIGH (ref 70–99)
Glucose-Capillary: 211 mg/dL — ABNORMAL HIGH (ref 70–99)
Glucose-Capillary: 223 mg/dL — ABNORMAL HIGH (ref 70–99)
Glucose-Capillary: 227 mg/dL — ABNORMAL HIGH (ref 70–99)
Glucose-Capillary: 254 mg/dL — ABNORMAL HIGH (ref 70–99)
Glucose-Capillary: 280 mg/dL — ABNORMAL HIGH (ref 70–99)
Glucose-Capillary: 98 mg/dL (ref 70–99)

## 2024-06-22 LAB — URINALYSIS, W/ REFLEX TO CULTURE (INFECTION SUSPECTED)
Bilirubin Urine: NEGATIVE
Glucose, UA: >=500 mg/dL — AB
Ketones, ur: 15 mg/dL — AB
Leukocytes,Ua: NEGATIVE
Nitrite: NEGATIVE
Protein, ur: >=300 mg/dL — AB
Specific Gravity, Urine: 1.015 (ref 1.005–1.030)
pH: 5.5 (ref 5.0–8.0)

## 2024-06-22 LAB — CBG MONITORING, ED
Glucose-Capillary: 317 mg/dL — ABNORMAL HIGH (ref 70–99)
Glucose-Capillary: 408 mg/dL — ABNORMAL HIGH (ref 70–99)
Glucose-Capillary: 441 mg/dL — ABNORMAL HIGH (ref 70–99)

## 2024-06-22 LAB — LACTIC ACID, PLASMA
Lactic Acid, Venous: 2.2 mmol/L (ref 0.5–1.9)
Lactic Acid, Venous: 2.3 mmol/L (ref 0.5–1.9)

## 2024-06-22 LAB — HIV ANTIBODY (ROUTINE TESTING W REFLEX): HIV Screen 4th Generation wRfx: NONREACTIVE

## 2024-06-22 LAB — LIPASE, BLOOD: Lipase: 29 U/L (ref 11–51)

## 2024-06-22 MED ORDER — FENTANYL CITRATE PF 50 MCG/ML IJ SOSY
25.0000 ug | PREFILLED_SYRINGE | INTRAMUSCULAR | Status: DC | PRN
  Administered 2024-06-23 (×2): 50 ug via INTRAVENOUS
  Filled 2024-06-22 (×2): qty 1

## 2024-06-22 MED ORDER — INSULIN REGULAR(HUMAN) IN NACL 100-0.9 UT/100ML-% IV SOLN
INTRAVENOUS | Status: DC
  Administered 2024-06-24: 1.6 [IU]/h via INTRAVENOUS
  Filled 2024-06-22: qty 100

## 2024-06-22 MED ORDER — CARVEDILOL 12.5 MG PO TABS
25.0000 mg | ORAL_TABLET | Freq: Two times a day (BID) | ORAL | Status: DC
  Administered 2024-06-22 – 2024-06-24 (×5): 25 mg via ORAL
  Filled 2024-06-22 (×6): qty 2

## 2024-06-22 MED ORDER — PROCHLORPERAZINE EDISYLATE 10 MG/2ML IJ SOLN
5.0000 mg | INTRAMUSCULAR | Status: DC | PRN
  Administered 2024-06-22 (×2): 5 mg via INTRAVENOUS
  Filled 2024-06-22 (×2): qty 2

## 2024-06-22 MED ORDER — ACETAMINOPHEN 325 MG PO TABS: 650.0000 mg | ORAL_TABLET | Freq: Four times a day (QID) | ORAL | Status: DC | PRN

## 2024-06-22 MED ORDER — OXYCODONE HCL 5 MG PO TABS
5.0000 mg | ORAL_TABLET | ORAL | Status: DC | PRN
  Administered 2024-06-22: 5 mg via ORAL
  Filled 2024-06-22: qty 1

## 2024-06-22 MED ORDER — LACTATED RINGERS IV SOLN: INTRAVENOUS | Status: AC

## 2024-06-22 MED ORDER — SODIUM CHLORIDE 0.9 % IV BOLUS
500.0000 mL | Freq: Once | INTRAVENOUS | Status: AC
  Administered 2024-06-22: 500 mL via INTRAVENOUS

## 2024-06-22 MED ORDER — HEPARIN SODIUM (PORCINE) 5000 UNIT/ML IJ SOLN
5000.0000 [IU] | Freq: Three times a day (TID) | INTRAMUSCULAR | Status: DC
  Administered 2024-06-22 – 2024-06-24 (×3): 5000 [IU] via SUBCUTANEOUS
  Filled 2024-06-22 (×5): qty 1

## 2024-06-22 MED ORDER — LABETALOL HCL 5 MG/ML IV SOLN
10.0000 mg | Freq: Once | INTRAVENOUS | Status: DC
  Filled 2024-06-22: qty 4

## 2024-06-22 MED ORDER — DEXTROSE IN LACTATED RINGERS 5 % IV SOLN: INTRAVENOUS | Status: AC

## 2024-06-22 MED ORDER — LABETALOL HCL 5 MG/ML IV SOLN
10.0000 mg | INTRAVENOUS | Status: DC | PRN
  Administered 2024-06-22 – 2024-06-23 (×2): 10 mg via INTRAVENOUS
  Filled 2024-06-22 (×4): qty 4

## 2024-06-22 MED ORDER — HYDRALAZINE HCL 50 MG PO TABS
50.0000 mg | ORAL_TABLET | Freq: Three times a day (TID) | ORAL | Status: DC
  Administered 2024-06-22 – 2024-06-23 (×4): 50 mg via ORAL
  Filled 2024-06-22 (×4): qty 1

## 2024-06-22 MED ORDER — AMLODIPINE BESYLATE 10 MG PO TABS
10.0000 mg | ORAL_TABLET | Freq: Every day | ORAL | Status: DC
  Administered 2024-06-22 – 2024-06-24 (×3): 10 mg via ORAL
  Filled 2024-06-22 (×3): qty 1

## 2024-06-22 MED ORDER — CHLORHEXIDINE GLUCONATE CLOTH 2 % EX PADS
6.0000 | MEDICATED_PAD | Freq: Every day | CUTANEOUS | Status: DC
  Administered 2024-06-22 – 2024-06-23 (×2): 6 via TOPICAL

## 2024-06-22 MED ORDER — DEXTROSE 50 % IV SOLN: 0.0000 mL | INTRAVENOUS | Status: DC | PRN

## 2024-06-22 NOTE — Progress Notes (Signed)
 PROGRESS NOTE  Anthony Graham  DOB: 06-07-1996  PCP: Freddrick Johns FMW:[id number]  DOA: 06/21/2024  LOS: 0 days  Hospital Day: 2  Brief narrative: Anthony Graham is a 28 y.o. male with PMH significant for HTN, DM1 since the age of 82, gastroparesis, retinopathy, CKD 4 7/12, patient presented to ED at [facility] with complaint of nausea, vomiting, abdominal pain for several hours.  Patient uses insulin  pump and apparently it stopped delivering insulin  for couple of hours leading to symptoms that progressed and hence presented to the ED  In the ED, patient was afebrile, heart rate in 120s, blood pressure was significantly elevated up to 210/120. Initial blood sugar level was over 450. VBG with pH 7.4, bicarb 20 BMP with creatinine elevated to 3.03, lactic acid elevated 2.2, WBC 13.8, hemoglobin 12.7, serum ketones elevated to 5.1, anion gap elevated to 28 Given persistent hyperglycemia, patient was started on insulin  drip, IV hydration, IV antibiotics, IV analgesics Accepted to admission at [facility].  Subjective: Patient was seen and examined this morning.  Pleasant young African-American male. Sitting up at the edge of the bed.  Nauseous.  Does not feel like eating still.  Family not at bedside.  Chart reviewed Remains tachycardic to 100s, blood pressure elevated to 170s this morning, breathing on room air Most recent labs from this morning with sodium 146, BUN/creatinine 33/3.28, glucose 180  Assessment and plan: Early DKA Metabolic acidosis Lactic acidosis Presented with nausea, vomiting, abdominal pain after insulin  pump malfunction Noted to have elevated blood sugar, elevated serum ketones, elevated anion gap and downtrending serum bicarb Started on DKA protocol with insulin  infusion, IV fluid, electrolyte monitoring Most recent BMP from this morning with anion gap closed, blood sugar 154 Patient however is still nauseous and does not feel like he can try  oral intake. I will continue insulin  drip for now.  Type 1 diabetes mellitus A1c 10 in 05/29/2024  PTA meds-insulin  pump, apparently malfunctioned Diabetes care coordinator consulted Once his symptoms improve, we can plan to transition him to subcutaneous insulin  or directly to insulin  pump if he has the kit available to resume it.  I have asked him to get the pump brought from home. Recent Labs  Lab 06/22/24 0627 06/22/24 0724 06/22/24 0825 06/22/24 0928 06/22/24 1025  GLUCAP 211* 178* 154* 142* 113*   Hypertensive urgency Initial blood pressure was elevated over 200 PTA meds-carvedilol  25 mg twice daily, amlodipine  10 mg daily, hydralazine  50 mg 3 times daily Currently continued on all IV labetalol  as needed  CKD 4 Creatinine remains at baseline. Continue monitor  Recent Labs    01/10/24 0503 01/11/24 0416 03/26/24 0941 03/26/24 1641 03/27/24 0259 03/28/24 0305 06/21/24 2152 06/22/24 0010 06/22/24 0346 06/22/24 0736  BUN 51* 42* 34* 35* 34* 36* 28* 32* 33* 33*  CREATININE 4.11* 3.42* 3.53* 3.34* 3.18* 3.25* 3.03* 3.28* 3.22* 3.28*  CO2 21* 23 23 25 23  21* 18* 17* 21* 23   Diabetic gastroparesis Reglan  as needed IV Protonix    Mobility: Encourage ambulation  Goals of care   Code Status: Full Code     DVT prophylaxis:  heparin  injection 5,000 Units Start: 06/22/24 0600   Antimicrobials: None Fluid: Currently on D5 LR per protocol Consultants: Diabetes care coordinator Family Communication: None at bedside  Status: Observation Level of care:  Stepdown   Patient is from: Home Needs to continue in-hospital care: On IV insulin  drip, needs 1-2 more days of inpatient monitoring Anticipated d/c to: Hopefully  home in 1 to 2 days    Diet:  Diet Order     None       Scheduled Meds:  amLODipine   10 mg Oral Daily   carvedilol   25 mg Oral BID WC   heparin   5,000 Units Subcutaneous Q8H   hydrALAZINE   50 mg Oral TID    PRN meds: acetaminophen ,  dextrose , fentaNYL  (SUBLIMAZE ) injection, labetalol , oxyCODONE , prochlorperazine    Infusions:   dextrose  5% lactated ringers  125 mL/hr at 06/22/24 0648   insulin  2 Units/hr (06/22/24 0648)   lactated ringers  Stopped (06/22/24 0325)    Antimicrobials: Anti-infectives (From admission, onward)    None       Objective: Vitals:   06/22/24 0933 06/22/24 1000  BP: (!) 190/142 (!) 137/91  Pulse:  96  Resp:  15  Temp:    SpO2:  99%    Intake/Output Summary (Last 24 hours) at 06/22/2024 1106 Last data filed at 06/22/2024 0648 Gross per 24 hour  Intake 2951.56 ml  Output --  Net 2951.56 ml   Filed Weights   06/21/24 2141  Weight: 59 kg   Weight change:  Body mass index is 20.98 kg/m.   Physical Exam: General exam: Pleasant, young African-American male. Skin: No rashes, lesions or ulcers. HEENT: Atraumatic, normocephalic, no obvious bleeding Lungs: Clear to auscultation bilaterally,  CVS: S1, S2, no murmur,   GI/Abd: Soft, mild epigastric eminence, nondistended, bowel sound present,   CNS: Alert, awake, oriented x 3 Psychiatry: Sad affect Extremities: No pedal edema, no calf tenderness,   Data Review: I have personally reviewed the laboratory data and studies available.  F/u labs ordered Unresulted Labs (From admission, onward)     Start     Ordered   06/23/24 0500  Basic metabolic panel with GFR  Tomorrow morning,   R       Question:  Specimen collection method  Answer:  Lab=Lab collect   06/22/24 1106   06/23/24 0500  CBC with Differential/Platelet  Tomorrow morning,   R       Question:  Specimen collection method  Answer:  Lab=Lab collect   06/22/24 1106   06/22/24 0321  HIV Antibody (routine testing w rflx)  (HIV Antibody (Routine testing w reflex) panel)  Once,   R        06/22/24 0321   06/22/24 0321  Basic metabolic panel  (Diabetes Ketoacidosis (DKA))  STAT Now then every 4 hours ,   R      06/22/24 0321            Signed, Chapman Rota, MD [facility] 06/22/2024

## 2024-06-22 NOTE — Progress Notes (Signed)
 Hospitalist Transfer Note:    Nursing staff, Please call [facility] Admits & Consults System-Wide number on Amion [phone]) as soon as patient's arrival, so appropriate admitting provider can evaluate the pt.   Transferring facility: [facility] Requesting provider: Dr. Griselda (EDP at [facility]) Reason for transfer: admission for further evaluation and management of hyperglycemia with borderline dka .     28 year old male with a history of poorly controlled type 1 diabetes mellitus complicated by diabetic gastroparesis, CKD, essential hypertension, who presented to [facility] ED complaining of 1 day of abdominal discomfort, nausea, vomiting.    The patient has a history of poorly controlled type 1 diabetes, on insulin  pump.  He notes that he had recently run out of insulin  for his pump, before being able to resume his insulin  pump over the last few days.  He notes that he does not modify his insulin  input based upon carb intake.   He reports development of abdominal discomfort associate with recurrent nausea/vomiting starting around 4 AM on 06/21/2024.  He has been unable to tolerate any significant p.o. in the interval.  He notes a history of diabetic gastroparesis, and feels that his current symptoms are similar to prior episodes of diabetic gastroparesis.  Vital signs in the ED were notable for the following: Afebrile; initial heart rates in the 120s, subsequent decreasing in the 110s following interval IV fluids.   Labs were notable for the following: Initial CBG 450.  CMP notable for bicarb 18 and anion gap 28, potassium 4.4. vgg 7.397/33.3/62/20.5.  Beta-hydroxybutyrate acid has been ordered, although it is a send out lab, with result currently pending.  Medications administered prior to transfer included the following: Is received 2 L LR bolus followed by continuous IV fluids as well as insulin  drip via Endo tool.  He has also received a dose of Zofran , dose of Reglan , as well as a dose of IV  morphine .  Subsequently, I accepted this patient for transfer for observation to a sdu bed at [facility] or [facility] (first available) for further work-up and management of the above.      Eva Pore, DO Hospitalist

## 2024-06-22 NOTE — Inpatient Diabetes Management (Signed)
 Inpatient Diabetes Program Recommendations  AACE/ADA: New Consensus Statement on Inpatient Glycemic Control (2015)  Target Ranges:  Prepandial:   less than 140 mg/dL      Peak postprandial:   less than 180 mg/dL (1-2 hours)      Critically ill patients:  140 - 180 mg/dL   Lab Results  Component Value Date   GLUCAP 142 (H) 06/22/2024   HGBA1C 9.9 (H) 01/10/2024    Diabetes history: DM1 Outpatient Diabetes medications: Insulin  pump Omnipod with backup: Tresiba 26 units daily, Novolog  s/s Current orders for Inpatient glycemic control: IV insulin   Inpatient Diabetes Program Recommendations:   Patient has had 2 inpatient hospital admissions and 2 ED admissions over the past 6 months. DM coordinator spoke with patient on 03-28-24 (patient ran out of pump supplies). Attempted to call cell and room phone (DM coordinator on system wide call working remotely). Requesting RN to assist in reaching patient when he is available for phone call.  Sees Warren Batty for T1DM.   Thank you, Alyaan Budzynski E. Romy Mcgue, RN, MSN, CDCES  Diabetes Coordinator Inpatient Glycemic Control Team Team Pager [phone] (8am-5pm) 06/22/2024 9:44 AM

## 2024-06-22 NOTE — Plan of Care (Signed)
   Problem: Health Behavior/Discharge Planning: Goal: Ability to manage health-related needs will improve Outcome: Progressing   Problem: Clinical Measurements: Goal: Ability to maintain clinical measurements within normal limits will improve Outcome: Progressing Goal: Diagnostic test results will improve Outcome: Progressing

## 2024-06-22 NOTE — ED Notes (Signed)
 [facility] called for transport.

## 2024-06-22 NOTE — ED Notes (Signed)
 Patient transported to [facility] ED via [facility] at this time.  Next CBG due at [facility]

## 2024-06-22 NOTE — Plan of Care (Signed)
  Problem: Education: Goal: Knowledge of General Education information will improve Description: Including pain rating scale, medication(s)/side effects and non-pharmacologic comfort measures Outcome: Progressing   Problem: Clinical Measurements: Goal: Respiratory complications will improve Outcome: Progressing   Problem: Skin Integrity: Goal: Risk for impaired skin integrity will decrease Outcome: Progressing   Problem: Clinical Measurements: Goal: Cardiovascular complication will be avoided Outcome: Not Progressing   Problem: Nutrition: Goal: Adequate nutrition will be maintained Outcome: Not Progressing   Problem: Pain Managment: Goal: General experience of comfort will improve and/or be controlled Outcome: Not Progressing   Problem: Nutritional: Goal: Maintenance of adequate nutrition will improve Outcome: Not Progressing

## 2024-06-22 NOTE — H&P (Signed)
 History and Physical    Anthony Graham FMW:[id number] DOB: [birth year] DOA: 06/21/2024  PCP: Pcp, No   Patient coming from: Home   Chief Complaint: Abdominal pain, N/V, hyperglycemia   HPI: Anthony Graham is a 28 y.o. male with medical history significant for type 1 diabetes mellitus, hypertension, CKD 4, retinopathy, and gastroparesis who presents with abdominal pain, nausea, vomiting, and hyperglycemia.  Patient reports that his insulin  pump stopped delivering insulin  for a couple hours and he developed abdominal pain, nausea, vomiting, and hyperglycemia early yesterday morning.  He continued to have the symptoms throughout the day and eventually sought evaluation in the ED.  By time of admission, patient reports that his abdominal pain has resolved, nausea has resolved with medications, and he is asking to eat.  [facility] ED Course: Upon arrival to the ED, patient is found to be afebrile and saturating well on room air with elevated HR and elevated BP.  Labs are most notable for glucose 136, serum bicarbonate 18, anion gap 28, WBC 13,800, and lactate 2.2.    He was given 2 L LR, Zofran , Reglan , morphine , IV labetalol , and was started on insulin  infusion.  He was transferred to [facility] for admission.  Review of Systems:  All other systems reviewed and apart from HPI, are negative.  Past Medical History:  Diagnosis Date   Diabetic retinopathy (HCC)    DM (diabetes mellitus) (HCC)    Gastroparesis    HTN (hypertension)     Past Surgical History:  Procedure Laterality Date   ESOPHAGOGASTRODUODENOSCOPY     EYE SURGERY      Social History:   reports that he has never smoked. He has never been exposed to tobacco smoke. He has never used smokeless tobacco. He reports that he does not currently use alcohol. He reports current drug use. Drug: Marijuana.  Allergies  Allergen Reactions   Apple Juice Itching   Banana Itching   Gramineae Pollens Other (See Comments)     Sore throat, runny nose, sneezing    Family History  Problem Relation Age of Onset   Diabetes Mother    Diabetes Other      Prior to Admission medications   Medication Sig Start Date End Date Taking? Authorizing Provider  amLODipine  (NORVASC ) 10 MG tablet Take 1 tablet (10 mg total) by mouth daily. 12/10/21   Gonfa, Taye T, MD  carvedilol  (COREG ) 12.5 MG tablet Take 2 tablets (25 mg total) by mouth 2 (two) times daily with a meal. 03/28/24 04/27/24  Perri DELENA Meliton Mickey., MD  Continuous Glucose Sensor (DEXCOM G6 SENSOR) MISC 1 each by Other route as directed. Change every 10 days. 02/28/24   [provider]  Continuous Glucose Transmitter (DEXCOM G6 TRANSMITTER) MISC 1 each by Other route as directed. 11/14/23   [provider]  dicyclomine  (BENTYL ) 10 MG capsule Take 10 mg by mouth as needed for spasms.    [provider]  Elastic Bandages & Supports (MEDICAL COMPRESSION STOCKINGS) MISC Dispense 1 pair of knee-high compression stockings, to be used when you are continuously standing 03/26/21   Pollina, Lonni PARAS, MD  ergocalciferol (VITAMIN D2) 1.25 MG (50000 UT) capsule Take 50,000 Units by mouth every Wednesday.    [provider]  escitalopram  (LEXAPRO ) 5 MG tablet Take 5 mg by mouth as needed (anxiety). 07/17/22   [provider]  Glucagon (BAQSIMI ONE PACK) 3 MG/DOSE POWD Place 1 spray into the nose as needed (for low BGL). 02/07/21   [provider]  hydrALAZINE  (APRESOLINE ) 50 MG tablet Take 1 tablet (50 mg total) by mouth 3 (three) times daily. 03/28/24 04/27/24  Perri DELENA Meliton Mickey., MD  insulin  aspart (NOVOLOG ) 100 UNIT/ML FlexPen Inject 120 Units into the skin every 3 (three) days. Via insulin  pump - 120 units every three days. 01/30/17   [provider]  insulin  degludec (TRESIBA) 100 UNIT/ML FlexTouch Pen Inject 26 Units into the skin daily. Use if insulin  pump malfunctions. 05/23/22   [provider]  Insulin   Disposable Pump (OMNIPOD 5 G6 PODS, GEN 5,) MISC Inject 1 Device into the skin every 3 (three) days.    [provider]  metoCLOPramide  (REGLAN ) 5 MG tablet Take 1 tablet (5 mg total) by mouth every 6 (six) hours as needed for nausea or vomiting. 03/28/24 04/27/24  Perri DELENA Meliton Mickey., MD  ondansetron  (ZOFRAN -ODT) 4 MG disintegrating tablet Take 1 tablet (4 mg total) by mouth every 8 (eight) hours as needed for nausea or vomiting. Patient taking differently: Take 4 mg by mouth as needed for nausea or vomiting. 01/10/24   Tegeler, Lonni PARAS, MD  pantoprazole  (PROTONIX ) 40 MG tablet Take 1 tablet (40 mg total) by mouth daily. Patient taking differently: Take 40 mg by mouth as needed (heartburn). 04/30/22   Regalado, Belkys A, MD  torsemide (DEMADEX) 10 MG tablet Take 10 mg by mouth as needed (swelling). 12/27/22   [provider]  potassium chloride  SA (KLOR-CON ) 20 MEQ tablet Take 1 tablet (20 mEq total) by mouth daily. 03/02/20 08/05/20  Petrucelli, Lucie SAUNDERS, PA-C    Physical Exam: Vitals:   06/22/24 0030 06/22/24 0045 06/22/24 0100 06/22/24 0225  BP: (!) 178/116 (!) 174/100 (!) 143/92 (!) 143/92  Pulse: (!) 117 (!) 115 (!) 110 (!) 113  Resp:    16  Temp:    97.7 F (36.5 C)  SpO2: 97% 99% 99% 99%  Weight:      Height:        Constitutional: NAD, calm  Eyes: PERTLA, lids and conjunctivae normal ENMT: Mucous membranes are moist. Posterior pharynx clear of any exudate or lesions.   Neck: supple, no masses  Respiratory: no wheezing, no crackles. No accessory muscle use.  Cardiovascular: Rate ~120 and regular. No extremity edema.   Abdomen: No distension, no tenderness, soft. Bowel sounds active.  Musculoskeletal: no clubbing / cyanosis. No joint deformity upper and lower extremities.   Skin: no significant rashes, lesions, ulcers. Warm, dry, well-perfused. Neurologic: No gross facial asymmetry, no aphasia or dysarthria. Moving all extremities. Alert and oriented.   Psychiatric: Pleasant. Cooperative.    Labs and Imaging on Admission: I have personally reviewed following labs and imaging studies  CBC: Recent Labs  Lab 06/21/24 2152 06/22/24 0021  WBC 13.8*  --   NEUTROABS 12.4*  --   HGB 12.7* 13.6  HCT 38.8* 40.0  MCV 74.8*  --   PLT 251  --    Basic Metabolic Panel: Recent Labs  Lab 06/21/24 2152 06/22/24 0010 06/22/24 0021  NA 144 143 141  K 4.4 4.4 4.2  CL 98 98  --   CO2 18* 17*  --   GLUCOSE 436* 491*  --   BUN 28* 32*  --   CREATININE 3.03* 3.28*  --   CALCIUM 10.0 9.6  --    GFR: Estimated Creatinine Clearance: 28.5 mL/min (A) (by C-G formula based on SCr of 3.28 mg/dL (H)). Liver Function Tests: Recent Labs  Lab 06/21/24 2152  AST  36  ALT 64*  ALKPHOS 138*  BILITOT 0.7  PROT 8.1  ALBUMIN 4.6   Recent Labs  Lab 06/22/24 0010  LIPASE 29   No results for input(s): AMMONIA in the last 168 hours. Coagulation Profile: No results for input(s): INR, PROTIME in the last 168 hours. Cardiac Enzymes: No results for input(s): CKTOTAL, CKMB, CKMBINDEX, TROPONINI in the last 168 hours. BNP (last 3 results) No results for input(s): PROBNP in the last 8760 hours. HbA1C: No results for input(s): HGBA1C in the last 72 hours. CBG: Recent Labs  Lab 06/21/24 2141 06/22/24 0038 06/22/24 0127 06/22/24 0218  GLUCAP 450* 441* 408* 317*   Lipid Profile: No results for input(s): CHOL, HDL, LDLCALC, TRIG, CHOLHDL, LDLDIRECT in the last 72 hours. Thyroid Function Tests: No results for input(s): TSH, T4TOTAL, FREET4, T3FREE, THYROIDAB in the last 72 hours. Anemia Panel: No results for input(s): VITAMINB12, FOLATE, FERRITIN, TIBC, IRON, RETICCTPCT in the last 72 hours. Urine analysis:    Component Value Date/Time   COLORURINE YELLOW 06/22/2024 0221   APPEARANCEUR CLEAR 06/22/2024 0221   LABSPEC 1.015 06/22/2024 0221   PHURINE 5.5 06/22/2024 0221   GLUCOSEU >=500 (A)  06/22/2024 0221   HGBUR MODERATE (A) 06/22/2024 0221   BILIRUBINUR NEGATIVE 06/22/2024 0221   KETONESUR 15 (A) 06/22/2024 0221   PROTEINUR >=300 (A) 06/22/2024 0221   NITRITE NEGATIVE 06/22/2024 0221   LEUKOCYTESUR NEGATIVE 06/22/2024 0221   Sepsis Labs: @LABRCNTIP (procalcitonin:4,lacticidven:4) )No results found for this or any previous visit (from the past 240 hours).   Radiological Exams on Admission: No results found.  EKG: Independently reviewed. Sinus tachycardia, rate 127.   Assessment/Plan   1. DKA  - A1c was 10.0% in June 2025 - Pt reports that his insulin  pump was not delivering insulin  for a couple hours prior to symptom-onset  - Continue IVF hydration and insulin  infusion with frequent CBGs and serial chem panels until DKA resolves    2. Abdominal pain; N/V  - Pain and nausea have resolved by time of admission and pt asking to eat    3. CKD IV  - Appears close to baseline  - Renally-dose medications, monitor    4. Hypertensive urgency  - BP severely elevated in ED without target-organ damage  - Continue home antihypertensive regimen, use IV labetalol  if needed     DVT prophylaxis: sq heparin   Code Status: Full  Level of Care: Level of care: Stepdown Family Communication: None present  Disposition Plan:  Patient is from: home  Anticipated d/c is to: Home  Anticipated d/c date is: 06/23/24  Patient currently: Pending glycemic-control  Consults called: None  Admission status: Observation     Evalene GORMAN Sprinkles, MD [facility]  06/22/2024, 3:34 AM

## 2024-06-23 ENCOUNTER — Telehealth (HOSPITAL_COMMUNITY): Payer: Self-pay | Admitting: Pharmacy Technician

## 2024-06-23 ENCOUNTER — Other Ambulatory Visit (HOSPITAL_COMMUNITY): Payer: Self-pay

## 2024-06-23 DIAGNOSIS — E1010 Type 1 diabetes mellitus with ketoacidosis without coma: Secondary | ICD-10-CM | POA: Diagnosis not present

## 2024-06-23 LAB — BASIC METABOLIC PANEL WITH GFR
Anion gap: 11 (ref 5–15)
BUN: 22 mg/dL — ABNORMAL HIGH (ref 6–20)
CO2: 24 mmol/L (ref 22–32)
Calcium: 8.8 mg/dL — ABNORMAL LOW (ref 8.9–10.3)
Chloride: 105 mmol/L (ref 98–111)
Creatinine, Ser: 2.79 mg/dL — ABNORMAL HIGH (ref 0.61–1.24)
GFR, Estimated: 31 mL/min — ABNORMAL LOW (ref >60)
Glucose, Bld: 158 mg/dL — ABNORMAL HIGH (ref 70–99)
Potassium: 3.7 mmol/L (ref 3.5–5.1)
Sodium: 140 mmol/L (ref 135–145)

## 2024-06-23 LAB — GLUCOSE, CAPILLARY
Glucose-Capillary: 142 mg/dL — ABNORMAL HIGH (ref 70–99)
Glucose-Capillary: 143 mg/dL — ABNORMAL HIGH (ref 70–99)
Glucose-Capillary: 144 mg/dL — ABNORMAL HIGH (ref 70–99)
Glucose-Capillary: 145 mg/dL — ABNORMAL HIGH (ref 70–99)
Glucose-Capillary: 155 mg/dL — ABNORMAL HIGH (ref 70–99)
Glucose-Capillary: 155 mg/dL — ABNORMAL HIGH (ref 70–99)
Glucose-Capillary: 160 mg/dL — ABNORMAL HIGH (ref 70–99)
Glucose-Capillary: 162 mg/dL — ABNORMAL HIGH (ref 70–99)
Glucose-Capillary: 163 mg/dL — ABNORMAL HIGH (ref 70–99)
Glucose-Capillary: 165 mg/dL — ABNORMAL HIGH (ref 70–99)
Glucose-Capillary: 166 mg/dL — ABNORMAL HIGH (ref 70–99)
Glucose-Capillary: 166 mg/dL — ABNORMAL HIGH (ref 70–99)
Glucose-Capillary: 172 mg/dL — ABNORMAL HIGH (ref 70–99)
Glucose-Capillary: 174 mg/dL — ABNORMAL HIGH (ref 70–99)
Glucose-Capillary: 190 mg/dL — ABNORMAL HIGH (ref 70–99)
Glucose-Capillary: 206 mg/dL — ABNORMAL HIGH (ref 70–99)
Glucose-Capillary: 216 mg/dL — ABNORMAL HIGH (ref 70–99)
Glucose-Capillary: 218 mg/dL — ABNORMAL HIGH (ref 70–99)
Glucose-Capillary: 285 mg/dL — ABNORMAL HIGH (ref 70–99)

## 2024-06-23 LAB — CBC WITH DIFFERENTIAL/PLATELET
Abs Immature Granulocytes: 0.08 K/uL — ABNORMAL HIGH (ref 0.00–0.07)
Basophils Absolute: 0 K/uL (ref 0.0–0.1)
Basophils Relative: 0 %
Eosinophils Absolute: 0 K/uL (ref 0.0–0.5)
Eosinophils Relative: 0 %
HCT: 38.2 % — ABNORMAL LOW (ref 39.0–52.0)
Hemoglobin: 12.1 g/dL — ABNORMAL LOW (ref 13.0–17.0)
Immature Granulocytes: 1 %
Lymphocytes Relative: 9 %
Lymphs Abs: 1.4 K/uL (ref 0.7–4.0)
MCH: 24.9 pg — ABNORMAL LOW (ref 26.0–34.0)
MCHC: 31.7 g/dL (ref 30.0–36.0)
MCV: 78.8 fL — ABNORMAL LOW (ref 80.0–100.0)
Monocytes Absolute: 1.4 K/uL — ABNORMAL HIGH (ref 0.1–1.0)
Monocytes Relative: 9 %
Neutro Abs: 12.6 K/uL — ABNORMAL HIGH (ref 1.7–7.7)
Neutrophils Relative %: 81 %
Platelets: 197 K/uL (ref 150–400)
RBC: 4.85 MIL/uL (ref 4.22–5.81)
RDW: 14.7 % (ref 11.5–15.5)
WBC: 15.6 K/uL — ABNORMAL HIGH (ref 4.0–10.5)
nRBC: 0 % (ref 0.0–0.2)

## 2024-06-23 LAB — BETA-HYDROXYBUTYRIC ACID: Beta-Hydroxybutyric Acid: 0.99 mmol/L — ABNORMAL HIGH (ref 0.05–0.27)

## 2024-06-23 MED ORDER — METOCLOPRAMIDE HCL 5 MG/ML IJ SOLN
10.0000 mg | Freq: Three times a day (TID) | INTRAMUSCULAR | Status: DC
  Administered 2024-06-23 – 2024-06-24 (×3): 10 mg via INTRAVENOUS
  Filled 2024-06-23 (×3): qty 2

## 2024-06-23 MED ORDER — HYDRALAZINE HCL 50 MG PO TABS
100.0000 mg | ORAL_TABLET | Freq: Three times a day (TID) | ORAL | Status: DC
  Administered 2024-06-23 – 2024-06-24 (×3): 100 mg via ORAL
  Filled 2024-06-23 (×3): qty 2

## 2024-06-23 MED ORDER — DEXTROSE IN LACTATED RINGERS 5 % IV SOLN: INTRAVENOUS | Status: AC

## 2024-06-23 MED ORDER — PANTOPRAZOLE SODIUM 40 MG IV SOLR
40.0000 mg | Freq: Every day | INTRAVENOUS | Status: DC
  Administered 2024-06-23 – 2024-06-24 (×2): 40 mg via INTRAVENOUS
  Filled 2024-06-23 (×2): qty 10

## 2024-06-23 NOTE — Progress Notes (Addendum)
 PROGRESS NOTE  [facility]  DOB: 1996-09-12  PCP: Freddrick Johns FMW:[id number]  DOA: 06/21/2024  LOS: 1 day  Hospital Day: 3  Brief narrative: Shervin Cypert is a 28 y.o. male with PMH significant for HTN, DM1 since the age of 78, gastroparesis, retinopathy, CKD 4 7/12, patient presented to ED at [facility] with complaint of nausea, vomiting, abdominal pain for several hours.  Patient uses insulin  pump and apparently it stopped delivering insulin  for couple of hours leading to symptoms that progressed and hence presented to the ED  In the ED, patient was afebrile, heart rate in 120s, blood pressure was significantly elevated up to 210/120. Initial blood sugar level was over 450. VBG with pH 7.4, bicarb 20 BMP with creatinine elevated to 3.03, lactic acid elevated 2.2, WBC 13.8, hemoglobin 12.7, serum ketones elevated to 5.1, anion gap elevated to 28 Given persistent hyperglycemia, patient was started on insulin  drip, IV hydration, IV antibiotics, IV analgesics Accepted to admission at [facility].  Subjective: Patient was seen and examined this morning.   Still nauseous.  Threw up this morning.  Does not want to start eating yet.   Mother at bedside.  Remains on insulin  drip.  Assessment and plan: Early DKA Metabolic acidosis Lactic acidosis Presented with nausea, vomiting, abdominal pain after insulin  pump malfunction Noted to have elevated blood sugar, elevated serum ketones, elevated anion gap and downtrending serum bicarb Started on DKA protocol with insulin  infusion, IV fluid, electrolyte monitoring Subsequent lab work showed anion gap closed, blood sugar improved. Patient however remains nauseous and does not feel like he can try oral intake. I will continue insulin  drip for now.  Type 1 diabetes mellitus Diabetic gastroparesis A1c 10 in 05/29/2024  PTA meds-insulin  pump, apparently ran out of insulin  pod which triggered the cascade Still remains  nauseous. Given history of gastroparesis, I will schedule Reglan  10 mg 3 times daily IV for next 2 days. Continue IV Protonix . Once his symptoms improve, we can plan to transition him to insulin  pump.  Mom at bedside states she will bring the pump from home.   Diabetes care coordinator consult appreciated. Recent Labs  Lab 06/23/24 0238 06/23/24 0321 06/23/24 0527 06/23/24 0728 06/23/24 0943  GLUCAP 155* 165* 145* 216* 144*   Hypertensive urgency Initial blood pressure was elevated over 200 PTA meds-carvedilol  25 mg twice daily, amlodipine  10 mg daily, hydralazine  50 mg 3 times daily Currently continued on all.  Despite that, blood pressure remained significantly elevated overnight.  I will increase hydralazine  to 100 mg 3 times daily IV labetalol  as needed  CKD 4 Creatinine remains at baseline. Continue monitor  Recent Labs    03/27/24 0259 03/28/24 0305 06/21/24 2152 06/22/24 0010 06/22/24 0346 06/22/24 0736 06/22/24 1041 06/22/24 1546 06/22/24 1935 06/23/24 0322  BUN 34* 36* 28* 32* 33* 33* 32* 29* 25* 22*  CREATININE 3.18* 3.25* 3.03* 3.28* 3.22* 3.28* 3.39* 3.06* 3.06* 2.79*  CO2 23 21* 18* 17* 21* 23 24 24 22 24    Leukocytosis WBC count and lactic acid level were elevated secondary to stress from DKA. No evidence of infection at this time Repeat labs tomorrow Recent Labs  Lab 06/21/24 2152 06/22/24 0010 06/22/24 0346 06/23/24 0322  WBC 13.8*  --   --  15.6*  LATICACIDVEN  --  2.2* 2.3*  --     Mobility: Encourage ambulation  Goals of care   Code Status: Full Code     DVT prophylaxis:  heparin  injection 5,000  Units Start: 06/22/24 0600   Antimicrobials: None Fluid: Currently on D5 LR per protocol Consultants: Diabetes care coordinator Family Communication: Mother at bedside  Status: Inpatient Level of care:  Stepdown   Patient is from: Home Needs to continue in-hospital care: still remains nauseous, unable to eat, remains on insulin   drip Anticipated d/c to: Hopefully home in 1 to 2 days    Diet:  Diet Order             Diet clear liquid Room service appropriate? Yes; Fluid consistency: Thin  Diet effective now                   Scheduled Meds:  amLODipine   10 mg Oral Daily   carvedilol   25 mg Oral BID WC   Chlorhexidine  Gluconate Cloth  6 each Topical Daily   heparin   5,000 Units Subcutaneous Q8H   hydrALAZINE   100 mg Oral TID   metoCLOPramide  (REGLAN ) injection  10 mg Intravenous Q8H   pantoprazole  (PROTONIX ) IV  40 mg Intravenous QAC breakfast    PRN meds: acetaminophen , dextrose , fentaNYL  (SUBLIMAZE ) injection, labetalol , oxyCODONE , prochlorperazine    Infusions:   dextrose  5% lactated ringers  125 mL/hr at 06/23/24 0800   insulin  2.6 Units/hr (06/23/24 0800)    Antimicrobials: Anti-infectives (From admission, onward)    None       Objective: Vitals:   06/23/24 0600 06/23/24 0700  BP: 139/87 (!) 176/112  Pulse: 89 93  Resp: 14 19  Temp:    SpO2: 96% 98%    Intake/Output Summary (Last 24 hours) at 06/23/2024 0945 Last data filed at 06/23/2024 0800 Gross per 24 hour  Intake 2912.07 ml  Output --  Net 2912.07 ml   Filed Weights   06/21/24 2141  Weight: 59 kg   Weight change:  Body mass index is 20.98 kg/m.   Physical Exam: General exam: Pleasant, young African-American male.  In distress from persistent nausea Skin: No rashes, lesions or ulcers. HEENT: Atraumatic, normocephalic, no obvious bleeding Lungs: Clear to auscultation bilaterally,  CVS: S1, S2, no murmur,   GI/Abd: Soft, mild epigastric eminence, nondistended, bowel sound present,   CNS: Alert, awake, oriented x 3 Psychiatry: Sad affect Extremities: No pedal edema, no calf tenderness,   Data Review: I have personally reviewed the laboratory data and studies available.  F/u labs ordered Unresulted Labs (From admission, onward)     Start     Ordered   06/24/24 0500  Basic metabolic panel with GFR  Tomorrow  morning,   R       Question:  Specimen collection method  Answer:  Lab=Lab collect   06/23/24 0945   06/24/24 0500  CBC with Differential/Platelet  Tomorrow morning,   R       Question:  Specimen collection method  Answer:  Lab=Lab collect   06/23/24 0945   06/24/24 0500  Lactic acid, plasma  (Lactic Acid)  Tomorrow morning,   R       Question:  Specimen collection method  Answer:  Lab=Lab collect   06/23/24 0945            Signed, Chapman Rota, MD [facility] 06/23/2024

## 2024-06-23 NOTE — Plan of Care (Signed)

## 2024-06-23 NOTE — Inpatient Diabetes Management (Signed)
 Inpatient Diabetes Program Recommendations  AACE/ADA: New Consensus Statement on Inpatient Glycemic Control (2015)  Target Ranges:  Prepandial:   less than 140 mg/dL      Peak postprandial:   less than 180 mg/dL (1-2 hours)      Critically ill patients:  140 - 180 mg/dL   Lab Results  Component Value Date   GLUCAP 216 (H) 06/23/2024   HGBA1C 9.9 (H) 01/10/2024    Review of Glycemic Control  Latest Reference Range & Units 06/23/24 01:26 06/23/24 02:38 06/23/24 03:21 06/23/24 05:27 06/23/24 07:28  Glucose-Capillary 70 - 99 mg/dL 837 (H) 844 (H) 834 (H) 145 (H) 216 (H)  (H): Data is abnormally high  Latest Reference Range & Units 06/23/24 03:22  CO2 22 - 32 mmol/L 24    Latest Reference Range & Units 06/23/24 03:22  Beta-Hydroxybutyric Acid 0.05 - 0.27 mmol/L 0.99 (H)  (H): Data is abnormally high  Diabetes history: DM1  Outpatient Diabetes medications:  Omnipod with the Dexcom 6 CGM Basal: 0.6 (total 14.4) Blood glucose target: 150 Insulin  carb ratio: 17 Insulin  sensitivity factor: 60  Has back up basal insulin -Tresiba 12 units QD  Current orders for Inpatient glycemic control:  IV insulin   Inpatient Diabetes Program Recommendations:    When MD is ready to transition to SQ insulin , please consider transitioning back to insulin  pump 1 hour prior to discontinuing IV insulin .  Met with [facility] and [facility] at bedside.  He is current with [facility] with Endocrinology at [facility].  Last visit was 05/29/24.  A1C was 10% at that visit.  She decreased his target BG at that tine from 150 mg/dL to 859 mg/dL.  He is nearly blind and has gastroparesis.  [facility] states his Omnipod ran out of Novolog  and there was no one home to help him applies a new pod.  He was without insulin  for a couple of hours and became hyperglycemia and having symptoms of DKA.  When his [facility] Graham got home she put a new pod on him but at that point it was too late.  He lives with [facility] and is unable to apply his pump himself due  to vision.  He has back up [place] 12 units every day if needed ordered but [facility] Graham says insurance would not fill.  I will ask our [facility] pharmacy for a benefit check on basal insulin .  He has insulin  syringes and Novolog  but if alone would not be able to draw rapid insulin  up due to vision.  Advised [facility] to draw up a couple of insulin  syringes with 2, 3 and 5 units and label with a large pice of tape or something so he can see/read it.  If his pump fails or runs out of insulin  while she is not home he can inject insulin  if he is hyperglycemic based on BG.  This could possibly keep him from going into DKA.  Anthony Graham agrees with this plan.    [facility] Graham admits to T1DM being very difficult to deal with and he is tearful.  Offered emotional support and encouragement with his DM management.    Thank you, Anthony Pinal, MSN, CDCES Diabetes Coordinator Inpatient Diabetes Program [phone] (team pager from 8a-5p)

## 2024-06-23 NOTE — Telephone Encounter (Addendum)
 Patient Product/process development scientist completed.    The patient is insured through [employer]. Patient has Medicare and is not eligible for a copay card, but may be able to apply for patient assistance or Medicare RX Payment Plan (Patient Must reach out to their plan, if eligible for payment plan), if available.    Ran test claim for insulin  glargine-yfgn 100 unit Pen (Semglee ) and the current 30 day co-pay is $1.60.  Ran test claim for insulin  Degludec FlexTounch 100 unit/ml Pen Lelon) and the current 30 day co-pay is $1.60.  Ran test claim for Semglee  Pen and Not on Formulary  This test claim was processed through [facility]- copay amounts may vary at other pharmacies due to [employer], or as the patient moves through the different stages of their insurance plan.     Reyes Sharps, CPHT Pharmacy Technician III Certified Patient Advocate [facility] Patient Advocate Team Direct Number: [phone]  Fax: [phone]

## 2024-06-24 DIAGNOSIS — E1010 Type 1 diabetes mellitus with ketoacidosis without coma: Secondary | ICD-10-CM | POA: Diagnosis not present

## 2024-06-24 LAB — CBC WITH DIFFERENTIAL/PLATELET
Abs Immature Granulocytes: 0.02 K/uL (ref 0.00–0.07)
Basophils Absolute: 0 K/uL (ref 0.0–0.1)
Basophils Relative: 0 %
Eosinophils Absolute: 0.1 K/uL (ref 0.0–0.5)
Eosinophils Relative: 1 %
HCT: 36 % — ABNORMAL LOW (ref 39.0–52.0)
Hemoglobin: 11 g/dL — ABNORMAL LOW (ref 13.0–17.0)
Immature Granulocytes: 0 %
Lymphocytes Relative: 21 %
Lymphs Abs: 1.6 K/uL (ref 0.7–4.0)
MCH: 24.4 pg — ABNORMAL LOW (ref 26.0–34.0)
MCHC: 30.6 g/dL (ref 30.0–36.0)
MCV: 79.8 fL — ABNORMAL LOW (ref 80.0–100.0)
Monocytes Absolute: 1.1 K/uL — ABNORMAL HIGH (ref 0.1–1.0)
Monocytes Relative: 14 %
Neutro Abs: 4.8 K/uL (ref 1.7–7.7)
Neutrophils Relative %: 64 %
Platelets: 143 K/uL — ABNORMAL LOW (ref 150–400)
RBC: 4.51 MIL/uL (ref 4.22–5.81)
RDW: 14.8 % (ref 11.5–15.5)
WBC: 7.6 K/uL (ref 4.0–10.5)
nRBC: 0 % (ref 0.0–0.2)

## 2024-06-24 LAB — BASIC METABOLIC PANEL WITH GFR
Anion gap: 10 (ref 5–15)
Anion gap: 4 — ABNORMAL LOW (ref 5–15)
BUN: 16 mg/dL (ref 6–20)
BUN: 17 mg/dL (ref 6–20)
CO2: 22 mmol/L (ref 22–32)
CO2: 26 mmol/L (ref 22–32)
Calcium: 8 mg/dL — ABNORMAL LOW (ref 8.9–10.3)
Calcium: 7.8 mg/dL — ABNORMAL LOW (ref 8.9–10.3)
Chloride: 102 mmol/L (ref 98–111)
Chloride: 105 mmol/L (ref 98–111)
Creatinine, Ser: 2.85 mg/dL — ABNORMAL HIGH (ref 0.61–1.24)
Creatinine, Ser: 2.73 mg/dL — ABNORMAL HIGH (ref 0.61–1.24)
GFR, Estimated: 30 mL/min — ABNORMAL LOW (ref >60)
GFR, Estimated: 32 mL/min — ABNORMAL LOW (ref >60)
Glucose, Bld: 161 mg/dL — ABNORMAL HIGH (ref 70–99)
Glucose, Bld: 175 mg/dL — ABNORMAL HIGH (ref 70–99)
Potassium: 3.2 mmol/L — ABNORMAL LOW (ref 3.5–5.1)
Potassium: 3 mmol/L — ABNORMAL LOW (ref 3.5–5.1)
Sodium: 134 mmol/L — ABNORMAL LOW (ref 135–145)
Sodium: 135 mmol/L (ref 135–145)

## 2024-06-24 LAB — GLUCOSE, CAPILLARY
Glucose-Capillary: 148 mg/dL — ABNORMAL HIGH (ref 70–99)
Glucose-Capillary: 152 mg/dL — ABNORMAL HIGH (ref 70–99)
Glucose-Capillary: 183 mg/dL — ABNORMAL HIGH (ref 70–99)
Glucose-Capillary: 166 mg/dL — ABNORMAL HIGH (ref 70–99)
Glucose-Capillary: 176 mg/dL — ABNORMAL HIGH (ref 70–99)
Glucose-Capillary: 163 mg/dL — ABNORMAL HIGH (ref 70–99)
Glucose-Capillary: 188 mg/dL — ABNORMAL HIGH (ref 70–99)
Glucose-Capillary: 265 mg/dL — ABNORMAL HIGH (ref 70–99)

## 2024-06-24 LAB — PHOSPHORUS: Phosphorus: 2.7 mg/dL (ref 2.5–4.6)

## 2024-06-24 LAB — BETA-HYDROXYBUTYRIC ACID: Beta-Hydroxybutyric Acid: 0.11 mmol/L (ref 0.05–0.27)

## 2024-06-24 LAB — LACTIC ACID, PLASMA: Lactic Acid, Venous: 0.7 mmol/L (ref 0.5–1.9)

## 2024-06-24 LAB — MAGNESIUM: Magnesium: 1.7 mg/dL (ref 1.7–2.4)

## 2024-06-24 MED ORDER — POTASSIUM CHLORIDE CRYS ER 20 MEQ PO TBCR
40.0000 meq | EXTENDED_RELEASE_TABLET | Freq: Once | ORAL | Status: AC
  Administered 2024-06-24: 40 meq via ORAL
  Filled 2024-06-24: qty 2

## 2024-06-24 MED ORDER — POTASSIUM CHLORIDE CRYS ER 20 MEQ PO TBCR
40.0000 meq | EXTENDED_RELEASE_TABLET | ORAL | Status: AC
  Administered 2024-06-24 (×2): 40 meq via ORAL
  Filled 2024-06-24 (×2): qty 2

## 2024-06-24 MED ORDER — HYDRALAZINE HCL 100 MG PO TABS: 100.0000 mg | ORAL_TABLET | Freq: Three times a day (TID) | ORAL | 0 refills | Status: DC

## 2024-06-24 MED ORDER — INSULIN PUMP
Freq: Three times a day (TID) | SUBCUTANEOUS | Status: DC
  Administered 2024-06-24: 5.65 via SUBCUTANEOUS
  Filled 2024-06-24: qty 1

## 2024-06-24 NOTE — Progress Notes (Signed)
   06/24/24 1222  TOC Brief Assessment  Insurance and Status Reviewed  Patient has primary care physician Yes  Home environment has been reviewed Apartment  Prior level of function: Independent  Prior/Current Home Services No current home services  Social Drivers of Health Review SDOH reviewed no interventions necessary  Readmission risk has been reviewed Yes  Transition of care needs no transition of care needs at this time

## 2024-06-24 NOTE — Inpatient Diabetes Management (Incomplete)
 Inpatient Diabetes Program Recommendations  AACE/ADA: New Consensus Statement on Inpatient Glycemic Control (2015)  Target Ranges:  Prepandial:   less than 140 mg/dL      Peak postprandial:   less than 180 mg/dL (1-2 hours)      Critically ill patients:  140 - 180 mg/dL   Lab Results  Component Value Date   GLUCAP 188 (H) 06/24/2024   HGBA1C 9.9 (H) 01/10/2024    Review of Glycemic Control  Latest Reference Range & Units 06/24/24 05:58  CO2 22 - 32 mmol/L 26    Latest Reference Range & Units 06/24/24 05:58  Beta-Hydroxybutyric Acid 0.05 - 0.27 mmol/L 0.11   Diabetes history: DM1   Outpatient Diabetes medications:  Omnipod with the Dexcom 6 CGM Basal: 0.6 (total 14.4) Blood glucose target: 150 Insulin  carb ratio: 17 Insulin  sensitivity factor: 60  Has back up basal insulin -Tresiba 12 units QD   Current orders for Inpatient glycemic control:  IV insulin   Inpatient Diabetes Program Recommendations:

## 2024-06-24 NOTE — Plan of Care (Signed)
 Problem: Education: Goal: Knowledge of General Education information will improve Description: Including pain rating scale, medication(s)/side effects and non-pharmacologic comfort measures Outcome: Adequate for Discharge   Problem: Health Behavior/Discharge Planning: Goal: Ability to manage health-related needs will improve Outcome: Adequate for Discharge   Problem: Clinical Measurements: Goal: Ability to maintain clinical measurements within normal limits will improve Outcome: Adequate for Discharge Goal: Will remain free from infection Outcome: Adequate for Discharge Goal: Diagnostic test results will improve Outcome: Adequate for Discharge Goal: Respiratory complications will improve Outcome: Adequate for Discharge Goal: Cardiovascular complication will be avoided Outcome: Adequate for Discharge   Problem: Activity: Goal: Risk for activity intolerance will decrease Outcome: Adequate for Discharge   Problem: Nutrition: Goal: Adequate nutrition will be maintained Outcome: Adequate for Discharge   Problem: Coping: Goal: Level of anxiety will decrease Outcome: Adequate for Discharge   Problem: Elimination: Goal: Will not experience complications related to bowel motility Outcome: Adequate for Discharge Goal: Will not experience complications related to urinary retention Outcome: Adequate for Discharge   Problem: Pain Managment: Goal: General experience of comfort will improve and/or be controlled Outcome: Adequate for Discharge   Problem: Safety: Goal: Ability to remain free from injury will improve Outcome: Adequate for Discharge   Problem: Skin Integrity: Goal: Risk for impaired skin integrity will decrease Outcome: Adequate for Discharge   Problem: Education: Goal: Ability to describe self-care measures that may prevent or decrease complications (Diabetes Survival Skills Education) will improve Outcome: Adequate for Discharge Goal: Individualized Educational  Video(s) Outcome: Adequate for Discharge   Problem: Coping: Goal: Ability to adjust to condition or change in health will improve Outcome: Adequate for Discharge   Problem: Fluid Volume: Goal: Ability to maintain a balanced intake and output will improve Outcome: Adequate for Discharge   Problem: Health Behavior/Discharge Planning: Goal: Ability to identify and utilize available resources and services will improve Outcome: Adequate for Discharge Goal: Ability to manage health-related needs will improve Outcome: Adequate for Discharge   Problem: Metabolic: Goal: Ability to maintain appropriate glucose levels will improve Outcome: Adequate for Discharge   Problem: Nutritional: Goal: Maintenance of adequate nutrition will improve Outcome: Adequate for Discharge Goal: Progress toward achieving an optimal weight will improve Outcome: Adequate for Discharge   Problem: Skin Integrity: Goal: Risk for impaired skin integrity will decrease Outcome: Adequate for Discharge   Problem: Tissue Perfusion: Goal: Adequacy of tissue perfusion will improve Outcome: Adequate for Discharge   Problem: Education: Goal: Ability to describe self-care measures that may prevent or decrease complications (Diabetes Survival Skills Education) will improve Outcome: Adequate for Discharge Goal: Individualized Educational Video(s) Outcome: Adequate for Discharge   Problem: Cardiac: Goal: Ability to maintain an adequate cardiac output will improve Outcome: Adequate for Discharge   Problem: Health Behavior/Discharge Planning: Goal: Ability to identify and utilize available resources and services will improve Outcome: Adequate for Discharge Goal: Ability to manage health-related needs will improve Outcome: Adequate for Discharge   Problem: Fluid Volume: Goal: Ability to achieve a balanced intake and output will improve Outcome: Adequate for Discharge   Problem: Metabolic: Goal: Ability to maintain  appropriate glucose levels will improve Outcome: Adequate for Discharge   Problem: Nutritional: Goal: Maintenance of adequate nutrition will improve Outcome: Adequate for Discharge Goal: Maintenance of adequate weight for body size and type will improve Outcome: Adequate for Discharge   Problem: Respiratory: Goal: Will regain and/or maintain adequate ventilation Outcome: Adequate for Discharge   Problem: Urinary Elimination: Goal: Ability to achieve and maintain adequate renal perfusion and  functioning will improve Outcome: Adequate for Discharge

## 2024-06-24 NOTE — Discharge Summary (Signed)
 Physician Discharge Summary  Marcia Lepera FMW:[id number] DOB: Sep 23, 1996 DOA: 06/21/2024  PCP: Pcp, No  Admit date: 06/21/2024 Discharge date: 06/24/2024  Admitted From: Home Discharge disposition: Home  Recommendations at discharge:  Recommend compliance to insulin  pump, blood sugar monitoring For blood pressure control -continue carvedilol  and amlodipine  as before.  Hydralazine  dose has been increased to 100 mg 3 times daily.  Continue to monitor blood pressure at home. Continue Reglan  PRN at home for nausea.   Brief narrative: Anthony Graham is a 28 y.o. male with PMH significant for HTN, DM1 since the age of 70, gastroparesis, retinopathy, CKD 4 7/12, patient presented to ED at [facility] with complaint of nausea, vomiting, abdominal pain for several hours.  Patient uses insulin  pump and apparently it stopped delivering insulin  for couple of hours leading to symptoms that progressed and hence presented to the ED  In the ED, patient was afebrile, heart rate in 120s, blood pressure was significantly elevated up to 210/120. Initial blood sugar level was over 450. VBG with pH 7.4, bicarb 20 BMP with creatinine elevated to 3.03, lactic acid elevated 2.2, WBC 13.8, hemoglobin 12.7, serum ketones elevated to 5.1, anion gap elevated to 28 Given persistent hyperglycemia, patient was started on insulin  drip, IV hydration, IV antibiotics, IV analgesics Accepted to admission at [facility].  Subjective: Patient was seen and examined this morning. Lying on bed.  Nausea much better with IV Reglan  scheduled.  Sipping on clear liquid.  Wants to try soft food.  Mother at bedside. Transitioned from insulin  drip to insulin  pump this morning. Remains hemodynamically stable Labs from this morning with potassium low at 3, replacement given  Hospital course: Early DKA Metabolic acidosis Lactic acidosis Presented with nausea, vomiting, abdominal pain after insulin  pump  malfunction Noted to have elevated blood sugar, elevated serum ketones, elevated anion gap and downtrending serum bicarb Started on DKA protocol with insulin  infusion, IV fluid, electrolyte monitoring Subsequent lab work showed anion gap closed, blood sugar improved. Patient however remained nauseous and hence insulin  drip was continued for next 40 hours.   Feels much better today with IV Reglan  and wants to try soft food.   Transitioned from insulin  drip to insulin  pump this morning. To continue Reglan  as needed at home.  Type 1 diabetes mellitus Diabetic gastroparesis A1c 10 in 05/29/2024  PTA meds-insulin  pump, apparently ran out of insulin  pod which triggered the cascade Initially required insulin  drip.  Back on pump again as above Diabetes care coordinator consult appreciated. Recent Labs  Lab 06/24/24 0549 06/24/24 0646 06/24/24 0744 06/24/24 0855 06/24/24 1151  GLUCAP 166* 176* 163* 188* 265*   Hypertensive urgency Initial blood pressure was elevated over 200 PTA meds-carvedilol  25 mg twice daily, amlodipine  10 mg daily, hydralazine  50 mg 3 times daily. Currently continued on carvedilol  and amlodipine  at the same dose.  Hydralazine  dose was increased to 100 mg 3 times daily. Patient to continue to monitor blood pressure at home.  CKD 4 Creatinine remains at baseline. Continue monitor  Recent Labs    06/21/24 2152 06/22/24 0010 06/22/24 0346 06/22/24 0736 06/22/24 1041 06/22/24 1546 06/22/24 1935 06/23/24 0322 06/24/24 0256 06/24/24 0558  BUN 28* 32* 33* 33* 32* 29* 25* 22* 16 17  CREATININE 3.03* 3.28* 3.22* 3.28* 3.39* 3.06* 3.06* 2.79* 2.85* 2.73*  CO2 18* 17* 21* 23 24 24 22 24 22 26    Leukocytosis WBC count and lactic acid level were elevated secondary to stress from DKA. No evidence  of infection at this time WBC count improved on labs today. Recent Labs  Lab 06/21/24 2152 06/22/24 0010 06/22/24 0346 06/23/24 0322 06/24/24 0256  WBC 13.8*  --    --  15.6* 7.6  LATICACIDVEN  --  2.2* 2.3*  --  0.7    Mobility: Encourage ambulation  Goals of care   Code Status: Prior   Diet:  Diet Order             Diet Carb Modified                   Nutritional status:  Body mass index is 20.98 kg/m.       Wounds:  -    Discharge Exam:   Vitals:   06/24/24 0700 06/24/24 0800 06/24/24 0900 06/24/24 1200  BP: 129/87  117/70   Pulse: 88  86   Resp: 20  14   Temp:  98.9 F (37.2 C)  98.6 F (37 C)  TempSrc:  Oral  Oral  SpO2: 100%  100%   Weight:      Height:        Body mass index is 20.98 kg/m.    Physical Exam: General exam: Pleasant, young African-American male.  Not in distress Skin: No rashes, lesions or ulcers. HEENT: Atraumatic, normocephalic, no obvious bleeding Lungs: Clear to auscultation bilaterally,  CVS: S1, S2, no murmur,   GI/Abd: Soft, mild epigastric eminence, nondistended, bowel sound present,   CNS: Alert, awake, oriented x 3 Psychiatry: Mood appropriate Extremities: No pedal edema, no calf tenderness,   Follow ups:    Follow-up Information     [facility] Follow up.   Contact information: [address] [place]  [address] [phone]                Discharge Instructions:   Discharge Instructions     Call MD for:  difficulty breathing, headache or visual disturbances   Complete by: As directed    Call MD for:  extreme fatigue   Complete by: As directed    Call MD for:  hives   Complete by: As directed    Call MD for:  persistant dizziness or light-headedness   Complete by: As directed    Call MD for:  persistant nausea and vomiting   Complete by: As directed    Call MD for:  severe uncontrolled pain   Complete by: As directed    Call MD for:  temperature >100.4   Complete by: As directed    Diet Carb Modified   Complete by: As directed    Discharge instructions   Complete by: As directed     Recommendations at discharge:   Recommend compliance to insulin  pump, blood sugar monitoring  For blood pressure control -continue carvedilol  and amlodipine  as before.  Hydralazine  dose has been increased to 100 mg 3 times daily.  Continue to monitor blood pressure at home.  Continue Reglan  PRN at home for nausea.  Discharge instructions for diabetes mellitus: Check blood sugar 3 times a day and bedtime at home. If blood sugar running above 200 or less than 70 please call your MD to adjust insulin . If you notice signs and symptoms of hypoglycemia (low blood sugar) like jitteriness, confusion, thirst, tremor and sweating, please check blood sugar, drink sugary drink/biscuits/sweets to increase sugar level and call MD or return to ER.      General discharge instructions: Follow with Primary MD  Pcp, No in 7 days  Please request your PCP  to go over your hospital tests, procedures, radiology results at the follow up. Please get your medicines reviewed and adjusted.  Your PCP may decide to repeat certain labs or tests as needed. Do not drive, operate heavy machinery, perform activities at heights, swimming or participation in water activities or provide baby sitting services if your were admitted for syncope or siezures until you have seen by Primary MD or a Neurologist and advised to do so again. [employer]  Controlled Substance Reporting System database was reviewed. Do not drive, operate heavy machinery, perform activities at heights, swim, participate in water activities or provide baby-sitting services while on medications for pain, sleep and mood until your outpatient physician has reevaluated you and advised to do so again.  You are strongly recommended to comply with the dose, frequency and duration of prescribed medications. Activity: As tolerated with Full fall precautions use walker/cane & assistance as needed Avoid using any recreational substances like cigarette, tobacco, alcohol,  or non-prescribed drug. If you experience worsening of your admission symptoms, develop shortness of breath, life threatening emergency, suicidal or homicidal thoughts you must seek medical attention immediately by calling 911 or calling your MD immediately  if symptoms less severe. You must read complete instructions/literature along with all the possible adverse reactions/side effects for all the medicines you take and that have been prescribed to you. Take any new medicine only after you have completely understood and accepted all the possible adverse reactions/side effects.  Wear Seat belts while driving. You were cared for by a hospitalist during your hospital stay. If you have any questions about your discharge medications or the care you received while you were in the hospital after you are discharged, you can call the unit and ask to speak with the hospitalist or the covering physician. Once you are discharged, your primary care physician will handle any further medical issues. Please note that NO REFILLS for any discharge medications will be authorized once you are discharged, as it is imperative that you return to your primary care physician (or establish a relationship with a primary care physician if you do not have one).   Increase activity slowly   Complete by: As directed        Discharge Medications:   Allergies as of 06/24/2024       Reactions   Nsaids Other (See Comments)   Contraindication due to to CKD   Banana Itching        Medication List     TAKE these medications    amLODipine  10 MG tablet Commonly known as: NORVASC  Take 1 tablet (10 mg total) by mouth daily.   Baqsimi One Pack 3 MG/DOSE Powd Generic drug: Glucagon Place 1 spray into the nose as needed (for low BGL).   Gvoke HypoPen 2-Pack 1 MG/0.2ML Soaj Generic drug: Glucagon Inject 1 Dose into the muscle Once PRN (hypoglycemia / low blood sugar).   carvedilol  12.5 MG tablet Commonly known as:  COREG  Take 2 tablets (25 mg total) by mouth 2 (two) times daily with a meal. What changed:  how much to take when to take this   Dexcom G6 Sensor Misc 1 each by Other route as directed. Change every 10 days.   Dexcom G6 Transmitter Misc 1 each by Other route as directed.   ergocalciferol 1.25 MG (50000 UT) capsule Commonly known as: VITAMIN D2 Take 50,000 Units by mouth every Wednesday.   hydrALAZINE  100 MG  tablet Commonly known as: APRESOLINE  Take 1 tablet (100 mg total) by mouth 3 (three) times daily. What changed:  medication strength how much to take   insulin  aspart 100 UNIT/ML FlexPen Commonly known as: NOVOLOG  Inject 130 Units into the skin every 3 (three) days.   insulin  degludec 100 UNIT/ML FlexTouch Pen Commonly known as: TRESIBA Inject 26 Units into the skin daily. Use if insulin  pump malfunctions.   metoCLOPramide  5 MG tablet Commonly known as: Reglan  Take 1 tablet (5 mg total) by mouth every 6 (six) hours as needed for nausea or vomiting.   Omnipod 5 DexG7G6 Pods Gen 5 Misc Inject 1 Device into the skin every 3 (three) days.   ondansetron  4 MG disintegrating tablet Commonly known as: ZOFRAN -ODT Take 1 tablet (4 mg total) by mouth every 8 (eight) hours as needed for nausea or vomiting.   pantoprazole  40 MG tablet Commonly known as: PROTONIX  Take 1 tablet (40 mg total) by mouth daily. What changed:  when to take this reasons to take this   torsemide 10 MG tablet Commonly known as: DEMADEX Take 10 mg by mouth as needed (swelling).         The results of significant diagnostics from this hospitalization (including imaging, microbiology, ancillary and laboratory) are listed below for reference.    Procedures and Diagnostic Studies:   No results found.   Labs:   Basic Metabolic Panel: Recent Labs  Lab 06/22/24 1546 06/22/24 1935 06/23/24 0322 06/24/24 0256 06/24/24 0558  NA 142 139 140 134* 135  K 3.8 3.8 3.7 3.2* 3.0*  CL 108 106  105 102 105  CO2 24 22 24 22 26   GLUCOSE 150* 264* 158* 161* 175*  BUN 29* 25* 22* 16 17  CREATININE 3.06* 3.06* 2.79* 2.85* 2.73*  CALCIUM 9.4 9.0 8.8* 8.0* 7.8*  MG  --   --   --   --  1.7  PHOS  --   --   --   --  2.7   GFR Estimated Creatinine Clearance: 34.2 mL/min (A) (by C-G formula based on SCr of 2.73 mg/dL (H)). Liver Function Tests: Recent Labs  Lab 06/21/24 2152  AST 36  ALT 64*  ALKPHOS 138*  BILITOT 0.7  PROT 8.1  ALBUMIN 4.6   Recent Labs  Lab 06/22/24 0010  LIPASE 29   No results for input(s): AMMONIA in the last 168 hours. Coagulation profile No results for input(s): INR, PROTIME in the last 168 hours.  CBC: Recent Labs  Lab 06/21/24 2152 06/22/24 0021 06/23/24 0322 06/24/24 0256  WBC 13.8*  --  15.6* 7.6  NEUTROABS 12.4*  --  12.6* 4.8  HGB 12.7* 13.6 12.1* 11.0*  HCT 38.8* 40.0 38.2* 36.0*  MCV 74.8*  --  78.8* 79.8*  PLT 251  --  197 143*   Cardiac Enzymes: No results for input(s): CKTOTAL, CKMB, CKMBINDEX, TROPONINI in the last 168 hours. BNP: Invalid input(s): POCBNP CBG: Recent Labs  Lab 06/24/24 0549 06/24/24 0646 06/24/24 0744 06/24/24 0855 06/24/24 1151  GLUCAP 166* 176* 163* 188* 265*   D-Dimer No results for input(s): DDIMER in the last 72 hours. Hgb A1c No results for input(s): HGBA1C in the last 72 hours. Lipid Profile No results for input(s): CHOL, HDL, LDLCALC, TRIG, CHOLHDL, LDLDIRECT in the last 72 hours. Thyroid function studies No results for input(s): TSH, T4TOTAL, T3FREE, THYROIDAB in the last 72 hours.  Invalid input(s): FREET3 Anemia work up No results for input(s): VITAMINB12, FOLATE, FERRITIN, TIBC, IRON,  RETICCTPCT in the last 72 hours. Microbiology No results found for this or any previous visit (from the past 240 hours).  Time coordinating discharge: 45 minutes  Signed: Marik Sedore  [facility] 06/26/2024, 7:52 AM

## 2024-06-24 NOTE — Plan of Care (Signed)

## 2024-07-02 NOTE — ED Provider Notes (Signed)
 Emergency Department Provider Note  This document was created using the aid of voice recognition Dragon dictation software.  History   Chief Complaint  Patient presents with  . Arm Pain     HPI  Anthony Graham is a 28 y.o. male who presents to the ED with complaints of right arm redness, discomfort  Patient is a diabetic, typically will place his continuous glucose monitor and he is right arm, left arm, right leg or left leg, placed a new, sterile glucose monitor into the right tricep yesterday unfortunately developed pain, redness and discomfort surrounding the area shortly after he removed the monitor and cleansed the area, unfortunately the redness and pain have progressed he wanted to come get checked out  No fevers or chills, no nausea or vomiting  Is a new or in his left arm to monitor blood sugar  Physical Exam   Vitals:   07/02/24 0057 07/02/24 0324 07/02/24 0401  BP: (!) 164/98 (!) 150/101 (!) 149/99  BP Location: Left arm Right arm Left arm  Patient Position: Sitting Sitting Sitting  Pulse: 102 97 99  Resp: 18 16 18   Temp: 98.2 F (36.8 C)  98.1 F (36.7 C)  TempSrc: Oral  Oral  SpO2: 99% 100% 99%  Weight: 59.9 kg (132 lb)    Height: 167.6 cm (5' 6)      Physical Exam Vitals and nursing note reviewed.  Constitutional:      Appearance: He is well-developed. He is not toxic-appearing or diaphoretic.  HENT:     Head: Normocephalic and atraumatic.     Nose: Nose normal.     Mouth/Throat:     Mouth: Mucous membranes are moist.   Eyes:     General: No scleral icterus.  Pulmonary:     Effort: Pulmonary effort is normal. No respiratory distress.   Musculoskeletal:     Cervical back: Normal range of motion and neck supple. No rigidity.   Skin:    General: Skin is warm and dry.     Capillary Refill: Capillary refill takes less than 2 seconds.     Findings: Erythema and rash present.     Comments: 3 cm x 4 cm area of induration and redness to  posterior right triceps, no obvious crepitus or fluctuance, good sensation distally, no obvious skin sloughing, Nikolsky sign negative, no obvious vesicles   Neurological:     General: No focal deficit present.     Mental Status: He is alert and oriented to person, place, and time.   Psychiatric:        Behavior: Behavior normal.     Procedure Note  Procedures  Medical Decision Making  Differentials considered: Cellulitis, abscess, myositis, deep space soft tissue necrotizing infection, contact dermatitis On my initial exam, the pt was calm, cooperative, conversant, follows commands appropriately, GCS 15, not tachycardic, not hypotensive, afebrile, no increased work of breathing or respiratory distress, no signs of impending respiratory failure.   Patient hooked up to cardiac telemetry for close monitoring, telemetry interpretation: Sinus rhythm, rate appropriate. Plan: ED Course as of 07/02/24 0525  Wed Jul 02, 2024  0306 ER provider interpretation of labs: Hematologic panel with leukocytosis with neutrophilic, metabolic panel with hyperglycemia as well as renal insufficiency, no gross acidosis, lactic acid not elevated  From review of prior metabolic panel patient appears to have baseline renal insufficiency on CKD [DC]  0307 Bedside point-of-care ultrasound soft tissue exam performed, please see imaging report for details  Bedside ultrasound with  soft tissue changes concerning for cellulitis  Initially patient was given a dose of Bactrim for cellulitis treatment, was not aware of renal insufficiency or CKD, given this and risk of hyperkalemia will transition patient to doxycycline for coverage skin, soft tissue organisms as well as MRSA, otherwise is well-appearing, was given IV fluids for hyperglycemia and his insulin  pump is giving additional insulin  for hyperglycemia management, ODT Zofran  for nausea, will demarcate area and keep a log area for progression versus improvement  redness and erythema, strict return precautions given for any worsening symptoms, given the patient is a diabetic increasing risk of progressive skin, soft tissue infection [DC]    ED Course User Index [DC] Olita Duncan, MD   Based on the above findings, I believe patient is hemodynamically stable for discharge  Patient/and family educated about specific return precautions for given chief complaint and symptoms.  Patient/and family educated about follow-up with PCP.  Patient/and family expressed understanding of return precautions and need for follow-up.  Patient discharged.   ED Clinical Impression   1. Cellulitis of right upper extremity Active  2. Hyperglycemia Active  3. Renal insufficiency Active   ED Assessment/Plan  Clinical Complexity Number and complexity of problems addressed: Medical Decision Making Problems Addressed: Cellulitis of right upper extremity: complicated acute illness or injury that poses a threat to life or bodily functions Hyperglycemia: complicated acute illness or injury with systemic symptoms Renal insufficiency: complicated acute illness or injury with systemic symptoms  Amount and/or Complexity of Data Reviewed Independent Historian: friend External Data Reviewed: notes.    Details: Reviewed prior hospital admission from [facility] where patient is admitted for DKA from earlier this month Labs: ordered. Radiology: ordered and independent interpretation performed. Discussion of management or test interpretation with external provider(s): Discussed case with on-call pharmacy about antibiotic management cellulitis in the setting of hyperglycemia and renal insufficiency  Risk OTC drugs. Prescription drug management.   Emergency Department Medication Summary: Medications  acetaminophen  (TYLENOL ) tablet 650 mg (has no administration in time range)    Or  acetaminophen  (TYLENOL ) suppository 650 mg (has no administration in time range)  sodium  chloride (bolus) 0.9 % bolus 1,000 mL (0 mL intravenous Stopped 07/02/24 0405)  ondansetron  (ZOFRAN ) injection 4 mg (4 mg intravenous Given 07/02/24 0255)  sulfamethoxazole-trimethoprim (BACTRIM DS) 800-160 mg per tablet 1 tablet (1 tablet oral Given 07/02/24 0254)   ED Disposition: ED Disposition     ED Disposition  Discharge   Condition  Stable   Comment  --        This document serves as a record of services personally performed by Olita Duncan, MD. It was created on their behalf by Olita Duncan, MD, a trained medical scribe. The creation of this record is the provider's dictation and/or activities during the visit.   Electronically signed by: Olita Duncan, MD 07/02/24 5:25 AM

## 2024-08-22 ENCOUNTER — Other Ambulatory Visit: Payer: Self-pay

## 2024-08-22 ENCOUNTER — Encounter (HOSPITAL_BASED_OUTPATIENT_CLINIC_OR_DEPARTMENT_OTHER): Payer: Self-pay

## 2024-08-22 ENCOUNTER — Emergency Department (HOSPITAL_BASED_OUTPATIENT_CLINIC_OR_DEPARTMENT_OTHER): Admission: EM | Admit: 2024-08-22 | Discharge: 2024-08-23 | Disposition: A | Discharge status: Home / Self Care

## 2024-08-22 DIAGNOSIS — E1143 Type 2 diabetes mellitus with diabetic autonomic (poly)neuropathy: Secondary | ICD-10-CM | POA: Insufficient documentation

## 2024-08-22 DIAGNOSIS — R112 Nausea with vomiting, unspecified: Secondary | ICD-10-CM | POA: Diagnosis present

## 2024-08-22 DIAGNOSIS — E1165 Type 2 diabetes mellitus with hyperglycemia: Secondary | ICD-10-CM | POA: Diagnosis not present

## 2024-08-22 DIAGNOSIS — R1110 Vomiting, unspecified: Secondary | ICD-10-CM | POA: Diagnosis present

## 2024-08-22 DIAGNOSIS — Z794 Long term (current) use of insulin: Secondary | ICD-10-CM | POA: Insufficient documentation

## 2024-08-22 DIAGNOSIS — K3184 Gastroparesis: Secondary | ICD-10-CM

## 2024-08-22 LAB — COMPREHENSIVE METABOLIC PANEL WITH GFR
ALT: 25 U/L (ref 0–44)
AST: 31 U/L (ref 15–41)
Albumin: 4.6 g/dL (ref 3.5–5.0)
Alkaline Phosphatase: 118 U/L (ref 38–126)
Anion gap: 16 — ABNORMAL HIGH (ref 5–15)
BUN: 20 mg/dL (ref 6–20)
CO2: 20 mmol/L — ABNORMAL LOW (ref 22–32)
Calcium: 9.6 mg/dL (ref 8.9–10.3)
Chloride: 106 mmol/L (ref 98–111)
Creatinine, Ser: 2.98 mg/dL — ABNORMAL HIGH (ref 0.61–1.24)
GFR, Estimated: 29 mL/min — ABNORMAL LOW (ref >60)
Glucose, Bld: 338 mg/dL — ABNORMAL HIGH (ref 70–99)
Potassium: 3.9 mmol/L (ref 3.5–5.1)
Sodium: 142 mmol/L (ref 135–145)
Total Bilirubin: 0.7 mg/dL (ref 0.0–1.2)
Total Protein: 7.6 g/dL (ref 6.5–8.1)

## 2024-08-22 LAB — URINALYSIS, ROUTINE W REFLEX MICROSCOPIC
Bilirubin Urine: NEGATIVE
Glucose, UA: >=500 mg/dL — AB
Ketones, ur: NEGATIVE mg/dL
Leukocytes,Ua: NEGATIVE
Nitrite: NEGATIVE
Protein, ur: >=300 mg/dL — AB
Specific Gravity, Urine: 1.02 (ref 1.005–1.030)
pH: 7 (ref 5.0–8.0)

## 2024-08-22 LAB — BASIC METABOLIC PANEL WITH GFR
Anion gap: 13 (ref 5–15)
BUN: 18 mg/dL (ref 6–20)
CO2: 20 mmol/L — ABNORMAL LOW (ref 22–32)
Calcium: 9.1 mg/dL (ref 8.9–10.3)
Chloride: 109 mmol/L (ref 98–111)
Creatinine, Ser: 2.89 mg/dL — ABNORMAL HIGH (ref 0.61–1.24)
GFR, Estimated: 30 mL/min — ABNORMAL LOW (ref >60)
Glucose, Bld: 80 mg/dL (ref 70–99)
Potassium: 3.7 mmol/L (ref 3.5–5.1)
Sodium: 143 mmol/L (ref 135–145)

## 2024-08-22 LAB — CBC
HCT: 37.4 % — ABNORMAL LOW (ref 39.0–52.0)
Hemoglobin: 12.1 g/dL — ABNORMAL LOW (ref 13.0–17.0)
MCH: 24.7 pg — ABNORMAL LOW (ref 26.0–34.0)
MCHC: 32.4 g/dL (ref 30.0–36.0)
MCV: 76.5 fL — ABNORMAL LOW (ref 80.0–100.0)
Platelets: 231 K/uL (ref 150–400)
RBC: 4.89 MIL/uL (ref 4.22–5.81)
RDW: 14.9 % (ref 11.5–15.5)
WBC: 9.8 K/uL (ref 4.0–10.5)
nRBC: 0 % (ref 0.0–0.2)

## 2024-08-22 LAB — LIPASE, BLOOD: Lipase: 31 U/L (ref 11–51)

## 2024-08-22 LAB — BETA-HYDROXYBUTYRIC ACID: Beta-Hydroxybutyric Acid: 0.49 mmol/L — ABNORMAL HIGH (ref 0.05–0.27)

## 2024-08-22 LAB — CBG MONITORING, ED
Glucose-Capillary: 115 mg/dL — ABNORMAL HIGH (ref 70–99)
Glucose-Capillary: 267 mg/dL — ABNORMAL HIGH (ref 70–99)
Glucose-Capillary: 78 mg/dL (ref 70–99)
Glucose-Capillary: 98 mg/dL (ref 70–99)

## 2024-08-22 LAB — LACTIC ACID, PLASMA
Lactic Acid, Venous: 3.2 mmol/L (ref 0.5–1.9)
Lactic Acid, Venous: 2.4 mmol/L (ref 0.5–1.9)

## 2024-08-22 LAB — URINALYSIS, MICROSCOPIC (REFLEX)

## 2024-08-22 MED ORDER — AMLODIPINE BESYLATE 5 MG PO TABS
10.0000 mg | ORAL_TABLET | Freq: Every day | ORAL | Status: DC
  Administered 2024-08-22 – 2024-08-23 (×2): 10 mg via ORAL
  Filled 2024-08-22 (×2): qty 2

## 2024-08-22 MED ORDER — HYDRALAZINE HCL 25 MG PO TABS: 100.0000 mg | ORAL_TABLET | Freq: Once | ORAL | Status: DC

## 2024-08-22 MED ORDER — ONDANSETRON HCL 4 MG/2ML IJ SOLN
4.0000 mg | Freq: Four times a day (QID) | INTRAMUSCULAR | Status: DC | PRN
  Administered 2024-08-23: 4 mg via INTRAVENOUS
  Filled 2024-08-22: qty 2

## 2024-08-22 MED ORDER — HYDRALAZINE HCL 20 MG/ML IJ SOLN
10.0000 mg | Freq: Four times a day (QID) | INTRAMUSCULAR | Status: DC | PRN
  Administered 2024-08-23: 10 mg via INTRAVENOUS
  Filled 2024-08-22: qty 1

## 2024-08-22 MED ORDER — INSULIN ASPART 100 UNIT/ML IJ SOLN
0.0000 [IU] | Freq: Four times a day (QID) | INTRAMUSCULAR | Status: DC
  Administered 2024-08-23: 5 [IU] via SUBCUTANEOUS

## 2024-08-22 MED ORDER — SODIUM CHLORIDE 0.9 % IV SOLN: Freq: Once | INTRAVENOUS | Status: AC

## 2024-08-22 MED ORDER — SODIUM CHLORIDE 0.9 % IV BOLUS
1000.0000 mL | Freq: Once | INTRAVENOUS | Status: AC
  Administered 2024-08-22: 1000 mL via INTRAVENOUS

## 2024-08-22 MED ORDER — CARVEDILOL 12.5 MG PO TABS
25.0000 mg | ORAL_TABLET | Freq: Two times a day (BID) | ORAL | Status: DC
  Administered 2024-08-22 – 2024-08-23 (×2): 25 mg via ORAL
  Filled 2024-08-22 (×2): qty 2

## 2024-08-22 MED ORDER — METOCLOPRAMIDE HCL 5 MG/ML IJ SOLN
5.0000 mg | Freq: Once | INTRAMUSCULAR | Status: AC
  Administered 2024-08-22: 5 mg via INTRAVENOUS
  Filled 2024-08-22: qty 2

## 2024-08-22 MED ORDER — METOCLOPRAMIDE HCL 5 MG/ML IJ SOLN
10.0000 mg | Freq: Three times a day (TID) | INTRAMUSCULAR | Status: DC
  Administered 2024-08-23: 10 mg via INTRAVENOUS
  Filled 2024-08-22: qty 2

## 2024-08-22 NOTE — ED Triage Notes (Signed)
 Arrives POV with complaints of vomiting and abdominal pain x1 day.  Hx of gastroparesis and diabetes

## 2024-08-22 NOTE — ED Notes (Signed)
 Pt provided with juice and ice cream. Tolerating well. Rpt CBG 78. Rpt lactic and bmp sent to lab awaiting results.

## 2024-08-22 NOTE — ED Provider Notes (Signed)
 [place] [facility] AT [facility] Provider Note   CSN: [id number] Arrival date & time: 08/22/24  8171     Patient presents with: Emesis and Abdominal Pain   Anthony Graham is a 28 y.o. male.    Emesis     Patient presents because of emesis.  Started today.  Having diarrhea as well.  Patient states it feels exactly like his previous episodes of gastroparesis.  Endorsing some generalized abdominal pain with mostly epigastric in nature.  No hematemesis.  No hematochezia.  No sick contacts he is aware of.  No chest pain or shortness of breath.  Patient states that glucose has been running slightly high today.  Low 300 to high 200 range.  Currently is on insulin  pump.'s been working.  Otherwise, denies all complaints.  Previous medical history reviewed : Patient was last admitted in July 2025.  Early DKA.  Metabolic acidosis with left acidosis.  History of gastroparesis as well.  Prior to Admission medications   Medication Sig Start Date End Date Taking? Authorizing Provider  amLODipine  (NORVASC ) 10 MG tablet Take 1 tablet (10 mg total) by mouth daily. 12/10/21   Gonfa, Taye T, MD  carvedilol  (COREG ) 12.5 MG tablet Take 2 tablets (25 mg total) by mouth 2 (two) times daily with a meal. Patient taking differently: Take 12.5 mg by mouth daily at 12 noon. 03/28/24 06/23/24  Perri DELENA Meliton Mickey., MD  Continuous Glucose Sensor (DEXCOM G6 SENSOR) MISC 1 each by Other route as directed. Change every 10 days. 02/28/24   [provider]  Continuous Glucose Transmitter (DEXCOM G6 TRANSMITTER) MISC 1 each by Other route as directed. 11/14/23   [provider]  ergocalciferol (VITAMIN D2) 1.25 MG (50000 UT) capsule Take 50,000 Units by mouth every Wednesday. Patient not taking: Reported on 06/23/2024    [provider]  Glucagon (BAQSIMI ONE PACK) 3 MG/DOSE POWD Place 1 spray into the nose as needed (for low BGL). 02/07/21   [provider]   GVOKE HYPOPEN 2-PACK 1 MG/0.2ML SOAJ Inject 1 Dose into the muscle Once PRN (hypoglycemia / low blood sugar). 06/11/24   [provider]  hydrALAZINE  (APRESOLINE ) 100 MG tablet Take 1 tablet (100 mg total) by mouth 3 (three) times daily. 06/24/24 07/24/24  Arlice Reichert, MD  insulin  aspart (NOVOLOG ) 100 UNIT/ML FlexPen Inject 130 Units into the skin every 3 (three) days. 01/30/17   [provider]  insulin  degludec (TRESIBA) 100 UNIT/ML FlexTouch Pen Inject 26 Units into the skin daily. Use if insulin  pump malfunctions. 05/23/22   [provider]  Insulin  Disposable Pump (OMNIPOD 5 G6 PODS, GEN 5,) MISC Inject 1 Device into the skin every 3 (three) days.    [provider]  metoCLOPramide  (REGLAN ) 5 MG tablet Take 1 tablet (5 mg total) by mouth every 6 (six) hours as needed for nausea or vomiting. Patient not taking: Reported on 06/23/2024 03/28/24 04/27/24  Perri DELENA Meliton Mickey., MD  ondansetron  (ZOFRAN -ODT) 4 MG disintegrating tablet Take 1 tablet (4 mg total) by mouth every 8 (eight) hours as needed for nausea or vomiting. Patient not taking: Reported on 06/23/2024 01/10/24   Tegeler, Lonni PARAS, MD  pantoprazole  (PROTONIX ) 40 MG tablet Take 1 tablet (40 mg total) by mouth daily. Patient taking differently: Take 40 mg by mouth as needed (heartburn). 04/30/22   Regalado, Belkys A, MD  torsemide (DEMADEX) 10 MG tablet Take 10 mg by mouth as needed (swelling). 12/27/22   [provider]  potassium chloride  SA (KLOR-CON ) 20 MEQ tablet Take 1 tablet (20 mEq total) by mouth daily. 03/02/20 08/05/20  Petrucelli, Lucie SAUNDERS, PA-C    Allergies: Nsaids and Banana    Review of Systems  Gastrointestinal:  Positive for vomiting.    Updated Vital Signs BP (!) 187/111   Pulse (!) 101   Temp 98 F (36.7 C) (Temporal)   Resp 17   Ht 5' 6 (1.676 m)   Wt 59 kg   SpO2 98%   BMI 20.99 kg/m   Physical Exam Vitals and nursing note reviewed.  Constitutional:       General: He is not in acute distress.    Appearance: He is well-developed.  HENT:     Head: Normocephalic and atraumatic.  Eyes:     Conjunctiva/sclera: Conjunctivae normal.  Cardiovascular:     Rate and Rhythm: Normal rate and regular rhythm.     Heart sounds: No murmur heard. Pulmonary:     Effort: Pulmonary effort is normal. No respiratory distress.     Breath sounds: Normal breath sounds.  Abdominal:     Palpations: Abdomen is soft.     Tenderness: There is no abdominal tenderness.  Musculoskeletal:        General: No swelling.     Cervical back: Neck supple.  Skin:    General: Skin is warm and dry.     Capillary Refill: Capillary refill takes less than 2 seconds.  Neurological:     Mental Status: He is alert.  Psychiatric:        Mood and Affect: Mood normal.     (all labs ordered are listed, but only abnormal results are displayed) Labs Reviewed  COMPREHENSIVE METABOLIC PANEL WITH GFR - Abnormal; Notable for the following components:      Result Value   CO2 20 (*)    Glucose, Bld 338 (*)    Creatinine, Ser 2.98 (*)    GFR, Estimated 29 (*)    Anion gap 16 (*)    All other components within normal limits  CBC - Abnormal; Notable for the following components:   Hemoglobin 12.1 (*)    HCT 37.4 (*)    MCV 76.5 (*)    MCH 24.7 (*)    All other components within normal limits  URINALYSIS, ROUTINE W REFLEX MICROSCOPIC - Abnormal; Notable for the following components:   Glucose, UA >=500 (*)    Hgb urine dipstick MODERATE (*)    Protein, ur >=300 (*)    All other components within normal limits  LACTIC ACID, PLASMA - Abnormal; Notable for the following components:   Lactic Acid, Venous 3.2 (*)    All other components within normal limits  LACTIC ACID, PLASMA - Abnormal; Notable for the following components:   Lactic Acid, Venous 2.4 (*)    All other components within normal limits  BETA-HYDROXYBUTYRIC ACID - Abnormal; Notable for the following components:    Beta-Hydroxybutyric Acid 0.49 (*)    All other components within normal limits  BASIC METABOLIC PANEL WITH GFR - Abnormal; Notable for the following components:   CO2 20 (*)    Creatinine, Ser 2.89 (*)    GFR, Estimated 30 (*)    All other components within normal limits  URINALYSIS, MICROSCOPIC (REFLEX) - Abnormal; Notable for the following components:   Bacteria, UA FEW (*)    All other components within normal limits  CBG MONITORING, ED - Abnormal; Notable for the following components:   Glucose-Capillary 267 (*)  All other components within normal limits  LIPASE, BLOOD  HEMOGLOBIN A1C  CBG MONITORING, ED  CBG MONITORING, ED    EKG: None  Radiology: No results found.   Procedures   Medications Ordered in the ED  insulin  aspart (novoLOG ) injection 0-6 Units (has no administration in time range)  ondansetron  (ZOFRAN ) injection 4 mg (has no administration in time range)  hydrALAZINE  (APRESOLINE ) injection 10 mg (has no administration in time range)  metoCLOPramide  (REGLAN ) injection 10 mg (has no administration in time range)  0.9 %  sodium chloride  infusion (has no administration in time range)  sodium chloride  0.9 % bolus 1,000 mL (0 mLs Intravenous Stopped 08/22/24 2108)  sodium chloride  0.9 % bolus 1,000 mL (0 mLs Intravenous Stopped 08/22/24 2108)  metoCLOPramide  (REGLAN ) injection 5 mg (5 mg Intravenous Given 08/22/24 1855)  sodium chloride  0.9 % bolus 1,000 mL (1,000 mLs Intravenous New Bag/Given 08/22/24 2217)  metoCLOPramide  (REGLAN ) injection 5 mg (5 mg Intravenous Given 08/22/24 2229)                                    Medical Decision Making Amount and/or Complexity of Data Reviewed Labs: ordered.  Risk Prescription drug management. Decision regarding hospitalization.   Patient presents because of emesis.  Started today.  Having diarrhea as well.  Patient states it feels exactly like his previous episodes of gastroparesis.  Endorsing some generalized  abdominal pain with mostly epigastric in nature.  No hematemesis.  No hematochezia.  No sick contacts he is aware of.  No chest pain or shortness of breath.  Patient states that glucose has been running slightly high today.  Low 300 to high 200 range.  Currently is on insulin  pump.'s been working.  Otherwise, denies all complaints.  Previous medical history reviewed : Patient was last admitted in July 2025.  Early DKA.  Metabolic acidosis with left acidosis.  History of gastroparesis as well.   Upon exam, patient slightly tachycardic.  Slightly hypertensive as well.  This seems to be all very consistent with previous episodes of gastroparesis.  Some generalized abdominal pain but nothing significant.  No rebound guarding or tenderness that could really appreciate on exam.  No indication for imaging at this point time.  Obtain laboratory workup.  Initial CMP showed slight acidosis with bicarb of 20 as well as anion gap of 16.  Patient had recently get himself 9 units of Lantus  before coming to the ED.  Subsequently, patient's glucose dropped on its own from 300 range down to 70s.  He is able to tolerate some apple juice to bring his blood glucose back up into the 80s to 90s.  Maintain mental status during this whole time.  After 2 L of normal saline.  Repeated BMP.  Anion gap closed.  Anion gap of 13.  Glucose was improved to 80 on this BMP as well.  Likely multifactorial including patient given himself insulin  before came to the ED as well as ongoing fluid resuscitation.   In terms of patient's lactic acidosis.  He seems to have this every time.  Likely in the setting of vomiting from his gastroparesis as well as his CKD and having a harder time clearing his lactic acid.  Downtrending lactic acid after 2 L but still slightly elevated 2.4.  Plan will give another liter of fluid and reassess.  Patient still having a hard time tolerating p.o. but not vomiting is  as much as what he was earlier in the day.   Every time he does take anything solid he subsequently vomits it back up.  Therefore, he will need to be admitted.  Patient's begin Reglan  to help with his gastroparesis. \   No indication for imaging at this point in time.   He states insulin  pump will be turned off and patient placed on sliding scale per hospitalist request.      Final diagnoses:  Nausea and vomiting, unspecified vomiting type  Gastroparesis    ED Discharge Orders     None          Simon Lavonia SAILOR, MD 08/22/24 2317

## 2024-08-22 NOTE — ED Notes (Signed)
 Went in to ask pt to turn off insulin  pump per MD request for insulin  sliding scale. Pt's mother states that she paused it for 2hrs. Pt asked to keep insulin  pump off at this time. Pt verbalized and mother verbalized understanding.

## 2024-08-22 NOTE — ED Notes (Signed)
 Pt provided with juice for CBG dropping per dexcom, recheck CBG in ED 98.

## 2024-08-22 NOTE — Plan of Care (Signed)
 [facility] to [facility] or [facility] progressive unit:  28 year old man past medical history of essential hypertension, DM type I on insulin  pump, diabetic gastroparesis and CKD stage 3B/4  presented emergency department with complaining of nausea, vomiting diarrhea that started today.  Patient reported that symptom feels like previous episodes of gastroparesis.Endorsing some generalized abdominal pain with mostly epigastric in nature.  No hematemesis.  No hematochezia.  No sick contacts he is aware of.  No chest pain or shortness of breath.  Patient states that glucose has been running slightly high today.  Low 300 to high 200 range.  Currently is on insulin  pump.'s been working.  Otherwise, denies all complaints.  At presentation to ED patient found tachycardic, hypertensive otherwise hemodynamically stable.  Afebrile. CBC unremarkable. CMP showing low bicarb 20, elevated blood glucose 338, creatinine 2.98, low GFR 29 and anion gap 16.  Slightly elevated beta-hydroxybutyrate level.  Elevated lactic acid 3.2.  Repeat BMP showing blood glucose level has been improved 338-80, low bicarb 20 and normal anion gap.  POC blood glucose improved 267-78.  UA showing ketone negative.  In the ED patient received total 3 L of LR bolus, Reglan  5 g x 2.  Home hydralazine  has been initiated.   Hospitalist has been consulted for further evaluation management of intractable nausea vomiting-in the setting of diabetic gastroparesis, hypertensive urgency, metabolic acidosis and lactic acidosis in the setting of dehydration.  Requested Dr. Simon to Tylenol  to asked the patient to turn off the insulin  pump.  I will start sliding scale insulin  coverage.  Anthony Cybulski, MD [facility] 08/22/2024, 11:14 PM

## 2024-08-22 NOTE — ED Notes (Signed)
 Pt ambulated to bathroom with steady gait with mother to make a bowel movement.

## 2024-08-23 DIAGNOSIS — E1143 Type 2 diabetes mellitus with diabetic autonomic (poly)neuropathy: Secondary | ICD-10-CM | POA: Diagnosis not present

## 2024-08-23 LAB — CBG MONITORING, ED
Glucose-Capillary: 247 mg/dL — ABNORMAL HIGH (ref 70–99)
Glucose-Capillary: 393 mg/dL — ABNORMAL HIGH (ref 70–99)
Glucose-Capillary: 122 mg/dL — ABNORMAL HIGH (ref 70–99)

## 2024-08-23 LAB — BASIC METABOLIC PANEL WITH GFR
Anion gap: 19 — ABNORMAL HIGH (ref 5–15)
BUN: 26 mg/dL — ABNORMAL HIGH (ref 6–20)
CO2: 20 mmol/L — ABNORMAL LOW (ref 22–32)
Calcium: 8.7 mg/dL — ABNORMAL LOW (ref 8.9–10.3)
Chloride: 106 mmol/L (ref 98–111)
Creatinine, Ser: 2.95 mg/dL — ABNORMAL HIGH (ref 0.61–1.24)
GFR, Estimated: 29 mL/min — ABNORMAL LOW (ref >60)
Glucose, Bld: 146 mg/dL — ABNORMAL HIGH (ref 70–99)
Potassium: 3.9 mmol/L (ref 3.5–5.1)
Sodium: 144 mmol/L (ref 135–145)

## 2024-08-23 LAB — HEMOGLOBIN A1C
Hgb A1c MFr Bld: 9.5 % — ABNORMAL HIGH (ref 4.8–5.6)
Mean Plasma Glucose: 225.95 mg/dL

## 2024-08-23 MED ORDER — MORPHINE SULFATE (PF) 4 MG/ML IV SOLN
4.0000 mg | Freq: Once | INTRAVENOUS | Status: AC
  Administered 2024-08-23: 4 mg via INTRAVENOUS
  Filled 2024-08-23: qty 1

## 2024-08-23 NOTE — ED Notes (Signed)
 Pt alert and oriented X 4 at the time of discharge. RR even and unlabored. No acute distress noted. Pt verbalized understanding of discharge instructions as discussed. Pt ambulatory to lobby at time of discharge.

## 2024-08-23 NOTE — ED Notes (Signed)
 Pt ambulated to and from bathroom with steady gait, placed back on monitor. IVF infusing. Awaiting bedside

## 2024-08-23 NOTE — ED Notes (Signed)
 Check CBG 393, RN Keoshia informed

## 2024-08-23 NOTE — ED Provider Notes (Signed)
  Physical Exam  BP (!) 171/105   Pulse (!) 105   Temp 100.1 F (37.8 C) (Oral)   Resp (!) 22   Ht 5' 6 (1.676 m)   Wt 59 kg   SpO2 99%   BMI 20.99 kg/m   Physical Exam  Procedures  Procedures  ED Course / MDM     Received care of patient from previous providers.  With this is a 28 year old male with a history of diabetes, gastroparesis, who presented last night with nausea and vomiting.  He was found to be hyperglycemic, but did not have DKA.  He was admitted with concern for intractable vomiting.  On reevaluation, he is feeling much better.  He has not had any vomiting this morning.  His glucose is down to 122.  Rechecked a BMP which shows his bicarb is still 20, however anion gap has increased to 19.  He did have a lactic acidosis when he arrived secondary to likely dehydration, has not been eating, and would also consider starvation ketosis.  His glucose at this time is improved.  Discussed the possibility that he might have ketosis secondary to his diabetes or starvation contributing to this lab, and it is reasonable to continue with the admission, however given he has clinically improved so much, has a normal glucose, his bicarb is still 20, I do not think it is unreasonable for him to return home with continued monitoring of his glucose levels and symptoms and returning if he worsens.  He prefers to go home at this time. Patient discharged in stable condition with understanding of reasons to return.        Dreama Longs, MD 08/23/24 1249

## 2024-08-24 ENCOUNTER — Emergency Department (HOSPITAL_BASED_OUTPATIENT_CLINIC_OR_DEPARTMENT_OTHER)
Admission: EM | Admit: 2024-08-24 | Discharge: 2024-08-24 | Disposition: A | Discharge status: Home / Self Care | Attending: Emergency Medicine | Admitting: Emergency Medicine

## 2024-08-24 ENCOUNTER — Encounter (HOSPITAL_BASED_OUTPATIENT_CLINIC_OR_DEPARTMENT_OTHER): Payer: Self-pay | Admitting: Urology

## 2024-08-24 ENCOUNTER — Other Ambulatory Visit: Payer: Self-pay

## 2024-08-24 DIAGNOSIS — R112 Nausea with vomiting, unspecified: Secondary | ICD-10-CM | POA: Diagnosis present

## 2024-08-24 DIAGNOSIS — D72829 Elevated white blood cell count, unspecified: Secondary | ICD-10-CM | POA: Diagnosis not present

## 2024-08-24 DIAGNOSIS — R000 Tachycardia, unspecified: Secondary | ICD-10-CM | POA: Insufficient documentation

## 2024-08-24 DIAGNOSIS — K3184 Gastroparesis: Secondary | ICD-10-CM | POA: Insufficient documentation

## 2024-08-24 DIAGNOSIS — Z794 Long term (current) use of insulin: Secondary | ICD-10-CM | POA: Diagnosis not present

## 2024-08-24 LAB — COMPREHENSIVE METABOLIC PANEL WITH GFR
ALT: 27 U/L (ref 0–44)
AST: 45 U/L — ABNORMAL HIGH (ref 15–41)
Albumin: 4.7 g/dL (ref 3.5–5.0)
Alkaline Phosphatase: 110 U/L (ref 38–126)
Anion gap: 17 — ABNORMAL HIGH (ref 5–15)
BUN: 25 mg/dL — ABNORMAL HIGH (ref 6–20)
CO2: 22 mmol/L (ref 22–32)
Calcium: 9.4 mg/dL (ref 8.9–10.3)
Chloride: 101 mmol/L (ref 98–111)
Creatinine, Ser: 3.11 mg/dL — ABNORMAL HIGH (ref 0.61–1.24)
GFR, Estimated: 27 mL/min — ABNORMAL LOW (ref >60)
Glucose, Bld: 85 mg/dL (ref 70–99)
Potassium: 3.8 mmol/L (ref 3.5–5.1)
Sodium: 140 mmol/L (ref 135–145)
Total Bilirubin: 0.8 mg/dL (ref 0.0–1.2)
Total Protein: 7.8 g/dL (ref 6.5–8.1)

## 2024-08-24 LAB — I-STAT VENOUS BLOOD GAS, ED
Acid-Base Excess: 3 mmol/L — ABNORMAL HIGH (ref 0.0–2.0)
Bicarbonate: 26.4 mmol/L (ref 20.0–28.0)
Calcium, Ion: 1.06 mmol/L — ABNORMAL LOW (ref 1.15–1.40)
HCT: 36 % — ABNORMAL LOW (ref 39.0–52.0)
Hemoglobin: 12.2 g/dL — ABNORMAL LOW (ref 13.0–17.0)
O2 Saturation: 99 %
Patient temperature: 97.8
Potassium: 4.2 mmol/L (ref 3.5–5.1)
Sodium: 140 mmol/L (ref 135–145)
TCO2: 28 mmol/L (ref 22–32)
pCO2, Ven: 35.7 mmHg — ABNORMAL LOW (ref 44–60)
pH, Ven: 7.476 — ABNORMAL HIGH (ref 7.25–7.43)
pO2, Ven: 138 mmHg — ABNORMAL HIGH (ref 32–45)

## 2024-08-24 LAB — CBC
HCT: 38.3 % — ABNORMAL LOW (ref 39.0–52.0)
Hemoglobin: 12.5 g/dL — ABNORMAL LOW (ref 13.0–17.0)
MCH: 24.6 pg — ABNORMAL LOW (ref 26.0–34.0)
MCHC: 32.6 g/dL (ref 30.0–36.0)
MCV: 75.4 fL — ABNORMAL LOW (ref 80.0–100.0)
Platelets: 260 K/uL (ref 150–400)
RBC: 5.08 MIL/uL (ref 4.22–5.81)
RDW: 15.2 % (ref 11.5–15.5)
WBC: 19.5 K/uL — ABNORMAL HIGH (ref 4.0–10.5)
nRBC: 0 % (ref 0.0–0.2)

## 2024-08-24 LAB — URINALYSIS, ROUTINE W REFLEX MICROSCOPIC
Bilirubin Urine: NEGATIVE
Glucose, UA: 100 mg/dL — AB
Ketones, ur: NEGATIVE mg/dL
Leukocytes,Ua: NEGATIVE
Nitrite: NEGATIVE
Protein, ur: >=300 mg/dL — AB
Specific Gravity, Urine: 1.025 (ref 1.005–1.030)
pH: 6.5 (ref 5.0–8.0)

## 2024-08-24 LAB — URINALYSIS, MICROSCOPIC (REFLEX)

## 2024-08-24 LAB — BETA-HYDROXYBUTYRIC ACID: Beta-Hydroxybutyric Acid: 1.42 mmol/L — ABNORMAL HIGH (ref 0.05–0.27)

## 2024-08-24 LAB — LACTIC ACID, PLASMA: Lactic Acid, Venous: 1.3 mmol/L (ref 0.5–1.9)

## 2024-08-24 LAB — LIPASE, BLOOD: Lipase: 28 U/L (ref 11–51)

## 2024-08-24 MED ORDER — HYDROMORPHONE HCL 1 MG/ML IJ SOLN
0.5000 mg | Freq: Once | INTRAMUSCULAR | Status: AC
  Administered 2024-08-24: 0.5 mg via INTRAVENOUS
  Filled 2024-08-24: qty 1

## 2024-08-24 MED ORDER — ONDANSETRON HCL 4 MG/2ML IJ SOLN
4.0000 mg | Freq: Once | INTRAMUSCULAR | Status: AC
  Administered 2024-08-24: 4 mg via INTRAVENOUS
  Filled 2024-08-24: qty 2

## 2024-08-24 MED ORDER — LACTATED RINGERS IV BOLUS
1000.0000 mL | Freq: Once | INTRAVENOUS | Status: AC
  Administered 2024-08-24: 1000 mL via INTRAVENOUS

## 2024-08-24 MED ORDER — LORAZEPAM 1 MG PO TABS
1.0000 mg | ORAL_TABLET | Freq: Once | ORAL | Status: AC
  Administered 2024-08-24: 1 mg via SUBLINGUAL
  Filled 2024-08-24: qty 1

## 2024-08-24 MED ORDER — HALOPERIDOL LACTATE 5 MG/ML IJ SOLN
2.0000 mg | Freq: Once | INTRAMUSCULAR | Status: AC
Start: 2024-08-24 — End: 2024-08-24
  Administered 2024-08-24: 2 mg via INTRAVENOUS
  Filled 2024-08-24: qty 1

## 2024-08-24 NOTE — ED Notes (Signed)

## 2024-08-24 NOTE — ED Provider Notes (Signed)
  [facility] DEPARTMENT AT [facility] Provider Note   CSN: [id number] Arrival date & time: 08/24/24  0023     Patient presents with: Emesis   Anthony Graham is a 28 y.o. male.   Patient returns to the emergency department for continued nausea and vomiting.  Patient was seen earlier and treated for hyperglycemia, lactic acidosis and intractable nausea and vomiting.  There was consideration for admission but he started to feel better and opted to go home.  Reports that he started vomiting again after getting to his house.       Prior to Admission medications   Medication Sig Start Date End Date Taking? Authorizing Provider  amLODipine  (NORVASC ) 10 MG tablet Take 1 tablet (10 mg total) by mouth daily. 12/10/21   Gonfa, Taye T, MD  carvedilol  (COREG ) 12.5 MG tablet Take 2 tablets (25 mg total) by mouth 2 (two) times daily with a meal. Patient taking differently: Take 12.5 mg by mouth daily at 12 noon. 03/28/24 06/23/24  Perri DELENA Meliton Mickey., MD  Continuous Glucose Sensor (DEXCOM G6 SENSOR) MISC 1 each by Other route as directed. Change every 10 days. 02/28/24   [provider]  Continuous Glucose Transmitter (DEXCOM G6 TRANSMITTER) MISC 1 each by Other route as directed. 11/14/23   [provider]  ergocalciferol (VITAMIN D2) 1.25 MG (50000 UT) capsule Take 50,000 Units by mouth every Wednesday. Patient not taking: Reported on 06/23/2024    [provider]  Glucagon (BAQSIMI ONE PACK) 3 MG/DOSE POWD Place 1 spray into the nose as needed (for low BGL). 02/07/21   [provider]  GVOKE HYPOPEN 2-PACK 1 MG/0.2ML SOAJ Inject 1 Dose into the muscle Once PRN (hypoglycemia / low blood sugar). 06/11/24   [provider]  hydrALAZINE  (APRESOLINE ) 100 MG tablet Take 1 tablet (100 mg total) by mouth 3 (three) times daily. 06/24/24 07/24/24  Arlice Reichert, MD  insulin  aspart (NOVOLOG ) 100 UNIT/ML FlexPen Inject 130 Units into the skin every 3  (three) days. 01/30/17   [provider]  insulin  degludec (TRESIBA) 100 UNIT/ML FlexTouch Pen Inject 26 Units into the skin daily. Use if insulin  pump malfunctions. 05/23/22   [provider]  Insulin  Disposable Pump (OMNIPOD 5 G6 PODS, GEN 5,) MISC Inject 1 Device into the skin every 3 (three) days.    [provider]  metoCLOPramide  (REGLAN ) 5 MG tablet Take 1 tablet (5 mg total) by mouth every 6 (six) hours as needed for nausea or vomiting. Patient not taking: Reported on 06/23/2024 03/28/24 04/27/24  Perri DELENA Meliton Mickey., MD  ondansetron  (ZOFRAN -ODT) 4 MG disintegrating tablet Take 1 tablet (4 mg total) by mouth every 8 (eight) hours as needed for nausea or vomiting. Patient not taking: Reported on 06/23/2024 01/10/24   Tegeler, Lonni PARAS, MD  pantoprazole  (PROTONIX ) 40 MG tablet Take 1 tablet (40 mg total) by mouth daily. Patient taking differently: Take 40 mg by mouth as needed (heartburn). 04/30/22   Regalado, Belkys A, MD  torsemide (DEMADEX) 10 MG tablet Take 10 mg by mouth as needed (swelling). 12/27/22   [provider]  potassium chloride  SA (KLOR-CON ) 20 MEQ tablet Take 1 tablet (20 mEq total) by mouth daily. 03/02/20 08/05/20  Petrucelli, Lucie SAUNDERS, PA-C    Allergies: Nsaids and Banana    Review of Systems  Updated Vital Signs BP (!) 175/116   Pulse (!) 116   Temp 97.8 F (36.6 C)   Resp 18   Ht 5' 6 (  1.676 m)   Wt 59 kg   SpO2 97%   BMI 20.99 kg/m   Physical Exam Vitals and nursing note reviewed.  Constitutional:      General: He is not in acute distress.    Appearance: He is well-developed.  HENT:     Head: Normocephalic and atraumatic.     Mouth/Throat:     Mouth: Mucous membranes are moist.  Eyes:     General: Vision grossly intact. Gaze aligned appropriately.     Extraocular Movements: Extraocular movements intact.     Conjunctiva/sclera: Conjunctivae normal.  Cardiovascular:     Rate and Rhythm: Regular rhythm.  Tachycardia present.     Pulses: Normal pulses.     Heart sounds: Normal heart sounds, S1 normal and S2 normal. No murmur heard.    No friction rub. No gallop.  Pulmonary:     Effort: Pulmonary effort is normal. No respiratory distress.     Breath sounds: Normal breath sounds.  Abdominal:     Palpations: Abdomen is soft.     Tenderness: There is no abdominal tenderness. There is no guarding or rebound.     Hernia: No hernia is present.  Musculoskeletal:        General: No swelling.     Cervical back: Full passive range of motion without pain, normal range of motion and neck supple. No pain with movement, spinous process tenderness or muscular tenderness. Normal range of motion.     Right lower leg: No edema.     Left lower leg: No edema.  Skin:    General: Skin is warm and dry.     Capillary Refill: Capillary refill takes less than 2 seconds.     Findings: No ecchymosis, erythema, lesion or wound.  Neurological:     Mental Status: He is alert and oriented to person, place, and time.     GCS: GCS eye subscore is 4. GCS verbal subscore is 5. GCS motor subscore is 6.     Cranial Nerves: Cranial nerves 2-12 are intact.     Sensory: Sensation is intact.     Motor: Motor function is intact. No weakness or abnormal muscle tone.     Coordination: Coordination is intact.  Psychiatric:        Mood and Affect: Mood normal.        Speech: Speech normal.        Behavior: Behavior normal.     (all labs ordered are listed, but only abnormal results are displayed) Labs Reviewed  COMPREHENSIVE METABOLIC PANEL WITH GFR - Abnormal; Notable for the following components:      Result Value   BUN 25 (*)    Creatinine, Ser 3.11 (*)    AST 45 (*)    GFR, Estimated 27 (*)    Anion gap 17 (*)    All other components within normal limits  CBC - Abnormal; Notable for the following components:   WBC 19.5 (*)    Hemoglobin 12.5 (*)    HCT 38.3 (*)    MCV 75.4 (*)    MCH 24.6 (*)    All other  components within normal limits  URINALYSIS, ROUTINE W REFLEX MICROSCOPIC - Abnormal; Notable for the following components:   Glucose, UA 100 (*)    Hgb urine dipstick SMALL (*)    Protein, ur >=300 (*)    All other components within normal limits  BETA-HYDROXYBUTYRIC ACID - Abnormal; Notable for the following components:   Beta-Hydroxybutyric Acid 1.42 (*)  All other components within normal limits  URINALYSIS, MICROSCOPIC (REFLEX) - Abnormal; Notable for the following components:   Bacteria, UA FEW (*)    All other components within normal limits  I-STAT VENOUS BLOOD GAS, ED - Abnormal; Notable for the following components:   pH, Ven 7.476 (*)    pCO2, Ven 35.7 (*)    pO2, Ven 138 (*)    Acid-Base Excess 3.0 (*)    Calcium, Ion 1.06 (*)    HCT 36.0 (*)    Hemoglobin 12.2 (*)    All other components within normal limits  LIPASE, BLOOD  LACTIC ACID, PLASMA    EKG: None  Radiology: No results found.   Procedures   Medications Ordered in the ED  lactated ringers  bolus 1,000 mL (0 mLs Intravenous Stopped 08/24/24 0230)  ondansetron  (ZOFRAN ) injection 4 mg (4 mg Intravenous Given 08/24/24 0124)  haloperidol  lactate (HALDOL ) injection 2 mg (2 mg Intravenous Given 08/24/24 0124)  LORazepam  (ATIVAN ) tablet 1 mg (1 mg Sublingual Given 08/24/24 0124)                                    Medical Decision Making Amount and/or Complexity of Data Reviewed External Data Reviewed: labs and notes. Labs: ordered. Decision-making details documented in ED Course.  Risk Prescription drug management.   Differential Diagnosis considered includes, but not limited to: Cholelithiasis; cholecystitis; cholangitis; bowel obstruction; esophagitis; gastritis; peptic ulcer disease; pancreatitis  Previous that was reviewed.  Patient did have lactic acidosis and fairly significant hyperglycemia at previous visit.  Repeat labs show improvement of electrolyte and metabolic abnormalities.  No  acidosis.  Exam is benign.  No focal abdominal tenderness or signs of peritonitis.  Patient treated symptomatically.  He has significantly improved.  He has been drinking juice without difficulty.  He does not feel like he requires hospitalization.     Final diagnoses:  Gastroparesis    ED Discharge Orders     None          Haze Lonni PARAS, MD 08/24/24 [phone]

## 2024-08-24 NOTE — ED Notes (Signed)
 Pt up to BR, states feeling better.

## 2024-08-24 NOTE — ED Notes (Signed)
 Pt wanting po intake, request for apple juice. Pt drinking at this time

## 2024-08-24 NOTE — ED Triage Notes (Signed)
 Pt seen on 9/12 for emesis and abd pain  States started vomiting again today and generalized abd pain  Denies any fever

## 2024-08-24 NOTE — ED Notes (Signed)
 Pt given discharge instructions.  States called mom and she is trying to get ahold of his aunt to come pick him up. If not mom can come when she gets off work at 6. Pt given pain medication per request due to increase in abd pain to 6/10.  Pt resting comfortably now awaiting ride home.

## 2024-08-26 NOTE — ED Triage Notes (Addendum)
 Pt c/o n/v, generalized abd pain x 2-3 days. Pt denies CP, SHOB, fevers, chills, diarrhea, urinary sx. Hx gastroparesis, T1DM, HTN

## 2024-08-27 ENCOUNTER — Observation Stay (HOSPITAL_BASED_OUTPATIENT_CLINIC_OR_DEPARTMENT_OTHER)
Admission: EM | Admit: 2024-08-27 | Discharge: 2024-08-28 | Disposition: A | Discharge status: Home / Self Care | Attending: Internal Medicine | Admitting: Internal Medicine

## 2024-08-27 ENCOUNTER — Other Ambulatory Visit: Payer: Self-pay

## 2024-08-27 ENCOUNTER — Encounter (HOSPITAL_BASED_OUTPATIENT_CLINIC_OR_DEPARTMENT_OTHER): Payer: Self-pay | Admitting: Emergency Medicine

## 2024-08-27 DIAGNOSIS — E1022 Type 1 diabetes mellitus with diabetic chronic kidney disease: Secondary | ICD-10-CM | POA: Diagnosis not present

## 2024-08-27 DIAGNOSIS — R112 Nausea with vomiting, unspecified: Secondary | ICD-10-CM | POA: Diagnosis present

## 2024-08-27 DIAGNOSIS — N184 Chronic kidney disease, stage 4 (severe): Secondary | ICD-10-CM | POA: Diagnosis not present

## 2024-08-27 DIAGNOSIS — Z743 Need for continuous supervision: Secondary | ICD-10-CM | POA: Diagnosis not present

## 2024-08-27 DIAGNOSIS — I10 Essential (primary) hypertension: Secondary | ICD-10-CM | POA: Diagnosis not present

## 2024-08-27 DIAGNOSIS — K3184 Gastroparesis: Secondary | ICD-10-CM | POA: Diagnosis not present

## 2024-08-27 DIAGNOSIS — I160 Hypertensive urgency: Secondary | ICD-10-CM | POA: Insufficient documentation

## 2024-08-27 DIAGNOSIS — I129 Hypertensive chronic kidney disease with stage 1 through stage 4 chronic kidney disease, or unspecified chronic kidney disease: Secondary | ICD-10-CM | POA: Insufficient documentation

## 2024-08-27 DIAGNOSIS — E1065 Type 1 diabetes mellitus with hyperglycemia: Secondary | ICD-10-CM | POA: Diagnosis not present

## 2024-08-27 DIAGNOSIS — Z794 Long term (current) use of insulin: Secondary | ICD-10-CM | POA: Insufficient documentation

## 2024-08-27 DIAGNOSIS — Z79899 Other long term (current) drug therapy: Secondary | ICD-10-CM | POA: Insufficient documentation

## 2024-08-27 LAB — COMPREHENSIVE METABOLIC PANEL WITH GFR
ALT: 22 U/L (ref 0–44)
AST: 37 U/L (ref 15–41)
Albumin: 4.2 g/dL (ref 3.5–5.0)
Alkaline Phosphatase: 96 U/L (ref 38–126)
Anion gap: 17 — ABNORMAL HIGH (ref 5–15)
BUN: 21 mg/dL — ABNORMAL HIGH (ref 6–20)
CO2: 22 mmol/L (ref 22–32)
Calcium: 9.2 mg/dL (ref 8.9–10.3)
Chloride: 100 mmol/L (ref 98–111)
Creatinine, Ser: 3.11 mg/dL — ABNORMAL HIGH (ref 0.61–1.24)
GFR, Estimated: 27 mL/min — ABNORMAL LOW (ref >60)
Glucose, Bld: 128 mg/dL — ABNORMAL HIGH (ref 70–99)
Potassium: 3.6 mmol/L (ref 3.5–5.1)
Sodium: 139 mmol/L (ref 135–145)
Total Bilirubin: 1.6 mg/dL — ABNORMAL HIGH (ref 0.0–1.2)
Total Protein: 7.3 g/dL (ref 6.5–8.1)

## 2024-08-27 LAB — CREATININE, SERUM
Creatinine, Ser: 2.57 mg/dL — ABNORMAL HIGH (ref 0.61–1.24)
GFR, Estimated: 34 mL/min — ABNORMAL LOW (ref >60)

## 2024-08-27 LAB — CBC
HCT: 42.7 % (ref 39.0–52.0)
Hemoglobin: 12.9 g/dL — ABNORMAL LOW (ref 13.0–17.0)
MCH: 24.1 pg — ABNORMAL LOW (ref 26.0–34.0)
MCHC: 30.2 g/dL (ref 30.0–36.0)
MCV: 79.7 fL — ABNORMAL LOW (ref 80.0–100.0)
Platelets: 76 K/uL — ABNORMAL LOW (ref 150–400)
RBC: 5.36 MIL/uL (ref 4.22–5.81)
RDW: 15.1 % (ref 11.5–15.5)
WBC: 8.5 K/uL (ref 4.0–10.5)
nRBC: 0 % (ref 0.0–0.2)

## 2024-08-27 LAB — CBC WITH DIFFERENTIAL/PLATELET
Abs Immature Granulocytes: 0.02 K/uL (ref 0.00–0.07)
Basophils Absolute: 0 K/uL (ref 0.0–0.1)
Basophils Relative: 0 %
Eosinophils Absolute: 0 K/uL (ref 0.0–0.5)
Eosinophils Relative: 1 %
HCT: 42.7 % (ref 39.0–52.0)
Hemoglobin: 14.1 g/dL (ref 13.0–17.0)
Immature Granulocytes: 0 %
Lymphocytes Relative: 17 %
Lymphs Abs: 1.4 K/uL (ref 0.7–4.0)
MCH: 24.7 pg — ABNORMAL LOW (ref 26.0–34.0)
MCHC: 33 g/dL (ref 30.0–36.0)
MCV: 74.9 fL — ABNORMAL LOW (ref 80.0–100.0)
Monocytes Absolute: 1 K/uL (ref 0.1–1.0)
Monocytes Relative: 13 %
Neutro Abs: 5.4 K/uL (ref 1.7–7.7)
Neutrophils Relative %: 69 %
Platelets: 102 K/uL — ABNORMAL LOW (ref 150–400)
RBC: 5.7 MIL/uL (ref 4.22–5.81)
RDW: 14.8 % (ref 11.5–15.5)
WBC: 7.9 K/uL (ref 4.0–10.5)
nRBC: 0 % (ref 0.0–0.2)

## 2024-08-27 LAB — LIPASE, BLOOD: Lipase: 33 U/L (ref 11–51)

## 2024-08-27 LAB — GLUCOSE, CAPILLARY
Glucose-Capillary: 259 mg/dL — ABNORMAL HIGH (ref 70–99)
Glucose-Capillary: 89 mg/dL (ref 70–99)

## 2024-08-27 LAB — CBG MONITORING, ED
Glucose-Capillary: 120 mg/dL — ABNORMAL HIGH (ref 70–99)
Glucose-Capillary: 134 mg/dL — ABNORMAL HIGH (ref 70–99)

## 2024-08-27 MED ORDER — ORAL CARE MOUTH RINSE: 15.0000 mL | OROMUCOSAL | Status: DC | PRN

## 2024-08-27 MED ORDER — SODIUM CHLORIDE 0.9 % IV BOLUS
1000.0000 mL | Freq: Once | INTRAVENOUS | Status: AC
  Administered 2024-08-27: 1000 mL via INTRAVENOUS

## 2024-08-27 MED ORDER — MORPHINE SULFATE (PF) 2 MG/ML IV SOLN
1.0000 mg | INTRAVENOUS | Status: DC | PRN
  Administered 2024-08-27: 1 mg via INTRAVENOUS
  Filled 2024-08-27: qty 1

## 2024-08-27 MED ORDER — INSULIN ASPART 100 UNIT/ML IJ SOLN: 0.0000 [IU] | Freq: Three times a day (TID) | INTRAMUSCULAR | Status: DC

## 2024-08-27 MED ORDER — AMLODIPINE BESYLATE 10 MG PO TABS
10.0000 mg | ORAL_TABLET | Freq: Every day | ORAL | Status: DC
  Administered 2024-08-27 – 2024-08-28 (×2): 10 mg via ORAL
  Filled 2024-08-27: qty 2
  Filled 2024-08-27: qty 1

## 2024-08-27 MED ORDER — ONDANSETRON HCL 4 MG/2ML IJ SOLN
4.0000 mg | Freq: Once | INTRAMUSCULAR | Status: AC
  Administered 2024-08-27: 4 mg via INTRAVENOUS
  Filled 2024-08-27: qty 2

## 2024-08-27 MED ORDER — MORPHINE SULFATE (PF) 4 MG/ML IV SOLN
4.0000 mg | Freq: Once | INTRAVENOUS | Status: AC
  Administered 2024-08-27: 4 mg via INTRAVENOUS
  Filled 2024-08-27: qty 1

## 2024-08-27 MED ORDER — HYDRALAZINE HCL 50 MG PO TABS
100.0000 mg | ORAL_TABLET | Freq: Three times a day (TID) | ORAL | Status: DC
Start: 2024-08-27 — End: 2024-08-28
  Administered 2024-08-27 – 2024-08-28 (×3): 100 mg via ORAL
  Filled 2024-08-27 (×3): qty 2

## 2024-08-27 MED ORDER — HYDRALAZINE HCL 20 MG/ML IJ SOLN
5.0000 mg | Freq: Four times a day (QID) | INTRAMUSCULAR | Status: DC | PRN
  Administered 2024-08-27: 5 mg via INTRAVENOUS
  Filled 2024-08-27: qty 1

## 2024-08-27 MED ORDER — LABETALOL HCL 5 MG/ML IV SOLN
10.0000 mg | INTRAVENOUS | Status: DC | PRN
  Administered 2024-08-27: 10 mg via INTRAVENOUS
  Filled 2024-08-27: qty 4

## 2024-08-27 MED ORDER — METOCLOPRAMIDE HCL 5 MG/ML IJ SOLN
10.0000 mg | Freq: Three times a day (TID) | INTRAMUSCULAR | Status: DC
  Administered 2024-08-27 – 2024-08-28 (×3): 10 mg via INTRAVENOUS
  Filled 2024-08-27 (×4): qty 2

## 2024-08-27 MED ORDER — PANTOPRAZOLE SODIUM 40 MG IV SOLR
40.0000 mg | Freq: Every day | INTRAVENOUS | Status: DC
  Administered 2024-08-27 – 2024-08-28 (×2): 40 mg via INTRAVENOUS
  Filled 2024-08-27 (×2): qty 10

## 2024-08-27 MED ORDER — CARVEDILOL 25 MG PO TABS
25.0000 mg | ORAL_TABLET | Freq: Two times a day (BID) | ORAL | Status: DC
  Administered 2024-08-27 – 2024-08-28 (×2): 25 mg via ORAL
  Filled 2024-08-27 (×2): qty 1

## 2024-08-27 MED ORDER — LABETALOL HCL 5 MG/ML IV SOLN
10.0000 mg | Freq: Once | INTRAVENOUS | Status: AC
  Administered 2024-08-27: 10 mg via INTRAVENOUS
  Filled 2024-08-27: qty 4

## 2024-08-27 MED ORDER — CARVEDILOL 12.5 MG PO TABS: 25.0000 mg | ORAL_TABLET | Freq: Two times a day (BID) | ORAL | Status: DC

## 2024-08-27 MED ORDER — OXYCODONE-ACETAMINOPHEN 5-325 MG PO TABS: 2.0000 | ORAL_TABLET | Freq: Once | ORAL | Status: DC

## 2024-08-27 MED ORDER — INSULIN ASPART 100 UNIT/ML FLEXPEN: 130.0000 [IU] | PEN_INJECTOR | SUBCUTANEOUS | Status: DC

## 2024-08-27 MED ORDER — CLINDAMYCIN HCL 150 MG PO CAPS: 300.0000 mg | ORAL_CAPSULE | Freq: Once | ORAL | Status: DC

## 2024-08-27 MED ORDER — INSULIN PUMP
Freq: Three times a day (TID) | SUBCUTANEOUS | Status: DC
  Filled 2024-08-27: qty 1

## 2024-08-27 MED ORDER — OMNIPOD 5 DEXG7G6 PODS GEN 5 MISC: 1.0000 | Status: DC

## 2024-08-27 MED ORDER — INSULIN ASPART 100 UNIT/ML IJ SOLN: 0.0000 [IU] | Freq: Every day | INTRAMUSCULAR | Status: DC

## 2024-08-27 MED ORDER — ONDANSETRON HCL 4 MG/2ML IJ SOLN: 4.0000 mg | Freq: Four times a day (QID) | INTRAMUSCULAR | Status: DC | PRN

## 2024-08-27 MED ORDER — ENOXAPARIN SODIUM 40 MG/0.4ML IJ SOSY
40.0000 mg | PREFILLED_SYRINGE | INTRAMUSCULAR | Status: DC
  Administered 2024-08-27: 40 mg via SUBCUTANEOUS
  Filled 2024-08-27: qty 0.4

## 2024-08-27 MED ORDER — DEXCOM G6 TRANSMITTER MISC: 1.0000 | Status: DC

## 2024-08-27 MED ORDER — METOCLOPRAMIDE HCL 5 MG/ML IJ SOLN
5.0000 mg | Freq: Once | INTRAMUSCULAR | Status: AC
  Administered 2024-08-27: 5 mg via INTRAVENOUS
  Filled 2024-08-27: qty 2

## 2024-08-27 MED ORDER — DEXCOM G6 SENSOR MISC: 1.0000 | Status: DC

## 2024-08-27 NOTE — Plan of Care (Signed)
 [facility] cardiac telemetry bed transfer (as there is no telemetry bed available to [facility]):  28 year old man past medical history of DM type I, gastroparesis, essential hypertension, diabetic retinopathy, CKD stage IV presented to emergency department with complaining of abdominal pain nausea vomiting and elevated blood pressure at home.  Patient reported that today he has severe abdominal pain and nausea vomiting.  Patient was seen in the ED yesterday however patient felt better and left home.   At presentation to ED patient is hypertensive blood pressure 202/152 improved to 187/127.  Initial heart rate 121 improved to 92.  O2 sat 100% room air.  CMP showing elevated creatinine 3.11, elevated bilirubin 1.6 normal bicarb, blood glucose 127 and elevated ion gap 17.  Normal lipase level. CBC unremarkable except low platelet 102.  In the ED patient received morphine  4 mg x 2, Zofran  x 2 and 2 L of NS bolus. Getting EKG.  Hospitalist has been consulted for further evaluation management AKI on CKD stage IIIb/IV, intractable nausea vomiting and hypertensive urgency.  Anouk Critzer, MD [facility] 08/27/2024, 6:44 AM

## 2024-08-27 NOTE — ED Triage Notes (Signed)
 Pt reports abd pain/n/v x 5 days, hx of same, has been seen numerous times for same, pt reports last smoked marijuana a couple of days ago

## 2024-08-27 NOTE — ED Notes (Signed)
 Pt is attempting PO chall at this time.  Pt seems optimistic and requesting sprite if able.

## 2024-08-27 NOTE — H&P (Addendum)
 [facility] H&P    Patient Demographics:    Anthony Graham, is a 28 y.o. male  MRN: [id number]  DOB - 11/27/1996  Admit Date - 08/27/2024    Outpatient Primary MD for the patient is Artis Roderick Kay, PA-C  Patient coming from: [facility]  Chief complaint-vomiting   HPI:    Anthony Graham  is a 28 y.o. male, with medical history of diabetes mellitus type 1, gastroparesis, essential hypertension, diabetic retinopathy, CKD stage IV came to [facility] with complaints of abdominal pain, nausea and vomiting with elevated blood pressure at home.  Patient says that he was seen at urgent care yesterday and is vomiting improved but he was concerned that it was started again after he went back home.  So he came to ED for further evaluation. Patient has insulin  pump and has CGM Dexcom, has been managing his blood glucose via insulin  pump. In the ED, lab work showed creatinine of 3.11, BUN 21 .  Denies chest pain or shortness of breath Usually manages nausea and vomiting with Reglan  at home, but has been unable to keep Reglan  at home Denies abdominal pain at this time, denies diarrhea or constipation Urine drug screen positive for THC    Review of systems:    In addition to the HPI above,   All other systems reviewed and are negative.    Past History of the following :    Past Medical History:  Diagnosis Date   Diabetic retinopathy (HCC)    DM (diabetes mellitus) (HCC)    Gastroparesis    HTN (hypertension)       Past Surgical History:  Procedure Laterality Date   ESOPHAGOGASTRODUODENOSCOPY     EYE SURGERY        Social History:      Social History   Tobacco Use   Smoking status: Never    Passive exposure: Never   Smokeless tobacco: Never  Substance Use Topics   Alcohol use: Not Currently    Comment: occ       Family History :     Family History  Problem Relation  Age of Onset   Diabetes Mother    Diabetes Other       Home Medications:   Prior to Admission medications   Medication Sig Start Date End Date Taking? Authorizing Provider  amLODipine  (NORVASC ) 10 MG tablet Take 1 tablet (10 mg total) by mouth daily. 12/10/21  Yes Gonfa, Taye T, MD  carvedilol  (COREG ) 12.5 MG tablet Take 2 tablets (25 mg total) by mouth 2 (two) times daily with a meal. 03/28/24 08/27/24 Yes Perri DELENA Meliton Mickey., MD  Continuous Glucose Sensor (DEXCOM G6 SENSOR) MISC 1 each by Other route as directed. Change every 10 days. 02/28/24  Yes [provider]  Continuous Glucose Transmitter (DEXCOM G6 TRANSMITTER) MISC 1 each by Other route as directed. 11/14/23  Yes [provider]  ergocalciferol (VITAMIN D2) 1.25 MG (50000 UT) capsule Take 50,000 Units by mouth every Wednesday.   Yes [provider]  Glucagon (  BAQSIMI ONE PACK) 3 MG/DOSE POWD Place 1 spray into the nose as needed (for low BGL). 02/07/21  Yes [provider]  GVOKE HYPOPEN 2-PACK 1 MG/0.2ML SOAJ Inject 1 Dose into the muscle Once PRN (hypoglycemia / low blood sugar). 06/11/24  Yes [provider]  hydrALAZINE  (APRESOLINE ) 100 MG tablet Take 1 tablet (100 mg total) by mouth 3 (three) times daily. 06/24/24 08/27/24 Yes Dahal, Chapman, MD  insulin  aspart (NOVOLOG ) 100 UNIT/ML FlexPen Inject 130 Units into the skin every 3 (three) days. 01/30/17  Yes [provider]  insulin  degludec (TRESIBA) 100 UNIT/ML FlexTouch Pen Inject 26 Units into the skin daily. Use if insulin  pump malfunctions. 05/23/22  Yes [provider]  Insulin  Disposable Pump (OMNIPOD 5 G6 PODS, GEN 5,) MISC Inject 1 Device into the skin every 3 (three) days.   Yes [provider]  metoCLOPramide  (REGLAN ) 5 MG tablet Take 1 tablet (5 mg total) by mouth every 6 (six) hours as needed for nausea or vomiting. 03/28/24 08/27/24 Yes Perri DELENA Meliton Mickey., MD  ondansetron  (ZOFRAN -ODT) 4 MG  disintegrating tablet Take 1 tablet (4 mg total) by mouth every 8 (eight) hours as needed for nausea or vomiting. 01/10/24  Yes Tegeler, Lonni PARAS, MD  pantoprazole  (PROTONIX ) 40 MG tablet Take 1 tablet (40 mg total) by mouth daily. Patient taking differently: Take 40 mg by mouth as needed (heartburn). 04/30/22  Yes Regalado, Belkys A, MD  torsemide (DEMADEX) 10 MG tablet Take 10 mg by mouth as needed (swelling). 12/27/22  Yes [provider]  potassium chloride  SA (KLOR-CON ) 20 MEQ tablet Take 1 tablet (20 mEq total) by mouth daily. 03/02/20 08/05/20  Petrucelli, Samantha R, PA-C     Allergies:     Allergies  Allergen Reactions   Nsaids Other (See Comments)    Contraindication due to to CKD   Banana Itching     Physical Exam:   Vitals  Blood pressure (!) 175/117, pulse (!) 101, temperature 97.9 F (36.6 C), resp. rate 20, height 5' 6 (1.676 m), weight 59 kg, SpO2 100%.  1.  General: Appears in no acute distress   2. Psychiatric: Alert, oriented x 3, intact insight and judgment  3. Neurologic: Cranial nerves II through XII grossly intact, no focal deficit noted  4. HEENMT:  Atraumatic normocephalic, extract muscle intact  5. Respiratory : Lungs are clear to auscultation bilaterally  6. Cardiovascular : S1-S2, regular, no murmur auscultated  7. Gastrointestinal:  Soft, nontender, no organomegaly    Data Review:    CBC Recent Labs  Lab 08/22/24 1846 08/24/24 0040 08/24/24 0118 08/27/24 0313  WBC 9.8 19.5*  --  7.9  HGB 12.1* 12.5* 12.2* 14.1  HCT 37.4* 38.3* 36.0* 42.7  PLT 231 260  --  102*  MCV 76.5* 75.4*  --  74.9*  MCH 24.7* 24.6*  --  24.7*  MCHC 32.4 32.6  --  33.0  RDW 14.9 15.2  --  14.8  LYMPHSABS  --   --   --  1.4  MONOABS  --   --   --  1.0  EOSABS  --   --   --  0.0  BASOSABS  --   --   --  0.0   ------------------------------------------------------------------------------------------------------------------  Results for  orders placed or performed during the hospital encounter of 08/27/24 (from the past 48 hours)  CBC with Differential     Status: Abnormal   Collection Time: 08/27/24  3:13 AM  Result Value  Ref Range   WBC 7.9 4.0 - 10.5 K/uL   RBC 5.70 4.22 - 5.81 MIL/uL   Hemoglobin 14.1 13.0 - 17.0 g/dL   HCT 57.2 60.9 - 47.9 %   MCV 74.9 (L) 80.0 - 100.0 fL   MCH 24.7 (L) 26.0 - 34.0 pg   MCHC 33.0 30.0 - 36.0 g/dL   RDW 85.1 88.4 - 84.4 %   Platelets 102 (L) 150 - 400 K/uL   nRBC 0.0 0.0 - 0.2 %   Neutrophils Relative % 69 %   Neutro Abs 5.4 1.7 - 7.7 K/uL   Lymphocytes Relative 17 %   Lymphs Abs 1.4 0.7 - 4.0 K/uL   Monocytes Relative 13 %   Monocytes Absolute 1.0 0.1 - 1.0 K/uL   Eosinophils Relative 1 %   Eosinophils Absolute 0.0 0.0 - 0.5 K/uL   Basophils Relative 0 %   Basophils Absolute 0.0 0.0 - 0.1 K/uL   Immature Granulocytes 0 %   Abs Immature Granulocytes 0.02 0.00 - 0.07 K/uL    Comment: Performed at [facility], [address] [place], [place] [address]  Comprehensive metabolic panel     Status: Abnormal   Collection Time: 08/27/24  3:13 AM  Result Value Ref Range   Sodium 139 135 - 145 mmol/L   Potassium 3.6 3.5 - 5.1 mmol/L   Chloride 100 98 - 111 mmol/L   CO2 22 22 - 32 mmol/L   Glucose, Bld 128 (H) 70 - 99 mg/dL    Comment: Glucose reference range applies only to samples taken after fasting for at least 8 hours.   BUN 21 (H) 6 - 20 mg/dL   Creatinine, Ser 6.88 (H) 0.61 - 1.24 mg/dL   Calcium 9.2 8.9 - 89.6 mg/dL   Total Protein 7.3 6.5 - 8.1 g/dL   Albumin 4.2 3.5 - 5.0 g/dL   AST 37 15 - 41 U/L   ALT 22 0 - 44 U/L   Alkaline Phosphatase 96 38 - 126 U/L   Total Bilirubin 1.6 (H) 0.0 - 1.2 mg/dL   GFR, Estimated 27 (L) >60 mL/min    Comment: (NOTE) Calculated using the CKD-EPI Creatinine Equation (2021)    Anion gap 17 (H) 5 - 15    Comment: Performed at [facility], [address] [place], [place] [address]  Lipase, blood     Status:  None   Collection Time: 08/27/24  3:13 AM  Result Value Ref Range   Lipase 33 11 - 51 U/L    Comment: Performed at [facility], [address] [place], [place] [address]  CBG monitoring, ED     Status: Abnormal   Collection Time: 08/27/24  9:10 AM  Result Value Ref Range   Glucose-Capillary 120 (H) 70 - 99 mg/dL    Comment: Glucose reference range applies only to samples taken after fasting for at least 8 hours.  CBG monitoring, ED     Status: Abnormal   Collection Time: 08/27/24 11:24 AM  Result Value Ref Range   Glucose-Capillary 134 (H) 70 - 99 mg/dL    Comment: Glucose reference range applies only to samples taken after fasting for at least 8 hours.  Glucose, capillary     Status: Abnormal   Collection Time: 08/27/24  4:19 PM  Result Value Ref Range   Glucose-Capillary 259 (H) 70 - 99 mg/dL    Comment: Glucose reference range applies only to samples taken after  fasting for at least 8 hours.    Chemistries  Recent Labs  Lab 08/22/24 1846 08/22/24 2109 08/23/24 1139 08/24/24 0040 08/24/24 0118 08/27/24 0313  NA 142 143 144 140 140 139  K 3.9 3.7 3.9 3.8 4.2 3.6  CL 106 109 106 101  --  100  CO2 20* 20* 20* 22  --  22  GLUCOSE 338* 80 146* 85  --  128*  BUN 20 18 26* 25*  --  21*  CREATININE 2.98* 2.89* 2.95* 3.11*  --  3.11*  CALCIUM 9.6 9.1 8.7* 9.4  --  9.2  AST 31  --   --  45*  --  37  ALT 25  --   --  27  --  22  ALKPHOS 118  --   --  110  --  96  BILITOT 0.7  --   --  0.8  --  1.6*   ------------------------------------------------------------------------------------------------------------------  ------------------------------------------------------------------------------------------------------------------ GFR: Estimated Creatinine Clearance: 30 mL/min (A) (by C-G formula based on SCr of 3.11 mg/dL (H)). Liver Function Tests: Recent Labs  Lab 08/22/24 1846 08/24/24 0040 08/27/24 0313  AST 31 45* 37  ALT 25 27 22   ALKPHOS 118 110 96   BILITOT 0.7 0.8 1.6*  PROT 7.6 7.8 7.3  ALBUMIN 4.6 4.7 4.2   Recent Labs  Lab 08/22/24 1846 08/24/24 0040 08/27/24 0313  LIPASE 31 28 33   No results for input(s): AMMONIA in the last 168 hours. Coagulation Profile: No results for input(s): INR, PROTIME in the last 168 hours. Cardiac Enzymes: No results for input(s): CKTOTAL, CKMB, CKMBINDEX, TROPONINI in the last 168 hours. BNP (last 3 results) No results for input(s): PROBNP in the last 8760 hours. HbA1C: No results for input(s): HGBA1C in the last 72 hours. CBG: Recent Labs  Lab 08/23/24 0602 08/23/24 1146 08/27/24 0910 08/27/24 1124 08/27/24 1619  GLUCAP 393* 122* 120* 134* 259*   Lipid Profile: No results for input(s): CHOL, HDL, LDLCALC, TRIG, CHOLHDL, LDLDIRECT in the last 72 hours. Thyroid Function Tests: No results for input(s): TSH, T4TOTAL, FREET4, T3FREE, THYROIDAB in the last 72 hours. Anemia Panel: No results for input(s): VITAMINB12, FOLATE, FERRITIN, TIBC, IRON, RETICCTPCT in the last 72 hours.  --------------------------------------------------------------------------------------------------------------- Urine analysis:    Component Value Date/Time   COLORURINE YELLOW 08/24/2024 0404   APPEARANCEUR CLEAR 08/24/2024 0404   LABSPEC 1.025 08/24/2024 0404   PHURINE 6.5 08/24/2024 0404   GLUCOSEU 100 (A) 08/24/2024 0404   HGBUR SMALL (A) 08/24/2024 0404   BILIRUBINUR NEGATIVE 08/24/2024 0404   KETONESUR NEGATIVE 08/24/2024 0404   PROTEINUR >=300 (A) 08/24/2024 0404   NITRITE NEGATIVE 08/24/2024 0404   LEUKOCYTESUR NEGATIVE 08/24/2024 0404      Imaging Results:    No results found.  My personal review of EKG: Rhythm NSR, T wave inversions in lead III   Assessment & Plan:    Principal Problem:   Intractable nausea and vomiting   Intractable nausea and vomiting-presented with intractable nausea and vomiting, last episode was yesterday.   Likely in setting of diabetic gastroparesis and also uses marijuana.?  Hyperemesis cannabinoid syndrome.  Still has nausea.  Will start with Reglan  10 mg IV every 8 hours.  Start Protonix  40 mg IV daily.  Lipase level is normal.  Diabetes mellitus type 1-patient has Dexcom CGM monitoring.  Uses insulin  pump.  Will consult diabetes coordinator as patient wants to continue using insulin  pump in the hospital.  Will check CBG q 6h  CKD stage  IV-creatinine stable at 3.11, at baseline.  Will start patient on gentle IV hydration with LR at 75 mL/h.  Follow BUN/creatinine in AM.  Hypertensive urgency-patient blood pressure elevated likely due to inability to take p.o. meds at home due to vomiting.  Will start hydralazine  IV 5 mg every 6 hours as needed.  Continue hydralazine  100 mg p.o. 3 times daily, amlodipine  10 mg daily, carvedilol  25 mg p.o. twice daily   DVT Prophylaxis-   Lovenox    AM Labs Ordered, also please review Full Orders  Family Communication: Admission, patients condition and plan of care including tests being ordered have been discussed with the patient full code who indicate understanding and agree with the plan and Code Status.  Code Status: Full code  Admission status: Inpatient  Time spent in minutes : 60 min   Marbeth Smedley S Twinkle Sockwell M.D

## 2024-08-27 NOTE — ED Provider Notes (Signed)
 [facility] Provider Note   CSN: [id number] Arrival date & time: 08/27/24  9766     Patient presents with: Abdominal Pain   Anthony Graham is a 28 y.o. male.  {Add pertinent medical, surgical, social history, OB history to HPI:32947} Patient is a 28 year old male with past medical history of type 1 diabetes diagnosed at the age of 6, gastroparesis with frequent ER visits involving vomiting and abdominal pain, hypertension.  Patient presenting today with complaints of severe abdominal pain and nausea and vomiting.  He was seen yesterday at [facility] where he was medicated and began feeling better and went home.  He denies any fevers or chills.  He does admit to marijuana use, most recently 2 days ago.       Prior to Admission medications   Medication Sig Start Date End Date Taking? Authorizing Provider  amLODipine  (NORVASC ) 10 MG tablet Take 1 tablet (10 mg total) by mouth daily. 12/10/21   Gonfa, Taye T, MD  carvedilol  (COREG ) 12.5 MG tablet Take 2 tablets (25 mg total) by mouth 2 (two) times daily with a meal. Patient taking differently: Take 12.5 mg by mouth daily at 12 noon. 03/28/24 06/23/24  Perri DELENA Meliton Mickey., MD  Continuous Glucose Sensor (DEXCOM G6 SENSOR) MISC 1 each by Other route as directed. Change every 10 days. 02/28/24   [provider]  Continuous Glucose Transmitter (DEXCOM G6 TRANSMITTER) MISC 1 each by Other route as directed. 11/14/23   [provider]  ergocalciferol (VITAMIN D2) 1.25 MG (50000 UT) capsule Take 50,000 Units by mouth every Wednesday. Patient not taking: Reported on 06/23/2024    [provider]  Glucagon (BAQSIMI ONE PACK) 3 MG/DOSE POWD Place 1 spray into the nose as needed (for low BGL). 02/07/21   [provider]  GVOKE HYPOPEN 2-PACK 1 MG/0.2ML SOAJ Inject 1 Dose into the muscle Once PRN (hypoglycemia / low blood sugar). 06/11/24   [provider]   hydrALAZINE  (APRESOLINE ) 100 MG tablet Take 1 tablet (100 mg total) by mouth 3 (three) times daily. 06/24/24 07/24/24  Arlice Reichert, MD  insulin  aspart (NOVOLOG ) 100 UNIT/ML FlexPen Inject 130 Units into the skin every 3 (three) days. 01/30/17   [provider]  insulin  degludec (TRESIBA) 100 UNIT/ML FlexTouch Pen Inject 26 Units into the skin daily. Use if insulin  pump malfunctions. 05/23/22   [provider]  Insulin  Disposable Pump (OMNIPOD 5 G6 PODS, GEN 5,) MISC Inject 1 Device into the skin every 3 (three) days.    [provider]  metoCLOPramide  (REGLAN ) 5 MG tablet Take 1 tablet (5 mg total) by mouth every 6 (six) hours as needed for nausea or vomiting. Patient not taking: Reported on 06/23/2024 03/28/24 04/27/24  Perri DELENA Meliton Mickey., MD  ondansetron  (ZOFRAN -ODT) 4 MG disintegrating tablet Take 1 tablet (4 mg total) by mouth every 8 (eight) hours as needed for nausea or vomiting. Patient not taking: Reported on 06/23/2024 01/10/24   Tegeler, Lonni PARAS, MD  pantoprazole  (PROTONIX ) 40 MG tablet Take 1 tablet (40 mg total) by mouth daily. Patient taking differently: Take 40 mg by mouth as needed (heartburn). 04/30/22   Regalado, Belkys A, MD  torsemide (DEMADEX) 10 MG tablet Take 10 mg by mouth as needed (swelling). 12/27/22   [provider]  potassium chloride  SA (KLOR-CON ) 20 MEQ tablet Take 1 tablet (20 mEq total) by mouth daily. 03/02/20 08/05/20  Petrucelli, Samantha R, PA-C    Allergies:  Nsaids and Banana    Review of Systems  All other systems reviewed and are negative.   Updated Vital Signs BP (!) 202/152 (BP Location: Right Arm)   Pulse (!) 121   Temp 98.3 F (36.8 C)   Resp 19   Ht 5' 6 (1.676 m)   Wt 59 kg   SpO2 100%   BMI 20.98 kg/m   Physical Exam Vitals and nursing note reviewed.  Constitutional:      General: He is not in acute distress.    Appearance: He is well-developed. He is not diaphoretic.  HENT:     Head:  Normocephalic and atraumatic.  Cardiovascular:     Rate and Rhythm: Normal rate and regular rhythm.     Heart sounds: No murmur heard.    No friction rub.  Pulmonary:     Effort: Pulmonary effort is normal. No respiratory distress.     Breath sounds: Normal breath sounds. No wheezing or rales.  Abdominal:     General: Bowel sounds are normal. There is no distension.     Palpations: Abdomen is soft.     Tenderness: There is generalized abdominal tenderness. There is no right CVA tenderness, left CVA tenderness, guarding or rebound.  Musculoskeletal:        General: Normal range of motion.     Cervical back: Normal range of motion and neck supple.  Skin:    General: Skin is warm and dry.  Neurological:     Mental Status: He is alert and oriented to person, place, and time.     Coordination: Coordination normal.     (all labs ordered are listed, but only abnormal results are displayed) Labs Reviewed  CBC WITH DIFFERENTIAL/PLATELET  COMPREHENSIVE METABOLIC PANEL WITH GFR  LIPASE, BLOOD    EKG: None  Radiology: No results found.  {Document cardiac monitor, telemetry assessment procedure when appropriate:32947} Procedures   Medications Ordered in the ED  sodium chloride  0.9 % bolus 1,000 mL (has no administration in time range)  ondansetron  (ZOFRAN ) injection 4 mg (has no administration in time range)  morphine  (PF) 4 MG/ML injection 4 mg (has no administration in time range)      {Click here for ABCD2, HEART and other calculators REFRESH Note before signing:1}                              Medical Decision Making Amount and/or Complexity of Data Reviewed Labs: ordered.  Risk Prescription drug management.   ***  {Document critical care time when appropriate  Document review of labs and clinical decision tools ie CHADS2VASC2, etc  Document your independent review of radiology images and any outside records  Document your discussion with family members, caretakers  and with consultants  Document social determinants of health affecting pt's care  Document your decision making why or why not admission, treatments were needed:32947:::1}   Final diagnoses:  None    ED Discharge Orders     None

## 2024-08-28 DIAGNOSIS — R112 Nausea with vomiting, unspecified: Secondary | ICD-10-CM | POA: Diagnosis not present

## 2024-08-28 LAB — CBC
HCT: 37.3 % — ABNORMAL LOW (ref 39.0–52.0)
Hemoglobin: 11.8 g/dL — ABNORMAL LOW (ref 13.0–17.0)
MCH: 24.8 pg — ABNORMAL LOW (ref 26.0–34.0)
MCHC: 31.6 g/dL (ref 30.0–36.0)
MCV: 78.5 fL — ABNORMAL LOW (ref 80.0–100.0)
Platelets: 79 K/uL — ABNORMAL LOW (ref 150–400)
RBC: 4.75 MIL/uL (ref 4.22–5.81)
RDW: 15.1 % (ref 11.5–15.5)
WBC: 6.2 K/uL (ref 4.0–10.5)
nRBC: 0 % (ref 0.0–0.2)

## 2024-08-28 LAB — GLUCOSE, CAPILLARY
Glucose-Capillary: 72 mg/dL (ref 70–99)
Glucose-Capillary: 94 mg/dL (ref 70–99)
Glucose-Capillary: 187 mg/dL — ABNORMAL HIGH (ref 70–99)
Glucose-Capillary: 189 mg/dL — ABNORMAL HIGH (ref 70–99)

## 2024-08-28 LAB — COMPREHENSIVE METABOLIC PANEL WITH GFR
ALT: 12 U/L (ref 0–44)
AST: 20 U/L (ref 15–41)
Albumin: 3.1 g/dL — ABNORMAL LOW (ref 3.5–5.0)
Alkaline Phosphatase: 72 U/L (ref 38–126)
Anion gap: 12 (ref 5–15)
BUN: 21 mg/dL — ABNORMAL HIGH (ref 6–20)
CO2: 20 mmol/L — ABNORMAL LOW (ref 22–32)
Calcium: 8.3 mg/dL — ABNORMAL LOW (ref 8.9–10.3)
Chloride: 104 mmol/L (ref 98–111)
Creatinine, Ser: 2.66 mg/dL — ABNORMAL HIGH (ref 0.61–1.24)
GFR, Estimated: 33 mL/min — ABNORMAL LOW (ref >60)
Glucose, Bld: 150 mg/dL — ABNORMAL HIGH (ref 70–99)
Potassium: 4.1 mmol/L (ref 3.5–5.1)
Sodium: 136 mmol/L (ref 135–145)
Total Bilirubin: 0.9 mg/dL (ref 0.0–1.2)
Total Protein: 5.3 g/dL — ABNORMAL LOW (ref 6.5–8.1)

## 2024-08-28 MED ORDER — METOCLOPRAMIDE HCL 5 MG PO TABS: 5.0000 mg | ORAL_TABLET | Freq: Four times a day (QID) | ORAL | 0 refills | Status: DC | PRN

## 2024-08-28 MED ORDER — HYDRALAZINE HCL 100 MG PO TABS: 100.0000 mg | ORAL_TABLET | Freq: Three times a day (TID) | ORAL | 0 refills | Status: DC

## 2024-08-28 MED ORDER — CARVEDILOL 12.5 MG PO TABS: 25.0000 mg | ORAL_TABLET | Freq: Two times a day (BID) | ORAL | 1 refills | Status: DC

## 2024-08-28 NOTE — TOC Initial Note (Signed)
 Transition of Care [facility]) - Initial/Assessment Note    Patient Details  Name: Anthony Graham MRN: [id number] Date of Birth: 1996/11/08  [facility] CM/SW Contact:    Bascom Service, RN Phone Number: 08/28/2024, 12:42 PM  Clinical Narrative: Patient declines any SA resources-states he can manage on own. Has own transport home.                 Expected Discharge Plan: Home/Self Care Barriers to Discharge: Continued Medical Work up   Patient Goals and CMS Choice Patient states their goals for this hospitalization and ongoing recovery are:: Home CMS Medicare.gov Compare Post Acute Care list provided to:: Patient Choice offered to / list presented to : Patient [place] ownership interest in [facility].provided to:: Patient    Expected Discharge Plan and Services                                              Prior Living Arrangements/Services                       Activities of Daily Living   ADL Screening (condition at time of admission) Independently performs ADLs?: Yes (appropriate for developmental age) Is the patient deaf or have difficulty hearing?: No Does the patient have difficulty seeing, even when wearing glasses/contacts?: No Does the patient have difficulty concentrating, remembering, or making decisions?: No  Permission Sought/Granted                  Emotional Assessment              Admission diagnosis:  Intractable nausea and vomiting [R11.2] Patient Active Problem List   Diagnosis Date Noted   Hyperglycemia 06/22/2024   Hypertensive urgency 06/22/2024   DKA, type 1 (HCC) 09/18/2023   Gastroparesis due to DM (HCC) 04/28/2022   Gastroparesis 04/28/2022   Proliferative diabetic retinopathy of left eye with macular edema associated with type 1 diabetes mellitus (HCC) 12/15/2021   CKD (chronic kidney disease), stage IV (HCC) 12/08/2021   Dehydration 12/08/2021   Leukocytosis 12/08/2021   HTN  (hypertension)    Diabetes mellitus without complication (HCC)    Intractable nausea and vomiting 12/07/2021   Normocytic anemia 08/22/2021   DKA (diabetic ketoacidosis) (HCC) 05/27/2021   Microhematuria 02/10/2021   Persistent proteinuria 02/10/2021   Vitamin D deficiency 02/10/2021   Hypermetropia 12/13/2012   PCP:  Artis Roderick Kay, PA-C Pharmacy:   [facility] [phone] - [place] - [address] AT [facility] [address] [place] [address] Phone: [phone] Fax: [phone]     Social Drivers of Health (SDOH) Social History: SDOH Screenings   Food Insecurity: No Food Insecurity (08/27/2024)  Housing: Low Risk  (08/27/2024)  Transportation Needs: No Transportation Needs (08/27/2024)  Utilities: Not At Risk (08/27/2024)  Tobacco Use: Low Risk  (08/27/2024)  Recent Concern: Tobacco Use - Medium Risk (07/02/2024)   Received from [facility]   SDOH Interventions:     Readmission Risk Interventions    06/24/2024   12:20 PM 03/27/2024   10:40 AM 09/21/2023   12:19 PM  Readmission Risk Prevention Plan  Transportation Screening Complete Complete Complete  PCP or Specialist Appt within 3-5 Days  Complete Complete  HRI or Home Care Consult  Complete Complete  Social Work  Consult for Recovery Care Planning/Counseling  Complete Complete  Palliative Care Screening  Not Applicable Not Applicable  Medication Review (RN Care Manager) Complete Complete Referral to Pharmacy  PCP or Specialist appointment within 3-5 days of discharge Complete    HRI or Home Care Consult Complete    SW Recovery Care/Counseling Consult Complete    Palliative Care Screening Not Applicable    Skilled Nursing Facility Not Applicable

## 2024-08-28 NOTE — TOC Transition Note (Signed)
 Transition of Care [facility]) - Discharge Note   Patient Details  Name: Tian Mcmurtrey MRN: [id number] Date of Birth: 1996-01-26  Transition of Care [facility]) CM/SW Contact:  Bascom Service, RN Phone Number: 08/28/2024, 2:50 PM   Clinical Narrative: d/c plan home.No further CM needs.      Final next level of care: Home/Self Care Barriers to Discharge: No Barriers Identified   Patient Goals and CMS Choice Patient states their goals for this hospitalization and ongoing recovery are:: Home CMS Medicare.gov Compare Post Acute Care list provided to:: Patient Choice offered to / list presented to : Patient [place] ownership interest in [facility].provided to:: Patient    Discharge Placement                       Discharge Plan and Services Additional resources added to the After Visit Summary for                                       Social Drivers of Health (SDOH) Interventions SDOH Screenings   Food Insecurity: No Food Insecurity (08/27/2024)  Housing: Low Risk  (08/27/2024)  Transportation Needs: No Transportation Needs (08/27/2024)  Utilities: Not At Risk (08/27/2024)  Tobacco Use: Low Risk  (08/27/2024)  Recent Concern: Tobacco Use - Medium Risk (07/02/2024)   Received from [facility]     Readmission Risk Interventions    06/24/2024   12:20 PM 03/27/2024   10:40 AM 09/21/2023   12:19 PM  Readmission Risk Prevention Plan  Transportation Screening Complete Complete Complete  PCP or Specialist Appt within 3-5 Days  Complete Complete  HRI or Home Care Consult  Complete Complete  Social Work Consult for Recovery Care Planning/Counseling  Complete Complete  Palliative Care Screening  Not Applicable Not Applicable  Medication Review [employer]) Complete Complete Referral to Pharmacy  PCP or Specialist appointment within 3-5 days of discharge Complete    HRI or Home Care Consult Complete    SW Recovery Care/Counseling Consult Complete     Palliative Care Screening Not Applicable    Skilled Nursing Facility Not Applicable

## 2024-08-28 NOTE — Discharge Summary (Signed)
 Physician Discharge Summary  Anthony Graham FMW:[id number] DOB: 1996/11/10 DOA: 08/27/2024  PCP: Artis Roderick Kay, PA-C  Admit date: 08/27/2024 Discharge date: 08/29/2024  Admitted From: Home  Discharge disposition: home    Recommendations for Outpatient Follow-Up:   Follow up with your primary care provider in one week.  Check CBC, BMP, magnesium in the next visit   Discharge Diagnosis:   Principal Problem:   Intractable nausea and vomiting  Discharge Condition: Improved.  Diet recommendation: Small portion low-fat diet.  Wound care: None.  Code status: Full.   History of Present Illness:   Anthony Graham  is a 28 y.o. male, with medical history of diabetes mellitus type 1, gastroparesis, essential hypertension, diabetic retinopathy, CKD stage IV presented to hospital with abdominal pain nausea vomiting with elevated blood pressure.  Patient is on insulin  pump at home.  Patient stated that he was able to keep his nausea under control at home with Reglan  but did not work at this time.  In the ED, patient was noted to have creatinine of 3.1.  Urine drug screen was positive for THC.  Patient was then admitted hospital for further evaluation and treatment.   Hospital Course:   Following conditions were addressed during hospitalization as listed below,  Intractable nausea and vomiting- Likely secondary to diabetic gastroparesis and also uses marijuana.  Question hyperemesis cannabinoid syndrome.  Counseled against THC.  Continue Reglan  Protonix  and supportive care.  Prescriptions given for Reglan  on discharge.  Patient was counseled on frequent small portion meals with low-fat.   Diabetes mellitus type 1- On insulin  pump.   Patient will initiate insulin  pump on discharge.   CKD stage IV-creatinine today at 2.6 from  3.11, likely at baseline.  Received IV hydration.  Patient was encouraged oral hydration on discharge.  Hypertensive urgency- Likely secondary to  inability to keep the medication down.  Blood pressure has been better now.  Continue Coreg , hydralazine , amlodipine  on discharge.  Refills given.  Disposition.  At this time, patient is stable for disposition home with outpatient PCP follow-up  Medical Consultants:   None.  Procedures:    None Subjective:   Today, patient was seen and examined at bedside.  Has tolerated oral diet and no more nausea or vomiting.  Wishes to go home.  Discharge Exam:   Vitals:   08/28/24 0841 08/28/24 1326  BP: (!) 141/112 104/65  Pulse: 90 85  Resp:  18  Temp:  98.5 F (36.9 C)  SpO2:  99%   Vitals:   08/28/24 0231 08/28/24 0604 08/28/24 0841 08/28/24 1326  BP: (!) 145/99 (!) 131/94 (!) 141/112 104/65  Pulse: 88 88 90 85  Resp: 16 16  18   Temp: 97.6 F (36.4 C) 98.5 F (36.9 C)  98.5 F (36.9 C)  TempSrc: Oral Oral  Oral  SpO2: 100% 100%  99%  Weight:      Height:       Body mass index is 20.98 kg/m.   General: Alert awake, not in obvious distress HENT: pupils equally reacting to light,  No scleral pallor or icterus noted. Oral mucosa is moist.  Chest:  Clear breath sounds.  No crackles or wheezes.  CVS: S1 &S2 heard. No murmur.  Regular rate and rhythm. Abdomen: Soft, nontender, nondistended.  Bowel sounds are heard.   Extremities: No cyanosis, clubbing or edema.  Peripheral pulses are palpable. Psych: Alert, awake and oriented, normal mood CNS:  No cranial nerve deficits.  Power equal in all extremities.  Skin: Warm and dry.  No rashes noted.  The results of significant diagnostics from this hospitalization (including imaging, microbiology, ancillary and laboratory) are listed below for reference.     Diagnostic Studies:   No results found.   Labs:   Basic Metabolic Panel: Recent Labs  Lab 08/22/24 2109 08/23/24 1139 08/24/24 0040 08/24/24 0118 08/27/24 0313 08/27/24 1724 08/28/24 0515  NA 143 144 140 140 139  --  136  K 3.7 3.9 3.8 4.2 3.6  --  4.1  CL  109 106 101  --  100  --  104  CO2 20* 20* 22  --  22  --  20*  GLUCOSE 80 146* 85  --  128*  --  150*  BUN 18 26* 25*  --  21*  --  21*  CREATININE 2.89* 2.95* 3.11*  --  3.11* 2.57* 2.66*  CALCIUM 9.1 8.7* 9.4  --  9.2  --  8.3*   GFR Estimated Creatinine Clearance: 35.1 mL/min (A) (by C-G formula based on SCr of 2.66 mg/dL (H)). Liver Function Tests: Recent Labs  Lab 08/22/24 1846 08/24/24 0040 08/27/24 0313 08/28/24 0515  AST 31 45* 37 20  ALT 25 27 22 12   ALKPHOS 118 110 96 72  BILITOT 0.7 0.8 1.6* 0.9  PROT 7.6 7.8 7.3 5.3*  ALBUMIN 4.6 4.7 4.2 3.1*   Recent Labs  Lab 08/22/24 1846 08/24/24 0040 08/27/24 0313  LIPASE 31 28 33   No results for input(s): AMMONIA in the last 168 hours. Coagulation profile No results for input(s): INR, PROTIME in the last 168 hours.  CBC: Recent Labs  Lab 08/22/24 1846 08/24/24 0040 08/24/24 0118 08/27/24 0313 08/27/24 1724 08/28/24 0515  WBC 9.8 19.5*  --  7.9 8.5 6.2  NEUTROABS  --   --   --  5.4  --   --   HGB 12.1* 12.5* 12.2* 14.1 12.9* 11.8*  HCT 37.4* 38.3* 36.0* 42.7 42.7 37.3*  MCV 76.5* 75.4*  --  74.9* 79.7* 78.5*  PLT 231 260  --  102* 76* 79*   Cardiac Enzymes: No results for input(s): CKTOTAL, CKMB, CKMBINDEX, TROPONINI in the last 168 hours. BNP: Invalid input(s): POCBNP CBG: Recent Labs  Lab 08/27/24 2245 08/28/24 0232 08/28/24 0256 08/28/24 0739 08/28/24 1141  GLUCAP 89 72 94 187* 189*   D-Dimer No results for input(s): DDIMER in the last 72 hours. Hgb A1c No results for input(s): HGBA1C in the last 72 hours. Lipid Profile No results for input(s): CHOL, HDL, LDLCALC, TRIG, CHOLHDL, LDLDIRECT in the last 72 hours. Thyroid function studies No results for input(s): TSH, T4TOTAL, T3FREE, THYROIDAB in the last 72 hours.  Invalid input(s): FREET3 Anemia work up No results for input(s): VITAMINB12, FOLATE, FERRITIN, TIBC, IRON, RETICCTPCT in  the last 72 hours. Microbiology No results found for this or any previous visit (from the past 240 hours).   Discharge Instructions:   Discharge Instructions     Call MD for:  persistant nausea and vomiting   Complete by: As directed    Call MD for:  severe uncontrolled pain   Complete by: As directed    Diet general   Complete by: As directed    Advance diet normal, stay on liquid/soft diet for couple of days.   Discharge instructions   Complete by: As directed    Follow-up with your primary care provider in 1 week.  Check blood work at that time.  Take a small portion low-fat meals  4-5 times a day instead of large meals.  Seek medical attention for worsening symptoms.   Increase activity slowly   Complete by: As directed       Allergies as of 08/28/2024       Reactions   Nsaids Other (See Comments)   Contraindication due to to CKD   Banana Itching        Medication List     TAKE these medications    amLODipine  10 MG tablet Commonly known as: NORVASC  Take 1 tablet (10 mg total) by mouth daily.   Baqsimi One Pack 3 MG/DOSE Powd Generic drug: Glucagon Place 1 spray into the nose as needed (for low BGL).   Gvoke HypoPen 2-Pack 1 MG/0.2ML Soaj Generic drug: Glucagon Inject 1 Dose into the muscle Once PRN (hypoglycemia / low blood sugar).   carvedilol  12.5 MG tablet Commonly known as: COREG  Take 2 tablets (25 mg total) by mouth 2 (two) times daily with a meal.   Dexcom G6 Sensor Misc 1 each by Other route as directed. Change every 10 days.   Dexcom G6 Transmitter Misc 1 each by Other route as directed.   ergocalciferol 1.25 MG (50000 UT) capsule Commonly known as: VITAMIN D2 Take 50,000 Units by mouth every Wednesday.   hydrALAZINE  100 MG tablet Commonly known as: APRESOLINE  Take 1 tablet (100 mg total) by mouth 3 (three) times daily.   insulin  aspart 100 UNIT/ML FlexPen Commonly known as: NOVOLOG  Inject 130 Units into the skin every 3 (three) days.    insulin  degludec 100 UNIT/ML FlexTouch Pen Commonly known as: TRESIBA Inject 26 Units into the skin daily. Use if insulin  pump malfunctions.   metoCLOPramide  5 MG tablet Commonly known as: Reglan  Take 1 tablet (5 mg total) by mouth every 6 (six) hours as needed for nausea or vomiting.   Omnipod 5 DexG7G6 Pods Gen 5 Misc Inject 1 Device into the skin every 3 (three) days.   ondansetron  4 MG disintegrating tablet Commonly known as: ZOFRAN -ODT Take 1 tablet (4 mg total) by mouth every 8 (eight) hours as needed for nausea or vomiting.   pantoprazole  40 MG tablet Commonly known as: PROTONIX  Take 1 tablet (40 mg total) by mouth daily. What changed:  when to take this reasons to take this   torsemide 10 MG tablet Commonly known as: DEMADEX Take 10 mg by mouth as needed (swelling).        Follow-up Information     Artis Roderick Kay, PA-C Follow up in 1 week(s).   Specialty: Internal Medicine Contact information: [address] [place] [address] [phone]                  Time coordinating discharge: 39 minutes  Signed:  Nikko Goldwire  [facility] 08/29/2024, 2:17 PM

## 2024-08-28 NOTE — Care Management Obs Status (Signed)
 MEDICARE OBSERVATION STATUS NOTIFICATION   Patient Details  Name: Anthony Graham MRN: [id number] Date of Birth: Nov 27, 1996   Medicare Observation Status Notification Given:  Yes    Bascom Service, RN 08/28/2024, 12:28 PM

## 2024-08-28 NOTE — Care Management CC44 (Signed)
 Condition Code 44 Documentation Completed  Patient Details  Name: Anthony Graham MRN: [id number] Date of Birth: Sep 21, 1996   Condition Code 44 given:  Yes Patient signature on Condition Code 44 notice:  Yes Documentation of 2 MD's agreement:  Yes Code 44 added to claim:  Yes    Bascom Service, RN 08/28/2024, 12:28 PM

## 2024-08-28 NOTE — Inpatient Diabetes Management (Incomplete)
 Inpatient Diabetes Program Recommendations  AACE/ADA: New Consensus Statement on Inpatient Glycemic Control (2015)  Target Ranges:  Prepandial:   less than 140 mg/dL      Peak postprandial:   less than 180 mg/dL (1-2 hours)      Critically ill patients:  140 - 180 mg/dL   Lab Results  Component Value Date   GLUCAP 189 (H) 08/28/2024   HGBA1C 9.5 (H) 08/22/2024    Review of Glycemic Control  Diabetes history: DM1 since age 28 Outpatient Diabetes medications: Insulin  pump Current orders for Inpatient glycemic control: Insulin  pump  HgbA1C - 9.5% CBGs today: 150, 187, 189 Trending well.   Inpatient Diabetes Program Recommendations:    Continue with insulin  pump.  Spoke with pt at bedside regarding his Type 1 DM, insulin  pump and HgbA1C of 9.5%.

## 2024-11-16 ENCOUNTER — Emergency Department (HOSPITAL_BASED_OUTPATIENT_CLINIC_OR_DEPARTMENT_OTHER)

## 2024-11-16 ENCOUNTER — Other Ambulatory Visit: Payer: Self-pay

## 2024-11-16 ENCOUNTER — Emergency Department (HOSPITAL_BASED_OUTPATIENT_CLINIC_OR_DEPARTMENT_OTHER)
Admission: EM | Admit: 2024-11-16 | Discharge: 2024-11-16 | Disposition: A | Discharge status: Home / Self Care | Attending: Emergency Medicine | Admitting: Emergency Medicine

## 2024-11-16 ENCOUNTER — Encounter (HOSPITAL_BASED_OUTPATIENT_CLINIC_OR_DEPARTMENT_OTHER): Payer: Self-pay | Admitting: Emergency Medicine

## 2024-11-16 DIAGNOSIS — R112 Nausea with vomiting, unspecified: Secondary | ICD-10-CM

## 2024-11-16 DIAGNOSIS — I10 Essential (primary) hypertension: Secondary | ICD-10-CM

## 2024-11-16 DIAGNOSIS — Z91199 Patient's noncompliance with other medical treatment and regimen due to unspecified reason: Secondary | ICD-10-CM

## 2024-11-16 LAB — CBC WITH DIFFERENTIAL/PLATELET
Abs Immature Granulocytes: 0.04 K/uL (ref 0.00–0.07)
Basophils Absolute: 0 K/uL (ref 0.0–0.1)
Basophils Relative: 0 %
Eosinophils Absolute: 0 K/uL (ref 0.0–0.5)
Eosinophils Relative: 0 %
HCT: 37.6 % — ABNORMAL LOW (ref 39.0–52.0)
Hemoglobin: 11.9 g/dL — ABNORMAL LOW (ref 13.0–17.0)
Immature Granulocytes: 0 %
Lymphocytes Relative: 7 %
Lymphs Abs: 0.7 K/uL (ref 0.7–4.0)
MCH: 24.3 pg — ABNORMAL LOW (ref 26.0–34.0)
MCHC: 31.6 g/dL (ref 30.0–36.0)
MCV: 76.9 fL — ABNORMAL LOW (ref 80.0–100.0)
Monocytes Absolute: 0.3 K/uL (ref 0.1–1.0)
Monocytes Relative: 4 %
Neutro Abs: 8.6 K/uL — ABNORMAL HIGH (ref 1.7–7.7)
Neutrophils Relative %: 89 %
Platelets: 182 K/uL (ref 150–400)
RBC: 4.89 MIL/uL (ref 4.22–5.81)
RDW: 14.6 % (ref 11.5–15.5)
WBC: 9.7 K/uL (ref 4.0–10.5)
nRBC: 0 % (ref 0.0–0.2)

## 2024-11-16 LAB — CBG MONITORING, ED: Glucose-Capillary: 202 mg/dL — ABNORMAL HIGH (ref 70–99)

## 2024-11-16 LAB — I-STAT VENOUS BLOOD GAS, ED
Acid-Base Excess: 0 mmol/L (ref 0.0–2.0)
Bicarbonate: 25.4 mmol/L (ref 20.0–28.0)
Calcium, Ion: 1.16 mmol/L (ref 1.15–1.40)
HCT: 43 % (ref 39.0–52.0)
Hemoglobin: 14.6 g/dL (ref 13.0–17.0)
O2 Saturation: 86 %
Patient temperature: 97.4
Potassium: 4.1 mmol/L (ref 3.5–5.1)
Sodium: 143 mmol/L (ref 135–145)
TCO2: 27 mmol/L (ref 22–32)
pCO2, Ven: 40.4 mmHg — ABNORMAL LOW (ref 44–60)
pH, Ven: 7.403 (ref 7.25–7.43)
pO2, Ven: 50 mmHg — ABNORMAL HIGH (ref 32–45)

## 2024-11-16 LAB — LIPASE, BLOOD: Lipase: 28 U/L (ref 11–51)

## 2024-11-16 LAB — COMPREHENSIVE METABOLIC PANEL WITH GFR
ALT: 22 U/L (ref 0–44)
AST: 32 U/L (ref 15–41)
Albumin: 4.4 g/dL (ref 3.5–5.0)
Alkaline Phosphatase: 115 U/L (ref 38–126)
Anion gap: 16 — ABNORMAL HIGH (ref 5–15)
BUN: 21 mg/dL — ABNORMAL HIGH (ref 6–20)
CO2: 23 mmol/L (ref 22–32)
Calcium: 9.7 mg/dL (ref 8.9–10.3)
Chloride: 106 mmol/L (ref 98–111)
Creatinine, Ser: 3.26 mg/dL — ABNORMAL HIGH (ref 0.61–1.24)
GFR, Estimated: 26 mL/min — ABNORMAL LOW (ref >60)
Glucose, Bld: 212 mg/dL — ABNORMAL HIGH (ref 70–99)
Potassium: 4.3 mmol/L (ref 3.5–5.1)
Sodium: 144 mmol/L (ref 135–145)
Total Bilirubin: 0.8 mg/dL (ref 0.0–1.2)
Total Protein: 7.3 g/dL (ref 6.5–8.1)

## 2024-11-16 LAB — BETA-HYDROXYBUTYRIC ACID: Beta-Hydroxybutyric Acid: 1.61 mmol/L — ABNORMAL HIGH (ref 0.05–0.27)

## 2024-11-16 LAB — LACTIC ACID, PLASMA
Lactic Acid, Venous: 2.5 mmol/L (ref 0.5–1.9)
Lactic Acid, Venous: 1.6 mmol/L (ref 0.5–1.9)

## 2024-11-16 LAB — MAGNESIUM: Magnesium: 2 mg/dL (ref 1.7–2.4)

## 2024-11-16 MED ORDER — LABETALOL HCL 5 MG/ML IV SOLN
10.0000 mg | Freq: Once | INTRAVENOUS | Status: AC
  Administered 2024-11-16: 10 mg via INTRAVENOUS
  Filled 2024-11-16: qty 4

## 2024-11-16 MED ORDER — ONDANSETRON HCL 4 MG/2ML IJ SOLN
4.0000 mg | Freq: Once | INTRAMUSCULAR | Status: AC
  Administered 2024-11-16: 4 mg via INTRAVENOUS
  Filled 2024-11-16: qty 2

## 2024-11-16 MED ORDER — ONDANSETRON 4 MG PO TBDP: 4.0000 mg | ORAL_TABLET | Freq: Three times a day (TID) | ORAL | 0 refills | Status: AC | PRN

## 2024-11-16 MED ORDER — LABETALOL HCL 5 MG/ML IV SOLN
20.0000 mg | Freq: Once | INTRAVENOUS | Status: AC
  Administered 2024-11-16: 20 mg via INTRAVENOUS
  Filled 2024-11-16: qty 4

## 2024-11-16 MED ORDER — METOCLOPRAMIDE HCL 5 MG PO TABS: 5.0000 mg | ORAL_TABLET | Freq: Four times a day (QID) | ORAL | 0 refills | Status: DC | PRN

## 2024-11-16 MED ORDER — HYDRALAZINE HCL 100 MG PO TABS: 100.0000 mg | ORAL_TABLET | Freq: Three times a day (TID) | ORAL | 0 refills | Status: AC

## 2024-11-16 MED ORDER — SODIUM CHLORIDE 0.9 % IV BOLUS
1000.0000 mL | Freq: Once | INTRAVENOUS | Status: AC
  Administered 2024-11-16: 1000 mL via INTRAVENOUS

## 2024-11-16 MED ORDER — DROPERIDOL 2.5 MG/ML IJ SOLN
1.2500 mg | Freq: Once | INTRAMUSCULAR | Status: AC
  Administered 2024-11-16: 1.25 mg via INTRAVENOUS
  Filled 2024-11-16: qty 2

## 2024-11-16 MED ORDER — MORPHINE SULFATE (PF) 4 MG/ML IV SOLN
4.0000 mg | Freq: Once | INTRAVENOUS | Status: AC
  Administered 2024-11-16: 4 mg via INTRAVENOUS
  Filled 2024-11-16: qty 1

## 2024-11-16 NOTE — ED Notes (Signed)
 Pt PO challenged, feels nauseous. Pt requesting more nausea meds. Britni, PA notified

## 2024-11-16 NOTE — ED Notes (Signed)
Pt in bathroom attempting urine sample.

## 2024-11-16 NOTE — ED Provider Notes (Cosign Needed Addendum)
 [name] Provider Note   CSN: [id number] Arrival date & time: 11/16/24  1742     Patient presents with: Emesis and Diarrhea   Anthony Graham is a 28 y.o. male here for evaluation of nausea, vomiting, diarrhea and abdominal pain.  Started this morning.  He is a type I diabetic, history of CKD, hypertension-- noncompliant medications.  He has a history of gastroparesis.  He uses marijuana.  Denies any headache, dizziness, vision changes, chest pain, shortness of breath, back pain, dysuria, hematuria.  No bloody stool.  No blood in emesis.  Feels exactly like his prior history of gastroparesis episodes.  No recent antibiotics or travel.  2 episodes of loose stool.   HPI     Prior to Admission medications   Medication Sig Start Date End Date Taking? Authorizing Provider  amLODipine  (NORVASC ) 10 MG tablet Take 1 tablet (10 mg total) by mouth daily. 12/10/21   Gonfa, Taye T, MD  carvedilol  (COREG ) 12.5 MG tablet Take 2 tablets (25 mg total) by mouth 2 (two) times daily with a meal. 08/28/24 09/27/24  Pokhrel, Vernal, MD  Continuous Glucose Sensor (DEXCOM G6 SENSOR) MISC 1 each by Other route as directed. Change every 10 days. 02/28/24   [provider]  Continuous Glucose Transmitter (DEXCOM G6 TRANSMITTER) MISC 1 each by Other route as directed. 11/14/23   [provider]  ergocalciferol (VITAMIN D2) 1.25 MG (50000 UT) capsule Take 50,000 Units by mouth every Wednesday.    [provider]  Glucagon (BAQSIMI ONE PACK) 3 MG/DOSE POWD Place 1 spray into the nose as needed (for low BGL). 02/07/21   [provider]  GVOKE HYPOPEN 2-PACK 1 MG/0.2ML SOAJ Inject 1 Dose into the muscle Once PRN (hypoglycemia / low blood sugar). 06/11/24   [provider]  hydrALAZINE  (APRESOLINE ) 100 MG tablet Take 1 tablet (100 mg total) by mouth 3 (three) times daily. 11/16/24 12/16/24  Oleta Gunnoe A, PA-C  insulin  aspart  (NOVOLOG ) 100 UNIT/ML FlexPen Inject 130 Units into the skin every 3 (three) days. 01/30/17   [provider]  insulin  degludec (TRESIBA) 100 UNIT/ML FlexTouch Pen Inject 26 Units into the skin daily. Use if insulin  pump malfunctions. 05/23/22   [provider]  Insulin  Disposable Pump (OMNIPOD 5 G6 PODS, GEN 5,) MISC Inject 1 Device into the skin every 3 (three) days.    [provider]  metoCLOPramide  (REGLAN ) 5 MG tablet Take 1 tablet (5 mg total) by mouth every 6 (six) hours as needed for nausea or vomiting. 11/16/24 12/16/24  Olliver Boyadjian A, PA-C  ondansetron  (ZOFRAN -ODT) 4 MG disintegrating tablet Take 1 tablet (4 mg total) by mouth every 8 (eight) hours as needed for nausea or vomiting. 11/16/24   Kavion Mancinas A, PA-C  pantoprazole  (PROTONIX ) 40 MG tablet Take 1 tablet (40 mg total) by mouth daily. Patient taking differently: Take 40 mg by mouth as needed (heartburn). 04/30/22   Regalado, Belkys A, MD  torsemide (DEMADEX) 10 MG tablet Take 10 mg by mouth as needed (swelling). 12/27/22   [provider]  potassium chloride  SA (KLOR-CON ) 20 MEQ tablet Take 1 tablet (20 mEq total) by mouth daily. 03/02/20 08/05/20  Petrucelli, Lucie SAUNDERS, PA-C    Allergies: Nsaids and Banana    Review of Systems  Constitutional: Negative.   HENT: Negative.    Respiratory: Negative.    Cardiovascular: Negative.   Gastrointestinal:  Positive for abdominal pain, diarrhea, nausea and vomiting. Negative  for abdominal distention, anal bleeding, blood in stool, constipation and rectal pain.  Genitourinary: Negative.   Musculoskeletal: Negative.   Skin: Negative.   Neurological: Negative.   All other systems reviewed and are negative.   Updated Vital Signs BP (!) 197/135   Pulse (!) 103   Temp (!) 97.4 F (36.3 C)   Resp 16   Ht 5' 6 (1.676 m)   Wt 59 kg   SpO2 100%   BMI 20.98 kg/m   Physical Exam Vitals and nursing note reviewed.  Constitutional:      General:  He is not in acute distress.    Appearance: He is well-developed. He is not ill-appearing, toxic-appearing or diaphoretic.  HENT:     Head: Normocephalic and atraumatic.     Mouth/Throat:     Mouth: Mucous membranes are dry.  Eyes:     Pupils: Pupils are equal, round, and reactive to light.  Cardiovascular:     Rate and Rhythm: Regular rhythm. Tachycardia present.     Pulses: Normal pulses.          Radial pulses are 2+ on the right side and 2+ on the left side.       Dorsalis pedis pulses are 2+ on the right side and 2+ on the left side.     Heart sounds: Normal heart sounds.  Pulmonary:     Effort: Pulmonary effort is normal. No respiratory distress.     Breath sounds: Normal breath sounds.  Abdominal:     General: Bowel sounds are normal. There is no distension.     Palpations: Abdomen is soft.     Tenderness: There is abdominal tenderness. There is no right CVA tenderness, left CVA tenderness, guarding or rebound.  Musculoskeletal:        General: No swelling, tenderness, deformity or signs of injury. Normal range of motion.     Cervical back: Normal range of motion and neck supple.     Right lower leg: No edema.     Left lower leg: No edema.  Skin:    General: Skin is warm and dry.     Capillary Refill: Capillary refill takes less than 2 seconds.  Neurological:     General: No focal deficit present.     Mental Status: He is alert and oriented to person, place, and time.     Cranial Nerves: No cranial nerve deficit.     Motor: No weakness.     Gait: Gait normal.     (all labs ordered are listed, but only abnormal results are displayed) Labs Reviewed  CBC WITH DIFFERENTIAL/PLATELET - Abnormal; Notable for the following components:      Result Value   Hemoglobin 11.9 (*)    HCT 37.6 (*)    MCV 76.9 (*)    MCH 24.3 (*)    Neutro Abs 8.6 (*)    All other components within normal limits  COMPREHENSIVE METABOLIC PANEL WITH GFR - Abnormal; Notable for the following  components:   Glucose, Bld 212 (*)    BUN 21 (*)    Creatinine, Ser 3.26 (*)    GFR, Estimated 26 (*)    Anion gap 16 (*)    All other components within normal limits  BETA-HYDROXYBUTYRIC ACID - Abnormal; Notable for the following components:   Beta-Hydroxybutyric Acid 1.61 (*)    All other components within normal limits  LACTIC ACID, PLASMA - Abnormal; Notable for the following components:   Lactic Acid, Venous 2.5 (*)  All other components within normal limits  CBG MONITORING, ED - Abnormal; Notable for the following components:   Glucose-Capillary 202 (*)    All other components within normal limits  I-STAT VENOUS BLOOD GAS, ED - Abnormal; Notable for the following components:   pCO2, Ven 40.4 (*)    pO2, Ven 50 (*)    All other components within normal limits  LIPASE, BLOOD  LACTIC ACID, PLASMA  MAGNESIUM  URINALYSIS, W/ REFLEX TO CULTURE (INFECTION SUSPECTED)  URINE DRUG SCREEN    EKG: EKG Interpretation Date/Time:  Sunday November 16 2024 19:21:56 EST Ventricular Rate:  109 PR Interval:  152 QRS Duration:  81 QT Interval:  315 QTC Calculation: 425 R Axis:   64  Text Interpretation: Sinus tachycardia Right atrial enlargement Borderline T wave abnormalities no sig change from previous Confirmed by Armenta Canning [phone]) on 11/16/2024 7:51:37 PM  Radiology: CT CHEST ABDOMEN PELVIS WO CONTRAST Result Date: 11/16/2024 EXAM: CT CHEST, ABDOMEN AND PELVIS WITHOUT CONTRAST 11/16/2024 06:55:50 PM TECHNIQUE: CT of the chest, abdomen and pelvis was performed without the administration of intravenous contrast. Multiplanar reformatted images are provided for review. Automated exposure control, iterative reconstruction, and/or weight based adjustment of the mA/kV was utilized to reduce the radiation dose to as low as reasonably achievable. COMPARISON: CT chest and pelvis from 04/26/2024. CLINICAL HISTORY: cp, abd pain FINDINGS: CHEST: MEDIASTINUM AND LYMPH NODES: Heart and  pericardium are unremarkable. The central airways are clear. No gross hilar adenopathy with limited evaluation on this non-contrast study. No mediastinal or axillary lymphadenopathy. LUNGS AND PLEURA: No focal consolidation or pulmonary edema. No pleural effusion or pneumothorax. ABDOMEN AND PELVIS: LIVER: The liver is unremarkable. GALLBLADDER AND BILE DUCTS: Gallbladder is unremarkable. No biliary ductal dilatation. SPLEEN: No acute abnormality. PANCREAS: No acute abnormality. ADRENAL GLANDS: No acute abnormality. KIDNEYS, URETERS AND BLADDER: No stones in the kidneys or ureters. No hydronephrosis. No perinephric or periureteral stranding. Questioned urinary bladder dome wall thickening (5.67 mm). Finding may be due to underdistention of the urinary bladder dome. GI AND BOWEL: Stomach demonstrates no acute abnormality. No small or large bowel thickening or dilatation. The appendix is unremarkable. REPRODUCTIVE ORGANS: The prostate is unremarkable. PERITONEUM AND RETROPERITONEUM: No ascites. No free air. VASCULATURE: Aorta is normal in caliber. Atherosclerotic plaque of the sma. ABDOMINAL AND PELVIS LYMPH NODES: No lymphadenopathy. BONES AND SOFT TISSUES: Slightly heterogeneous bones with no focal osseous lesion. No suspicious lytic or blastic osseous lesion. No focal soft tissue abnormality. IMPRESSION: 1. No acute abnormality of the chest, abdomen, and pelvis. Limited evaluation due to non-contrast technique. 2. Questioned urinary bladder dome wall thickening, possibly due to underdistention. Recommend correlation with urinalysis. Electronically signed by: Morgane Naveau MD 11/16/2024 07:14 PM EST RP Workstation: [id number]     Procedures   Medications Ordered in the ED  sodium chloride  0.9 % bolus 1,000 mL (0 mLs Intravenous Stopped 11/16/24 2039)  morphine  (PF) 4 MG/ML injection 4 mg (4 mg Intravenous Given 11/16/24 1924)  ondansetron  (ZOFRAN ) injection 4 mg (4 mg Intravenous Given 11/16/24 1923)   labetalol  (NORMODYNE ) injection 10 mg (10 mg Intravenous Given 11/16/24 1949)  labetalol  (NORMODYNE ) injection 20 mg (20 mg Intravenous Given 11/16/24 2113)  droperidol  (INAPSINE ) 2.5 MG/ML injection 1.25 mg (1.25 mg Intravenous Given 11/16/24 2124)  sodium chloride  0.9 % bolus 1,000 mL (0 mLs Intravenous Stopped 11/16/24 7535)   28 year old multiple medical comorbidities here for evaluation of abdominal pain, nausea, vomiting diarrhea.  Started earlier today.  He has known medication noncompliant.  Type I diabetic.  History of CKD, hypertension.  On arrival he is significantly hypertensive, tachycardic.  Actively vomiting.  No blood in stool or emesis.  He is diffusely tender to his abdomen.  No urinary symptoms.  He denies any headache, vision changes, lightheadedness or dizziness.  Creatinine around baseline is 3.  Will plan on labs, imaging, reassess.  Labs and imaging personally viewed interpreted:  CBG 202 CBC without leukocytosis, hemoglobin 11.9--similar to prior Metabolic panel creatinine 3.26-baseline around 3, anion gap 16 Magnesium 2.0 Lactic acid 1.6 lipase 20 Beta hydroxy 1.6 VBG pH 7.4, bicarb 25  CT chest abdomen pelvis without contrast shows no significant abnormality  Patient significantly hypertensive.  He states his blood pressures are typically very elevated he does not take his blood pressure medications at home.  He denies any headache, vision changes.  Patient reassessed.  Given IV blood pressure control, antiemetics.  Patient reassessed.  States he feels improved.  Wants to go home.  Urinated here however did not collect sample.  Blood pressures with mild improvement however prior chart reviewed he is significantly hypertensive at his other ED visits and admissions.  Discussed shared decision making given his significant hypertension.  Patient declines admission at this time as he states he feels improved and would like to go home.  I have refilled his blood pressure  medication he states he does not have any at home.  He does state he has insulin  however.  I encouraged compliance.  I wrote for some additional antiemetics as well.  He will return for new or worsening symptoms.  Low suspicion for hypertensive urgency, emergency, ACS, dissection, bacterial infectious process, intracranial abnormality.  Has benign abdominal exam here.                                  Medical Decision Making Amount and/or Complexity of Data Reviewed Independent Historian: parent External Data Reviewed: labs, radiology, ECG and notes. Labs: ordered. Decision-making details documented in ED Course. Radiology: ordered and independent interpretation performed. Decision-making details documented in ED Course. ECG/medicine tests: ordered and independent interpretation performed. Decision-making details documented in ED Course.  Risk OTC drugs. Prescription drug management. Parenteral controlled substances. Decision regarding hospitalization. Diagnosis or treatment significantly limited by social determinants of health.       Final diagnoses:  Nausea vomiting and diarrhea  Hypertension, unspecified type  Medical non-compliance    ED Discharge Orders          Ordered    hydrALAZINE  (APRESOLINE ) 100 MG tablet  3 times daily        11/16/24 2216    metoCLOPramide  (REGLAN ) 5 MG tablet  Every 6 hours PRN        11/16/24 2216    ondansetron  (ZOFRAN -ODT) 4 MG disintegrating tablet  Every 8 hours PRN        11/16/24 2216               Uzma Hellmer A, PA-C 11/16/24 2320    Rafan Sanders A, PA-C 11/16/24 2322

## 2024-11-16 NOTE — ED Notes (Signed)
 Pt is tolerating PO fluids.

## 2024-11-16 NOTE — ED Triage Notes (Signed)
 Pt c/o NVD and abd pain since this morning; actively vomiting in triage; noncompliant with home medications

## 2024-11-16 NOTE — Discharge Instructions (Addendum)
 It was a pleasure taking care of you here today  Would recommend taking your home medications.  I have refilled some of them.  You need to follow-up with your primary care provider  Elevated blood pressure can cause things like heart attacks, strokes with permanent organ injury.  Return for any worsening symptoms

## 2024-11-17 NOTE — Telephone Encounter (Signed)
 Left message to call  back  Needs Graf f/up from 12/7

## 2024-11-18 ENCOUNTER — Emergency Department (HOSPITAL_BASED_OUTPATIENT_CLINIC_OR_DEPARTMENT_OTHER)

## 2024-11-18 ENCOUNTER — Encounter (HOSPITAL_BASED_OUTPATIENT_CLINIC_OR_DEPARTMENT_OTHER): Payer: Self-pay

## 2024-11-18 ENCOUNTER — Observation Stay (HOSPITAL_BASED_OUTPATIENT_CLINIC_OR_DEPARTMENT_OTHER)
Admission: EM | Admit: 2024-11-18 | Discharge: 2024-11-19 | Disposition: A | Attending: Emergency Medicine | Admitting: Emergency Medicine

## 2024-11-18 ENCOUNTER — Other Ambulatory Visit: Payer: Self-pay

## 2024-11-18 DIAGNOSIS — R112 Nausea with vomiting, unspecified: Secondary | ICD-10-CM | POA: Diagnosis present

## 2024-11-18 DIAGNOSIS — E873 Alkalosis: Secondary | ICD-10-CM | POA: Diagnosis present

## 2024-11-18 DIAGNOSIS — R1111 Vomiting without nausea: Secondary | ICD-10-CM | POA: Diagnosis not present

## 2024-11-18 DIAGNOSIS — R1084 Generalized abdominal pain: Secondary | ICD-10-CM | POA: Diagnosis not present

## 2024-11-18 DIAGNOSIS — N179 Acute kidney failure, unspecified: Secondary | ICD-10-CM

## 2024-11-18 DIAGNOSIS — Z743 Need for continuous supervision: Secondary | ICD-10-CM | POA: Diagnosis not present

## 2024-11-18 DIAGNOSIS — R110 Nausea: Secondary | ICD-10-CM | POA: Diagnosis not present

## 2024-11-18 DIAGNOSIS — I10 Essential (primary) hypertension: Secondary | ICD-10-CM

## 2024-11-18 DIAGNOSIS — R1116 Cannabis hyperemesis syndrome: Principal | ICD-10-CM

## 2024-11-18 LAB — COMPREHENSIVE METABOLIC PANEL WITH GFR
ALT: 33 U/L (ref 0–44)
AST: 92 U/L — ABNORMAL HIGH (ref 15–41)
Albumin: 4.7 g/dL (ref 3.5–5.0)
Alkaline Phosphatase: 118 U/L (ref 38–126)
Anion gap: 15 (ref 5–15)
BUN: 28 mg/dL — ABNORMAL HIGH (ref 6–20)
CO2: 26 mmol/L (ref 22–32)
Calcium: 9.4 mg/dL (ref 8.9–10.3)
Chloride: 100 mmol/L (ref 98–111)
Creatinine, Ser: 3.69 mg/dL — ABNORMAL HIGH (ref 0.61–1.24)
GFR, Estimated: 22 mL/min — ABNORMAL LOW (ref >60)
Glucose, Bld: 60 mg/dL — ABNORMAL LOW (ref 70–99)
Potassium: 3.8 mmol/L (ref 3.5–5.1)
Sodium: 140 mmol/L (ref 135–145)
Total Bilirubin: 1.1 mg/dL (ref 0.0–1.2)
Total Protein: 8.2 g/dL — ABNORMAL HIGH (ref 6.5–8.1)

## 2024-11-18 LAB — MRSA NEXT GEN BY PCR, NASAL: MRSA by PCR Next Gen: NOT DETECTED

## 2024-11-18 LAB — URINE DRUG SCREEN
Amphetamines: NEGATIVE
Barbiturates: NEGATIVE
Benzodiazepines: NEGATIVE
Cocaine: NEGATIVE
Fentanyl: NEGATIVE
Methadone Scn, Ur: NEGATIVE
Opiates: NEGATIVE
Tetrahydrocannabinol: POSITIVE — AB

## 2024-11-18 LAB — I-STAT VENOUS BLOOD GAS, ED
Acid-Base Excess: 7 mmol/L — ABNORMAL HIGH (ref 0.0–2.0)
Bicarbonate: 27.7 mmol/L (ref 20.0–28.0)
Calcium, Ion: 0.91 mmol/L — ABNORMAL LOW (ref 1.15–1.40)
HCT: 43 % (ref 39.0–52.0)
Hemoglobin: 14.6 g/dL (ref 13.0–17.0)
O2 Saturation: 98 %
Patient temperature: 98.7
Potassium: 6.4 mmol/L (ref 3.5–5.1)
Sodium: 134 mmol/L — ABNORMAL LOW (ref 135–145)
TCO2: 29 mmol/L (ref 22–32)
pCO2, Ven: 27.8 mmHg — ABNORMAL LOW (ref 44–60)
pH, Ven: 7.607 (ref 7.25–7.43)
pO2, Ven: 85 mmHg — ABNORMAL HIGH (ref 32–45)

## 2024-11-18 LAB — CBC
HCT: 41.3 % (ref 39.0–52.0)
Hemoglobin: 13.6 g/dL (ref 13.0–17.0)
MCH: 24.6 pg — ABNORMAL LOW (ref 26.0–34.0)
MCHC: 32.9 g/dL (ref 30.0–36.0)
MCV: 74.7 fL — ABNORMAL LOW (ref 80.0–100.0)
Platelets: 187 K/uL (ref 150–400)
RBC: 5.53 MIL/uL (ref 4.22–5.81)
RDW: 15.1 % (ref 11.5–15.5)
WBC: 12.8 K/uL — ABNORMAL HIGH (ref 4.0–10.5)
nRBC: 0 % (ref 0.0–0.2)

## 2024-11-18 LAB — URINALYSIS, ROUTINE W REFLEX MICROSCOPIC
Bilirubin Urine: NEGATIVE
Glucose, UA: 250 mg/dL — AB
Ketones, ur: NEGATIVE mg/dL
Leukocytes,Ua: NEGATIVE
Nitrite: NEGATIVE
Protein, ur: >=300 mg/dL — AB
Specific Gravity, Urine: 1.02 (ref 1.005–1.030)
pH: 7.5 (ref 5.0–8.0)

## 2024-11-18 LAB — CBG MONITORING, ED
Glucose-Capillary: 84 mg/dL (ref 70–99)
Glucose-Capillary: 262 mg/dL — ABNORMAL HIGH (ref 70–99)
Glucose-Capillary: 56 mg/dL — ABNORMAL LOW (ref 70–99)

## 2024-11-18 LAB — URINALYSIS, MICROSCOPIC (REFLEX): Squamous Epithelial / HPF: NONE SEEN /HPF (ref 0–5)

## 2024-11-18 LAB — BETA-HYDROXYBUTYRIC ACID: Beta-Hydroxybutyric Acid: 0.61 mmol/L — ABNORMAL HIGH (ref 0.05–0.27)

## 2024-11-18 LAB — LIPASE, BLOOD: Lipase: 42 U/L (ref 11–51)

## 2024-11-18 LAB — GLUCOSE, CAPILLARY
Glucose-Capillary: 150 mg/dL — ABNORMAL HIGH (ref 70–99)
Glucose-Capillary: 189 mg/dL — ABNORMAL HIGH (ref 70–99)

## 2024-11-18 MED ORDER — AMLODIPINE BESYLATE 10 MG PO TABS
10.0000 mg | ORAL_TABLET | Freq: Every day | ORAL | Status: DC
  Administered 2024-11-19: 10 mg via ORAL
  Filled 2024-11-18: qty 1

## 2024-11-18 MED ORDER — METOCLOPRAMIDE HCL 5 MG/ML IJ SOLN: 10.0000 mg | Freq: Four times a day (QID) | INTRAMUSCULAR | Status: DC | PRN

## 2024-11-18 MED ORDER — FENTANYL CITRATE (PF) 50 MCG/ML IJ SOSY
50.0000 ug | PREFILLED_SYRINGE | Freq: Once | INTRAMUSCULAR | Status: AC
  Administered 2024-11-18: 50 ug via INTRAVENOUS
  Filled 2024-11-18: qty 1

## 2024-11-18 MED ORDER — HEPARIN SODIUM (PORCINE) 5000 UNIT/ML IJ SOLN
5000.0000 [IU] | Freq: Three times a day (TID) | INTRAMUSCULAR | Status: DC
  Administered 2024-11-18 – 2024-11-19 (×2): 5000 [IU] via SUBCUTANEOUS
  Filled 2024-11-18 (×2): qty 1

## 2024-11-18 MED ORDER — METOPROLOL TARTRATE 5 MG/5ML IV SOLN
10.0000 mg | Freq: Once | INTRAVENOUS | Status: AC
  Administered 2024-11-18: 10 mg via INTRAVENOUS
  Filled 2024-11-18: qty 10

## 2024-11-18 MED ORDER — CARVEDILOL 12.5 MG PO TABS
25.0000 mg | ORAL_TABLET | Freq: Two times a day (BID) | ORAL | Status: DC
  Administered 2024-11-19: 25 mg via ORAL
  Filled 2024-11-18: qty 2

## 2024-11-18 MED ORDER — ACETAMINOPHEN 325 MG PO TABS: 650.0000 mg | ORAL_TABLET | Freq: Four times a day (QID) | ORAL | Status: DC | PRN

## 2024-11-18 MED ORDER — METOCLOPRAMIDE HCL 5 MG/ML IJ SOLN
5.0000 mg | Freq: Once | INTRAMUSCULAR | Status: AC
  Administered 2024-11-18: 5 mg via INTRAVENOUS
  Filled 2024-11-18: qty 2

## 2024-11-18 MED ORDER — ONDANSETRON HCL 4 MG PO TABS: 4.0000 mg | ORAL_TABLET | Freq: Four times a day (QID) | ORAL | Status: DC | PRN

## 2024-11-18 MED ORDER — LACTATED RINGERS IV SOLN: INTRAVENOUS | Status: DC

## 2024-11-18 MED ORDER — LACTATED RINGERS IV BOLUS
1000.0000 mL | Freq: Once | INTRAVENOUS | Status: AC
  Administered 2024-11-18: 1000 mL via INTRAVENOUS

## 2024-11-18 MED ORDER — NICARDIPINE HCL IN NACL 20-0.86 MG/200ML-% IV SOLN: 3.0000 mg/h | INTRAVENOUS | Status: DC

## 2024-11-18 MED ORDER — ACETAMINOPHEN 650 MG RE SUPP: 650.0000 mg | Freq: Four times a day (QID) | RECTAL | Status: DC | PRN

## 2024-11-18 MED ORDER — DEXTROSE 50 % IV SOLN
1.0000 | Freq: Once | INTRAVENOUS | Status: AC
  Administered 2024-11-18: 50 mL via INTRAVENOUS

## 2024-11-18 MED ORDER — HYDRALAZINE HCL 20 MG/ML IJ SOLN: 5.0000 mg | Freq: Four times a day (QID) | INTRAMUSCULAR | Status: DC | PRN

## 2024-11-18 MED ORDER — ALBUTEROL SULFATE (2.5 MG/3ML) 0.083% IN NEBU: 2.5000 mg | INHALATION_SOLUTION | RESPIRATORY_TRACT | Status: DC | PRN

## 2024-11-18 MED ORDER — SODIUM CHLORIDE 0.9 % IV BOLUS
1000.0000 mL | Freq: Once | INTRAVENOUS | Status: AC
  Administered 2024-11-18: 1000 mL via INTRAVENOUS

## 2024-11-18 MED ORDER — DEXTROSE IN LACTATED RINGERS 5 % IV SOLN: INTRAVENOUS | Status: DC

## 2024-11-18 MED ORDER — CARVEDILOL 12.5 MG PO TABS
25.0000 mg | ORAL_TABLET | Freq: Once | ORAL | Status: AC
  Administered 2024-11-18: 25 mg via ORAL
  Filled 2024-11-18: qty 2

## 2024-11-18 MED ORDER — ONDANSETRON HCL 4 MG/2ML IJ SOLN: 4.0000 mg | Freq: Four times a day (QID) | INTRAMUSCULAR | Status: DC | PRN

## 2024-11-18 MED ORDER — HYDRALAZINE HCL 50 MG PO TABS
100.0000 mg | ORAL_TABLET | Freq: Three times a day (TID) | ORAL | Status: DC
  Administered 2024-11-18 – 2024-11-19 (×2): 100 mg via ORAL
  Filled 2024-11-18 (×2): qty 2

## 2024-11-18 MED ORDER — METOPROLOL TARTRATE 5 MG/5ML IV SOLN
5.0000 mg | Freq: Once | INTRAVENOUS | Status: AC
  Administered 2024-11-18: 5 mg via INTRAVENOUS
  Filled 2024-11-18: qty 5

## 2024-11-18 MED ORDER — DROPERIDOL 2.5 MG/ML IJ SOLN
1.2500 mg | Freq: Once | INTRAMUSCULAR | Status: AC
  Administered 2024-11-18: 1.25 mg via INTRAVENOUS
  Filled 2024-11-18: qty 2

## 2024-11-18 MED ORDER — AMLODIPINE BESYLATE 5 MG PO TABS
10.0000 mg | ORAL_TABLET | Freq: Once | ORAL | Status: AC
  Administered 2024-11-18: 10 mg via ORAL
  Filled 2024-11-18: qty 2

## 2024-11-18 MED ORDER — DEXTROSE 50 % IV SOLN
INTRAVENOUS | Status: AC
  Filled 2024-11-18: qty 50

## 2024-11-18 NOTE — ED Notes (Signed)
 Panic VBG results reported to A. Franaszek @ 11:38.  Waiting on CMP for K results.

## 2024-11-18 NOTE — ED Provider Notes (Cosign Needed Addendum)
 [facility] Provider Note   CSN: [id number] Arrival date & time: 11/18/24  1032     Patient presents with: Abdominal Pain   Anthony Graham is a 28 y.o. male with history of type 1 diabetes on insulin , marijuana use, hypertension, diabetes, gastroparesis, CKD, presents with concern for nausea and vomiting that started 2 days ago.  He also reports diffuse abdominal pain associated with the nausea and vomiting.  He was seen for the same concern in the emergency room 2 days ago, and was given droperidol  and fluids with improvement in symptoms.  His CT scan of the abdomen at that time did not show any acute abnormalities.  Patient reports normal bowel movements, no constipation or diarrhea.  He does report marijuana use, most recently yesterday, which seemed to make his nausea and vomiting worse.  Denies any chest pain or shortness of breath.  He tried using Zofran  at home without relief of his symptoms.    Abdominal Pain      Prior to Admission medications   Medication Sig Start Date End Date Taking? Authorizing Provider  amLODipine  (NORVASC ) 10 MG tablet Take 1 tablet (10 mg total) by mouth daily. 12/10/21   Gonfa, Taye T, MD  carvedilol  (COREG ) 12.5 MG tablet Take 2 tablets (25 mg total) by mouth 2 (two) times daily with a meal. 08/28/24 09/27/24  Pokhrel, Vernal, MD  Continuous Glucose Sensor (DEXCOM G6 SENSOR) MISC 1 each by Other route as directed. Change every 10 days. 02/28/24   [provider]  Continuous Glucose Transmitter (DEXCOM G6 TRANSMITTER) MISC 1 each by Other route as directed. 11/14/23   [provider]  ergocalciferol (VITAMIN D2) 1.25 MG (50000 UT) capsule Take 50,000 Units by mouth every Wednesday.    [provider]  Glucagon (BAQSIMI ONE PACK) 3 MG/DOSE POWD Place 1 spray into the nose as needed (for low BGL). 02/07/21   [provider]  GVOKE HYPOPEN 2-PACK 1 MG/0.2ML SOAJ Inject 1 Dose  into the muscle Once PRN (hypoglycemia / low blood sugar). 06/11/24   [provider]  hydrALAZINE  (APRESOLINE ) 100 MG tablet Take 1 tablet (100 mg total) by mouth 3 (three) times daily. 11/16/24 12/16/24  Henderly, Britni A, PA-C  insulin  aspart (NOVOLOG ) 100 UNIT/ML FlexPen Inject 130 Units into the skin every 3 (three) days. 01/30/17   [provider]  insulin  degludec (TRESIBA) 100 UNIT/ML FlexTouch Pen Inject 26 Units into the skin daily. Use if insulin  pump malfunctions. 05/23/22   [provider]  Insulin  Disposable Pump (OMNIPOD 5 G6 PODS, GEN 5,) MISC Inject 1 Device into the skin every 3 (three) days.    [provider]  metoCLOPramide  (REGLAN ) 5 MG tablet Take 1 tablet (5 mg total) by mouth every 6 (six) hours as needed for nausea or vomiting. 11/16/24 12/16/24  Henderly, Britni A, PA-C  ondansetron  (ZOFRAN -ODT) 4 MG disintegrating tablet Take 1 tablet (4 mg total) by mouth every 8 (eight) hours as needed for nausea or vomiting. 11/16/24   Henderly, Britni A, PA-C  pantoprazole  (PROTONIX ) 40 MG tablet Take 1 tablet (40 mg total) by mouth daily. Patient taking differently: Take 40 mg by mouth as needed (heartburn). 04/30/22   Regalado, Belkys A, MD  torsemide (DEMADEX) 10 MG tablet Take 10 mg by mouth as needed (swelling). 12/27/22   [provider]  potassium chloride  SA (KLOR-CON ) 20 MEQ tablet Take 1 tablet (20 mEq total) by mouth daily. 03/02/20 08/05/20  Petrucelli,  Samantha R, PA-C    Allergies: Nsaids and Banana    Review of Systems  Gastrointestinal:  Positive for abdominal pain.    Updated Vital Signs BP (!) 131/95   Pulse 91   Temp 98.7 F (37.1 C) (Oral)   Resp 14   SpO2 98%   Physical Exam Vitals and nursing note reviewed.  Constitutional:      General: He is not in acute distress.    Appearance: He is well-developed.     Comments: Appears uncomfortable, having a couple episodes of emesis during my initial evaluation.  Emesis is  nonbloody.  HENT:     Head: Normocephalic and atraumatic.     Mouth/Throat:     Mouth: Mucous membranes are dry.  Eyes:     Conjunctiva/sclera: Conjunctivae normal.  Cardiovascular:     Rate and Rhythm: Regular rhythm. Tachycardia present.     Heart sounds: No murmur heard. Pulmonary:     Effort: Pulmonary effort is normal. No respiratory distress.     Breath sounds: Normal breath sounds.  Abdominal:     General: Bowel sounds are normal.     Palpations: Abdomen is soft.     Tenderness: There is abdominal tenderness.     Comments: Diffuse tenderness to palpation, no rebound or guarding  Musculoskeletal:        General: No swelling.     Cervical back: Neck supple.  Skin:    General: Skin is warm and dry.     Capillary Refill: Capillary refill takes more than 3 seconds.  Neurological:     Mental Status: He is alert.  Psychiatric:        Mood and Affect: Mood normal.     (all labs ordered are listed, but only abnormal results are displayed) Labs Reviewed  CBC - Abnormal; Notable for the following components:      Result Value   WBC 12.8 (*)    MCV 74.7 (*)    MCH 24.6 (*)    All other components within normal limits  URINALYSIS, ROUTINE W REFLEX MICROSCOPIC - Abnormal; Notable for the following components:   Glucose, UA 250 (*)    Hgb urine dipstick LARGE (*)    Protein, ur >=300 (*)    All other components within normal limits  COMPREHENSIVE METABOLIC PANEL WITH GFR - Abnormal; Notable for the following components:   Glucose, Bld 60 (*)    BUN 28 (*)    Creatinine, Ser 3.69 (*)    Total Protein 8.2 (*)    AST 92 (*)    GFR, Estimated 22 (*)    All other components within normal limits  URINE DRUG SCREEN - Abnormal; Notable for the following components:   Tetrahydrocannabinol POSITIVE (*)    All other components within normal limits  URINALYSIS, MICROSCOPIC (REFLEX) - Abnormal; Notable for the following components:   Bacteria, UA RARE (*)    All other components  within normal limits  I-STAT VENOUS BLOOD GAS, ED - Abnormal; Notable for the following components:   pH, Ven 7.607 (*)    pCO2, Ven 27.8 (*)    pO2, Ven 85 (*)    Acid-Base Excess 7.0 (*)    Sodium 134 (*)    Potassium 6.4 (*)    Calcium, Ion 0.91 (*)    All other components within normal limits  CBG MONITORING, ED - Abnormal; Notable for the following components:   Glucose-Capillary 56 (*)    All other components within normal limits  CBG MONITORING, ED - Abnormal; Notable for the following components:   Glucose-Capillary 262 (*)    All other components within normal limits  LIPASE, BLOOD  BETA-HYDROXYBUTYRIC ACID  CBG MONITORING, ED    EKG: EKG Interpretation Date/Time:  Tuesday November 18 2024 11:34:17 EST Ventricular Rate:  98 PR Interval:  101 QRS Duration:  80 QT Interval:  340 QTC Calculation: 435 R Axis:   40  Text Interpretation: Sinus rhythm Short PR interval LAE, consider biatrial enlargement RSR' in V1 or V2, probably normal variant Confirmed by Yolande Charleston [phone]) on 11/18/2024 11:39:39 AM  Radiology: [facility] Chest 2 View Result Date: 11/18/2024 CLINICAL DATA:  Abdominal pain, nausea and vomiting for 2 days EXAM: CHEST - 2 VIEW COMPARISON:  01/09/2024 FINDINGS: The heart size and mediastinal contours are within normal limits. Both lungs are clear. The visualized skeletal structures are unremarkable. IMPRESSION: No active cardiopulmonary disease. Electronically Signed   By: Ozell Daring M.D.   On: 11/18/2024 16:18   CT CHEST ABDOMEN PELVIS WO CONTRAST Result Date: 11/16/2024 EXAM: CT CHEST, ABDOMEN AND PELVIS WITHOUT CONTRAST 11/16/2024 06:55:50 PM TECHNIQUE: CT of the chest, abdomen and pelvis was performed without the administration of intravenous contrast. Multiplanar reformatted images are provided for review. Automated exposure control, iterative reconstruction, and/or weight based adjustment of the mA/kV was utilized to reduce the radiation dose to as low as  reasonably achievable. COMPARISON: CT chest and pelvis from 04/26/2024. CLINICAL HISTORY: cp, abd pain FINDINGS: CHEST: MEDIASTINUM AND LYMPH NODES: Heart and pericardium are unremarkable. The central airways are clear. No gross hilar adenopathy with limited evaluation on this non-contrast study. No mediastinal or axillary lymphadenopathy. LUNGS AND PLEURA: No focal consolidation or pulmonary edema. No pleural effusion or pneumothorax. ABDOMEN AND PELVIS: LIVER: The liver is unremarkable. GALLBLADDER AND BILE DUCTS: Gallbladder is unremarkable. No biliary ductal dilatation. SPLEEN: No acute abnormality. PANCREAS: No acute abnormality. ADRENAL GLANDS: No acute abnormality. KIDNEYS, URETERS AND BLADDER: No stones in the kidneys or ureters. No hydronephrosis. No perinephric or periureteral stranding. Questioned urinary bladder dome wall thickening (5.67 mm). Finding may be due to underdistention of the urinary bladder dome. GI AND BOWEL: Stomach demonstrates no acute abnormality. No small or large bowel thickening or dilatation. The appendix is unremarkable. REPRODUCTIVE ORGANS: The prostate is unremarkable. PERITONEUM AND RETROPERITONEUM: No ascites. No free air. VASCULATURE: Aorta is normal in caliber. Atherosclerotic plaque of the sma. ABDOMINAL AND PELVIS LYMPH NODES: No lymphadenopathy. BONES AND SOFT TISSUES: Slightly heterogeneous bones with no focal osseous lesion. No suspicious lytic or blastic osseous lesion. No focal soft tissue abnormality. IMPRESSION: 1. No acute abnormality of the chest, abdomen, and pelvis. Limited evaluation due to non-contrast technique. 2. Questioned urinary bladder dome wall thickening, possibly due to underdistention. Recommend correlation with urinalysis. Electronically signed by: Morgane Naveau MD 11/16/2024 07:14 PM EST RP Workstation: [id number]     Procedures   Medications Ordered in the ED  dextrose  50 % solution ( Intravenous Not Given 11/18/24 1321)  lactated ringers   bolus 1,000 mL (0 mLs Intravenous Stopped 11/18/24 1255)  fentaNYL  (SUBLIMAZE ) injection 50 mcg (50 mcg Intravenous Given 11/18/24 1130)  metoprolol  tartrate (LOPRESSOR ) injection 5 mg (5 mg Intravenous Given 11/18/24 1131)  droperidol  (INAPSINE ) 2.5 MG/ML injection 1.25 mg (1.25 mg Intravenous Given 11/18/24 1217)  lactated ringers  bolus 1,000 mL (0 mLs Intravenous Stopped 11/18/24 1420)  dextrose  50 % solution 50 mL (50 mLs Intravenous Given 11/18/24 1235)  metoCLOPramide  (REGLAN ) injection 5 mg (5 mg Intravenous Given 11/18/24 1315)  sodium chloride  0.9 % bolus 1,000 mL (1,000 mLs Intravenous New Bag/Given 11/18/24 1445)  carvedilol  (COREG ) tablet 25 mg (25 mg Oral Given 11/18/24 1443)  amLODipine  (NORVASC ) tablet 10 mg (10 mg Oral Given 11/18/24 1442)  metoprolol  tartrate (LOPRESSOR ) injection 10 mg (10 mg Intravenous Given 11/18/24 1540)    Clinical Course as of 11/18/24 1631  Tue Nov 18, 2024  1141 QTc, 435, will give droperidol  for vomiting as I suspect cannabinoid hyperemesis [AF]  1159 Patient with some active vomiting upon arrival. [AF]  1235 CBG rechecked, now 56, will give 1 amp of D50. [AF]  1418 Upon reevaluation, patient reports ongoing abdominal pain.  Has remained tachycardic, although improved from about 118 to 104 with fluids.  Has not had any further episodes of emesis but does want to eat anything  [AF]  1520 Consult with hospitalist Dr. Sebastian who recommends adding on a chest x-ray to rule out aspiration pneumonia and working on blood pressure control  [AF]  1527 Will add on additional metoprolol  for blood pressure control, will aim for systolic 150 for 79% reduction from systolic of 188 upon arrival  [AF]  1555 Blood pressure improving to 149/107 with medications given [AF]    Clinical Course User Index [AF] Veta Palma, PA-C                                 Medical Decision Making Amount and/or Complexity of Data Reviewed Labs: ordered. Radiology:  ordered.  Risk Prescription drug management. Decision regarding hospitalization.     Differential diagnosis includes but is not limited to Cannabinoid hyperemesis syndrome, acute cholecystitis, cholelithiasis, cholangitis, choledocholithiasis, peptic ulcer, gastritis, gastroenteritis, appendicitis, IBS, IBD, DKA, nephrolithiasis, UTI, pyelonephritis, pancreatitis, diverticulitis, mesenteric ischemia, abdominal aortic aneurysm, small bowel obstruction, volvulus   ED Course:  Upon initial evaluation, patient is uncomfortable appearing, but no acute distress.  Having some episodes of emesis.  He is tachycardic to 118.  Suspect this is likely due to some dehydration from the vomiting as he does look dry on exam.  Cap refill is over 3 seconds.  EKG without any QT prolongation, will give droperidol  for nausea as there is high clinical concern for the nausea and vomiting being secondary to marijuana use.  Labs Ordered: I Ordered, and personally interpreted labs.  The pertinent results include:   CBC with leukocytosis of 12.8 CMP with elevated creatinine at 3.69, up from baseline 2.66.  AST elevated at 92.  No other abnormalities in LFTs.  Normal T. bili.  No anion gap Lipase within normal limits CBG with respiratory alkalosis, pH 7.6 with PCO2 low at 27.8 Hypoglycemia upon arrival at 60, improved to 262 D50 Urinalysis with proteinuria, no nitrates leukocytes. UDS positive for [facility]  Imaging Studies ordered: I ordered imaging studies including chest x-ray  I independently visualized the imaging with scope of interpretation limited to determining acute life threatening conditions related to emergency care. Imaging showed no acute abnormalities I agree with the radiologist interpretation   Cardiac Monitoring: / EKG: The patient was maintained on a cardiac monitor.  I personally viewed and interpreted the cardiac monitored which showed an underlying rhythm of: Sinus tachycardia. Normal QTC  435   Consultations Obtained: I requested consultation with the hospitalist Dr. Sebastian,  and discussed lab and imaging findings as well as pertinent plan - they recommend: admission. He requests that blood pressure be better controlled before admission. He also requests chest x-ray  be obtained to rule out aspiration pneumonia given persistent vomiting  Medications Given: 2L LR NS maintenance fluids Droperidol  Reglan  D50 Fentanyl  Metoprolol  25mg  Coreg  10mg  amlodipine   Upon re-evaluation, patient's tachycardia has resolved with fluids and metoprolol , heart rate now in the 90's.  Blood pressure improved to 147/107 down from 188/142 upon arrival with home carvedilol , amlodipine , and the IV metoprolol  given here.  No more persistent vomiting after getting nausea medications, but patient still does not feel up to eating/drinking.  Suspect his nausea vomiting today secondary to cannabinoid hyperemesis syndrome.  UDS positive for THC.  He reports last marijuana use was yesterday and symptoms worsened after marijuana use.  Does have  long history of marijuana use and similar episodes of nausea and vomiting.  No evidence of DKA on labs. Low clinical concern for SBO or ileus given he still reports normal bowel movements and passing gas, no previous abdominal surgeries.   CT abdomen pelvis performed 2 days ago was without acute abnormality, no indication for repeat CT imaging at this time.  Very low clinical concern for PE given no chest pain or shortness of breath. Tachycardia explained by his dehydration from vomiting, tachycardia resolved with fluids. Urinalysis without infection, doubt UTI or pyelonephritis. Chest x-ray unremarkable, no evidence of aspiration pneumonia or other acute process.  Impression: Cannabinoid hyperemesis syndrome Hypertension AKI Respiratory alkalosis  Disposition:  Admission with hospitalist Dr. Sebastian    Record Review: External records from outside source  obtained and reviewed including admission in September 08/2024 for intractable nausea and vomiting thought to be secondary to gastroparesis and marijuana use ER note, labs, and CT imaging from 2 days ago     This chart was dictated using voice recognition software, Dragon. Despite the best efforts of this provider to proofread and correct errors, errors may still occur which can change documentation meaning.       Final diagnoses:  Hypertension, unspecified type  AKI (acute kidney injury)  Cannabinoid hyperemesis syndrome  Respiratory alkalosis    ED Discharge Orders     None          Veta Palma, PA-C 11/18/24 1630    Veta Palma, [place] 11/18/24 1631

## 2024-11-18 NOTE — Care Plan (Signed)
 Plan of Care Note for accepted transfer   Patient: Joshue Badal MRN: [id number]   DOA: 11/18/2024  Facility requesting transfer: [facility] Requesting Provider: Dr. Carmine Harari Reason for transfer: Intractable nausea and vomiting Facility course: Patient 28 year old gentleman history of type 1 diabetes on insulin , chronic marijuana use, hypertension, diabetes, gastroparesis, CKD stage IIIb presented to the ED with a 2-day history of intractable nausea and vomiting.  Patient also with associated abdominal pain with nausea and vomiting.  Patient seen in the ED 2 days prior to admission similar symptoms, given IV fluids and droperidol  with clinical improvement and discharged home.  CT abdomen and pelvis done at that time with no acute abnormalities.  Patient presenting back to the ED noted per ED PA last marijuana use the night prior to admission with worsening nausea and vomiting.  Patient noted to be dehydrated and in AKI on CKD 3B.  EDP.  Patient given IV fluids, IV Reglan , droperidol  and fentanyl .  Patient also noted to have a hypertensive urgency with blood pressure initially as high as 212/137 with repeat blood pressure 199/133. I asked EDP to check a chest x-ray as well as gave some IV medications for hypertensive urgency.  Plan of care: The patient is accepted for admission to [facility] unit, at [facility]    Author: Toribio Hummer, MD 11/18/2024  Check [email or web address] for on-call coverage.  Nursing staff, Please call [facility] Admits [facility] Consults System-Wide number on Amion as soon as patient's arrival, so appropriate admitting provider can evaluate the pt.

## 2024-11-18 NOTE — ED Notes (Signed)
 Called [facility] for Transfer to [facility] @16 :09.  Spoke with Luke

## 2024-11-18 NOTE — H&P (Signed)
 History and Physical  Anthony Graham FMW:[id number] DOB: Dec 28, 1995 DOA: 11/18/2024  PCP: Artis Roderick Kay, PA-C   Chief Complaint: Intractable nausea and vomiting  HPI: Anthony Graham is a 28 y.o. male with medical history significant for gastroparesis, type 1 diabetes, diabetic retinopathy, hypertension, CKD stage IIIb being admitted to the hospital with intractable nausea and vomiting.  History is provided by the patient as well as his mother who is at the bedside.  She states that he is supposed to be on Reglan  for his gastroparesis and does not take it.  He also has continued to smoke marijuana chronically.  Denies any fevers, chest pain, hematemesis, or blood in his stools.  While in the emergency department awaiting transfer to [facility], he did have an episode of hypoglycemia.  States that he has not been able to keep down any p.o. including liquids since 12/7.  Review of Systems: Please see HPI for pertinent positives and negatives. A complete 10 system review of systems are otherwise negative.  Past Medical History:  Diagnosis Date   Diabetic retinopathy (HCC)    DM (diabetes mellitus) (HCC)    Gastroparesis    HTN (hypertension)    Past Surgical History:  Procedure Laterality Date   ESOPHAGOGASTRODUODENOSCOPY     EYE SURGERY     Social History:  reports that he has never smoked. He has never been exposed to tobacco smoke. He has never used smokeless tobacco. He reports that he does not currently use alcohol. He reports current drug use. Drug: Marijuana.  Allergies  Allergen Reactions   Nsaids Other (See Comments)    Contraindication due to to CKD   Banana Itching    Family History  Problem Relation Age of Onset   Diabetes Mother    Diabetes Other      Prior to Admission medications   Medication Sig Start Date End Date Taking? Authorizing Provider  amLODipine  (NORVASC ) 10 MG tablet Take 1 tablet (10 mg total) by mouth daily. 12/10/21   Gonfa, Taye T, MD  carvedilol   (COREG ) 12.5 MG tablet Take 2 tablets (25 mg total) by mouth 2 (two) times daily with a meal. 08/28/24 09/27/24  Pokhrel, Vernal, MD  Continuous Glucose Sensor (DEXCOM G6 SENSOR) MISC 1 each by Other route as directed. Change every 10 days. 02/28/24   [provider]  Continuous Glucose Transmitter (DEXCOM G6 TRANSMITTER) MISC 1 each by Other route as directed. 11/14/23   [provider]  ergocalciferol (VITAMIN D2) 1.25 MG (50000 UT) capsule Take 50,000 Units by mouth every Wednesday.    [provider]  Glucagon (BAQSIMI ONE PACK) 3 MG/DOSE POWD Place 1 spray into the nose as needed (for low BGL). 02/07/21   [provider]  GVOKE HYPOPEN 2-PACK 1 MG/0.2ML SOAJ Inject 1 Dose into the muscle Once PRN (hypoglycemia / low blood sugar). 06/11/24   [provider]  hydrALAZINE  (APRESOLINE ) 100 MG tablet Take 1 tablet (100 mg total) by mouth 3 (three) times daily. 11/16/24 12/16/24  Henderly, Britni A, PA-C  insulin  aspart (NOVOLOG ) 100 UNIT/ML FlexPen Inject 130 Units into the skin every 3 (three) days. 01/30/17   [provider]  insulin  degludec (TRESIBA) 100 UNIT/ML FlexTouch Pen Inject 26 Units into the skin daily. Use if insulin  pump malfunctions. 05/23/22   [provider]  Insulin  Disposable Pump (OMNIPOD 5 G6 PODS, GEN 5,) MISC Inject 1 Device into the skin every 3 (three) days.    [provider]  metoCLOPramide  (REGLAN ) 5 MG  tablet Take 1 tablet (5 mg total) by mouth every 6 (six) hours as needed for nausea or vomiting. 11/16/24 12/16/24  Henderly, Britni A, PA-C  ondansetron  (ZOFRAN -ODT) 4 MG disintegrating tablet Take 1 tablet (4 mg total) by mouth every 8 (eight) hours as needed for nausea or vomiting. 11/16/24   Henderly, Britni A, PA-C  pantoprazole  (PROTONIX ) 40 MG tablet Take 1 tablet (40 mg total) by mouth daily. Patient taking differently: Take 40 mg by mouth as needed (heartburn). 04/30/22   Regalado, Belkys A, MD  torsemide  (DEMADEX) 10 MG tablet Take 10 mg by mouth as needed (swelling). 12/27/22   [provider]  potassium chloride  SA (KLOR-CON ) 20 MEQ tablet Take 1 tablet (20 mEq total) by mouth daily. 03/02/20 08/05/20  Petrucelli, Lucie SAUNDERS, PA-C    Physical Exam: BP (!) 160/111 (BP Location: Right Arm)   Pulse 90   Temp 98.5 F (36.9 C) (Oral)   Resp 16   Ht 5' 6 (1.676 m)   Wt 58.5 kg   SpO2 99%   BMI 20.82 kg/m  General:  Alert, oriented, calm, in no acute distress, mother at the bedside Eyes: EOMI, clear conjuctivae, white sclerea Neck: supple, no masses, trachea mildline  Cardiovascular: RRR, no murmurs or rubs, no peripheral edema  Respiratory: clear to auscultation bilaterally, no wheezes, no crackles  Abdomen: soft, nontender, nondistended, normal bowel tones heard  Skin: dry, no rashes  Musculoskeletal: no joint effusions, normal range of motion  Psychiatric: appropriate affect, normal speech  Neurologic: extraocular muscles intact, clear speech, moving all extremities with intact sensorium         Labs on Admission:  Basic Metabolic Panel: Recent Labs  Lab 11/16/24 1857 11/16/24 1918 11/18/24 1133 11/18/24 1230  NA 144 143 134* 140  K 4.3 4.1 6.4* 3.8  CL 106  --   --  100  CO2 23  --   --  26  GLUCOSE 212*  --   --  60*  BUN 21*  --   --  28*  CREATININE 3.26*  --   --  3.69*  CALCIUM 9.7  --   --  9.4  MG 2.0  --   --   --    Liver Function Tests: Recent Labs  Lab 11/16/24 1857 11/18/24 1230  AST 32 92*  ALT 22 33  ALKPHOS 115 118  BILITOT 0.8 1.1  PROT 7.3 8.2*  ALBUMIN 4.4 4.7   Recent Labs  Lab 11/16/24 1857 11/18/24 1230  LIPASE 28 42   No results for input(s): AMMONIA in the last 168 hours. CBC: Recent Labs  Lab 11/16/24 1857 11/16/24 1918 11/18/24 1126 11/18/24 1133  WBC 9.7  --  12.8*  --   NEUTROABS 8.6*  --   --   --   HGB 11.9* 14.6 13.6 14.6  HCT 37.6* 43.0 41.3 43.0  MCV 76.9*  --  74.7*  --   PLT 182  --  187  --     Cardiac Enzymes: No results for input(s): CKTOTAL, CKMB, CKMBINDEX, TROPONINI in the last 168 hours. BNP (last 3 results) No results for input(s): BNP in the last 8760 hours.  ProBNP (last 3 results) No results for input(s): PROBNP in the last 8760 hours.  CBG: Recent Labs  Lab 11/16/24 1810 11/18/24 1137 11/18/24 1233 11/18/24 1243  GLUCAP 202* 84 56* 262*    Radiological Exams on Admission: DG Chest 2 View Result Date: 11/18/2024 CLINICAL DATA:  Abdominal pain,  nausea and vomiting for 2 days EXAM: CHEST - 2 VIEW COMPARISON:  01/09/2024 FINDINGS: The heart size and mediastinal contours are within normal limits. Both lungs are clear. The visualized skeletal structures are unremarkable. IMPRESSION: No active cardiopulmonary disease. Electronically Signed   By: Ozell Daring M.D.   On: 11/18/2024 16:18   CT CHEST ABDOMEN PELVIS WO CONTRAST Result Date: 11/16/2024 EXAM: CT CHEST, ABDOMEN AND PELVIS WITHOUT CONTRAST 11/16/2024 06:55:50 PM TECHNIQUE: CT of the chest, abdomen and pelvis was performed without the administration of intravenous contrast. Multiplanar reformatted images are provided for review. Automated exposure control, iterative reconstruction, and/or weight based adjustment of the mA/kV was utilized to reduce the radiation dose to as low as reasonably achievable. COMPARISON: CT chest and pelvis from 04/26/2024. CLINICAL HISTORY: cp, abd pain FINDINGS: CHEST: MEDIASTINUM AND LYMPH NODES: Heart and pericardium are unremarkable. The central airways are clear. No gross hilar adenopathy with limited evaluation on this non-contrast study. No mediastinal or axillary lymphadenopathy. LUNGS AND PLEURA: No focal consolidation or pulmonary edema. No pleural effusion or pneumothorax. ABDOMEN AND PELVIS: LIVER: The liver is unremarkable. GALLBLADDER AND BILE DUCTS: Gallbladder is unremarkable. No biliary ductal dilatation. SPLEEN: No acute abnormality. PANCREAS: No acute  abnormality. ADRENAL GLANDS: No acute abnormality. KIDNEYS, URETERS AND BLADDER: No stones in the kidneys or ureters. No hydronephrosis. No perinephric or periureteral stranding. Questioned urinary bladder dome wall thickening (5.67 mm). Finding may be due to underdistention of the urinary bladder dome. GI AND BOWEL: Stomach demonstrates no acute abnormality. No small or large bowel thickening or dilatation. The appendix is unremarkable. REPRODUCTIVE ORGANS: The prostate is unremarkable. PERITONEUM AND RETROPERITONEUM: No ascites. No free air. VASCULATURE: Aorta is normal in caliber. Atherosclerotic plaque of the sma. ABDOMINAL AND PELVIS LYMPH NODES: No lymphadenopathy. BONES AND SOFT TISSUES: Slightly heterogeneous bones with no focal osseous lesion. No suspicious lytic or blastic osseous lesion. No focal soft tissue abnormality. IMPRESSION: 1. No acute abnormality of the chest, abdomen, and pelvis. Limited evaluation due to non-contrast technique. 2. Questioned urinary bladder dome wall thickening, possibly due to underdistention. Recommend correlation with urinalysis. Electronically signed by: Morgane Naveau MD 11/16/2024 07:14 PM EST RP Workstation: [id number]   Assessment/Plan Shirley Bolle is a 28 y.o. male with medical history significant for gastroparesis, type 1 diabetes, diabetic retinopathy, hypertension, CKD stage IIIb being admitted to the hospital with intractable nausea and vomiting.   Intractable nausea and vomiting-in the setting of ongoing chronic marijuana abuse, as well as gastroparesis.  Patient does not take his prescribed Reglan .  He has been having normal bowel movements, no acute intra-abdominal pathology on CT. -Observation admission -IV fluids -Pain and nausea medication as needed -Diet as tolerated  Hypertensive urgency-patient presented to the ER with severely elevated blood pressures, these have significantly improved.  No evidence of acute endorgan dysfunction. -Continue  home amlodipine , hydralazine  and Coreg   Type 1 diabetes-patient to continue his insulin  pump in the hospital, per protocol. He did have an episode of hypoglycemia likely due to poor p.o. intake in the setting of his nausea and vomiting. -Insulin  pump per protocol -Carb modified diet when able to tolerate p.o. intake -Continue dextrose  containing IV fluids in the meantime  Acute kidney injury superimposed on CKD stage IIIb-from dehydration related to his nausea and vomiting, and reduced p.o. intake. -Avoid nephrotoxins and hydrate with IV fluids as above -Monitor renal function with daily labs  DVT prophylaxis: HepSQ     Code Status: Full Code  Consults called: None  Admission status:  Observation  Time spent: 53 minutes  Rishit Burkhalter CHRISTELLA Gail MD [facility] Pager [phone]  If 7PM-7AM, please contact night-coverage [email or web address] Password [email or web address]  11/18/2024, 5:35 PM

## 2024-11-18 NOTE — Progress Notes (Signed)
 Insulin  Pump Therapy contract signed by patient and placed in chart.

## 2024-11-18 NOTE — ED Triage Notes (Signed)
 Generalized abd pain, NV for 2 days.   Seen in ED 2 days ago Hx of gastroparesis

## 2024-11-18 NOTE — ED Notes (Signed)
 ED Provider at bedside.

## 2024-11-19 ENCOUNTER — Other Ambulatory Visit (HOSPITAL_COMMUNITY): Payer: Self-pay

## 2024-11-19 DIAGNOSIS — R112 Nausea with vomiting, unspecified: Secondary | ICD-10-CM | POA: Diagnosis not present

## 2024-11-19 LAB — BASIC METABOLIC PANEL WITH GFR
Anion gap: 10 (ref 5–15)
BUN: 29 mg/dL — ABNORMAL HIGH (ref 6–20)
CO2: 25 mmol/L (ref 22–32)
Calcium: 8.2 mg/dL — ABNORMAL LOW (ref 8.9–10.3)
Chloride: 101 mmol/L (ref 98–111)
Creatinine, Ser: 3.72 mg/dL — ABNORMAL HIGH (ref 0.61–1.24)
GFR, Estimated: 22 mL/min — ABNORMAL LOW (ref >60)
Glucose, Bld: 235 mg/dL — ABNORMAL HIGH (ref 70–99)
Potassium: 3.9 mmol/L (ref 3.5–5.1)
Sodium: 136 mmol/L (ref 135–145)

## 2024-11-19 LAB — GLUCOSE, CAPILLARY: Glucose-Capillary: 159 mg/dL — ABNORMAL HIGH (ref 70–99)

## 2024-11-19 LAB — CBC
HCT: 37.5 % — ABNORMAL LOW (ref 39.0–52.0)
Hemoglobin: 12 g/dL — ABNORMAL LOW (ref 13.0–17.0)
MCH: 24.2 pg — ABNORMAL LOW (ref 26.0–34.0)
MCHC: 32 g/dL (ref 30.0–36.0)
MCV: 75.6 fL — ABNORMAL LOW (ref 80.0–100.0)
Platelets: 142 K/uL — ABNORMAL LOW (ref 150–400)
RBC: 4.96 MIL/uL (ref 4.22–5.81)
RDW: 14.6 % (ref 11.5–15.5)
WBC: 8.7 K/uL (ref 4.0–10.5)
nRBC: 0 % (ref 0.0–0.2)

## 2024-11-19 MED ORDER — AMLODIPINE BESYLATE 10 MG PO TABS
10.0000 mg | ORAL_TABLET | Freq: Every day | ORAL | 0 refills | Status: AC
  Filled 2024-11-19: qty 30, 30d supply, fill #0

## 2024-11-19 MED ORDER — PANTOPRAZOLE SODIUM 40 MG PO TBEC
40.0000 mg | DELAYED_RELEASE_TABLET | Freq: Every day | ORAL | 0 refills | Status: AC
  Filled 2024-11-19: qty 30, 30d supply, fill #0

## 2024-11-19 MED ORDER — METOCLOPRAMIDE HCL 5 MG PO TABS
5.0000 mg | ORAL_TABLET | Freq: Four times a day (QID) | ORAL | 0 refills | Status: AC | PRN
  Filled 2024-11-19: qty 30, 8d supply, fill #0

## 2024-11-19 MED ORDER — CARVEDILOL 25 MG PO TABS
25.0000 mg | ORAL_TABLET | Freq: Two times a day (BID) | ORAL | 0 refills | Status: AC
  Filled 2024-11-19: qty 60, 30d supply, fill #0

## 2024-11-19 NOTE — Discharge Summary (Signed)
 Physician Discharge Summary  Anthony Graham FMW:[id number] DOB: 10-03-1996 DOA: 11/18/2024  PCP: Artis Roderick Kay, PA-C  Admit date: 11/18/2024 Discharge date: 11/19/2024  Admitted From: Home Disposition: Home  Recommendations for Outpatient Follow-up:  Follow up with PCP in 1 week with repeat CBC/BMP Follow up in ED if symptoms worsen or new appear   Home Health: No Equipment/Devices: None  Discharge Condition: Stable CODE STATUS: Full Diet recommendation: Carb modified  Brief/Interim Summary: 28 y.o. male with medical history significant for gastroparesis, type 1 diabetes, diabetic retinopathy, hypertension, CKD stage IV presented with intractable nausea and vomiting.  On presentation, he had an episode of hypoglycemia.  His blood pressure was extremely high.  He was treated with IV fluids and antiemetics.  Urine drug screen was positive for tetrahydrocannabinol.  Subsequently, his condition has improved.  He is tolerating diet and wants to go home today.  He will be discharged home today with close outpatient follow-up with PCP.  Recommend outpatient evaluation and follow-up with nephrology as well.  Discharge Diagnoses:   Intractable nausea and vomiting - Possibly due to combination of diabetic gastroparesis, chronic marijuana use. - No acute abnormality on CT of chest, abdomen and pelvis -He was treated with IV fluids and antiemetics.   -Subsequently, his condition has improved.  He is tolerating diet and wants to go home today.  He will be discharged home today with close outpatient follow-up with PCP.   Hypertensive urgency -Blood pressure elevated but improving.  Continue amlodipine , Coreg  and hydralazine .  Outpatient follow-up with PCP  AKI on CKD stage IV - Possibly from dehydration - Creatinine still slightly elevated than baseline but patient is tolerating diet and wants to go home today.  Recommend outpatient evaluation and follow-up with nephrology.  Outpatient  follow-up of BMP  Diabetes mellitus type 1 with hypoglycemia - Uses insulin  pump at home.  Continue insulin  pump on discharge.  Carb modified diet  Chronic marijuana use - Urine drug screen positive for tetrahydrocannabinol.  Counseled regarding cessation.  Discharge Instructions  Discharge Instructions     Diet - low sodium heart healthy   Complete by: As directed    Diet Carb Modified   Complete by: As directed    Increase activity slowly   Complete by: As directed       Allergies as of 11/19/2024       Reactions   Nsaids Other (See Comments)   Contraindication due to to CKD   Banana Itching        Medication List     STOP taking these medications    insulin  aspart 100 UNIT/ML FlexPen Commonly known as: NOVOLOG        TAKE these medications    amLODipine  10 MG tablet Commonly known as: NORVASC  Take 1 tablet (10 mg total) by mouth daily.   Baqsimi One Pack 3 MG/DOSE Powd Generic drug: Glucagon Place 1 spray into the nose as needed (for low BGL).   Gvoke HypoPen 2-Pack 1 MG/0.2ML Soaj Generic drug: Glucagon Inject 1 Dose into the muscle Once PRN (hypoglycemia / low blood sugar).   carvedilol  25 MG tablet Commonly known as: Coreg  Take 1 tablet (25 mg total) by mouth 2 (two) times daily.   Dexcom G6 Sensor Misc 1 each by Other route as directed. Change every 10 days.   Dexcom G6 Transmitter Misc 1 each by Other route as directed.   ergocalciferol 1.25 MG (50000 UT) capsule Commonly known as: VITAMIN D2 Take 50,000 Units by mouth every Wednesday.  hydrALAZINE  100 MG tablet Commonly known as: APRESOLINE  Take 1 tablet (100 mg total) by mouth 3 (three) times daily.   insulin  degludec 100 UNIT/ML FlexTouch Pen Commonly known as: TRESIBA Inject 26 Units into the skin daily. Use if insulin  pump malfunctions.   metoCLOPramide  5 MG tablet Commonly known as: Reglan  Take 1 tablet (5 mg total) by mouth every 6 (six) hours as needed for nausea or  vomiting.   Omnipod 5 DexG7G6 Pods Gen 5 Misc Inject 1 Device into the skin every 3 (three) days.   ondansetron  4 MG disintegrating tablet Commonly known as: ZOFRAN -ODT Take 1 tablet (4 mg total) by mouth every 8 (eight) hours as needed for nausea or vomiting.   pantoprazole  40 MG tablet Commonly known as: PROTONIX  Take 1 tablet (40 mg total) by mouth daily.   torsemide 10 MG tablet Commonly known as: DEMADEX Take 10 mg by mouth as needed (swelling).        Follow-up Information     Artis Roderick Kay, PA-C. Schedule an appointment as soon as possible for a visit in 1 week(s).   Specialty: Internal Medicine Why: with repeat bmp Contact information: [address] [place] [address] [phone]         Pa, [employer]. Schedule an appointment as soon as possible for a visit in 1 week(s).   Contact information: [address] [place] [address] [phone]                Allergies  Allergen Reactions   Nsaids Other (See Comments)    Contraindication due to to CKD   Banana Itching    Consultations: None   Procedures/Studies: DG Chest 2 View Result Date: 11/18/2024 CLINICAL DATA:  Abdominal pain, nausea and vomiting for 2 days EXAM: CHEST - 2 VIEW COMPARISON:  01/09/2024 FINDINGS: The heart size and mediastinal contours are within normal limits. Both lungs are clear. The visualized skeletal structures are unremarkable. IMPRESSION: No active cardiopulmonary disease. Electronically Signed   By: Ozell Daring M.D.   On: 11/18/2024 16:18   CT CHEST ABDOMEN PELVIS WO CONTRAST Result Date: 11/16/2024 EXAM: CT CHEST, ABDOMEN AND PELVIS WITHOUT CONTRAST 11/16/2024 06:55:50 PM TECHNIQUE: CT of the chest, abdomen and pelvis was performed without the administration of intravenous contrast. Multiplanar reformatted images are provided for review. Automated exposure control, iterative reconstruction, and/or weight based adjustment  of the mA/kV was utilized to reduce the radiation dose to as low as reasonably achievable. COMPARISON: CT chest and pelvis from 04/26/2024. CLINICAL HISTORY: cp, abd pain FINDINGS: CHEST: MEDIASTINUM AND LYMPH NODES: Heart and pericardium are unremarkable. The central airways are clear. No gross hilar adenopathy with limited evaluation on this non-contrast study. No mediastinal or axillary lymphadenopathy. LUNGS AND PLEURA: No focal consolidation or pulmonary edema. No pleural effusion or pneumothorax. ABDOMEN AND PELVIS: LIVER: The liver is unremarkable. GALLBLADDER AND BILE DUCTS: Gallbladder is unremarkable. No biliary ductal dilatation. SPLEEN: No acute abnormality. PANCREAS: No acute abnormality. ADRENAL GLANDS: No acute abnormality. KIDNEYS, URETERS AND BLADDER: No stones in the kidneys or ureters. No hydronephrosis. No perinephric or periureteral stranding. Questioned urinary bladder dome wall thickening (5.67 mm). Finding may be due to underdistention of the urinary bladder dome. GI AND BOWEL: Stomach demonstrates no acute abnormality. No small or large bowel thickening or dilatation. The appendix is unremarkable. REPRODUCTIVE ORGANS: The prostate is unremarkable. PERITONEUM AND RETROPERITONEUM: No ascites. No free air. VASCULATURE: Aorta is normal in caliber. Atherosclerotic plaque of the sma. ABDOMINAL  AND PELVIS LYMPH NODES: No lymphadenopathy. BONES AND SOFT TISSUES: Slightly heterogeneous bones with no focal osseous lesion. No suspicious lytic or blastic osseous lesion. No focal soft tissue abnormality. IMPRESSION: 1. No acute abnormality of the chest, abdomen, and pelvis. Limited evaluation due to non-contrast technique. 2. Questioned urinary bladder dome wall thickening, possibly due to underdistention. Recommend correlation with urinalysis. Electronically signed by: Morgane Naveau MD 11/16/2024 07:14 PM EST RP Workstation: [id number]      Subjective: Patient seen and examined at bedside.  No  fever, chest pain, shortness of breath reported.  No vomiting since admission.  Tolerating diet.  Wants to go home today.  Discharge Exam: Vitals:   11/19/24 0900 11/19/24 1000  BP: 130/79 (!) 91/50  Pulse: 89 83  Resp: 17 12  Temp:    SpO2: 99% 98%    General: Pt is alert, awake, not in acute distress.  Slow to respond.  Flat affect.   Cardiovascular: rate controlled, S1/S2 + Respiratory: bilateral decreased breath sounds at bases Abdominal: Soft, NT, ND, bowel sounds + Extremities: no edema, no cyanosis    The results of significant diagnostics from this hospitalization (including imaging, microbiology, ancillary and laboratory) are listed below for reference.     Microbiology: Recent Results (from the past 240 hours)  MRSA Next Gen by PCR, Nasal     Status: None   Collection Time: 11/18/24  5:35 PM   Specimen: Nasal Mucosa; Nasal Swab  Result Value Ref Range Status   MRSA by PCR Next Gen NOT DETECTED NOT DETECTED Final    Comment: (NOTE) The GeneXpert MRSA Assay (FDA approved for NASAL specimens only), is one component of a comprehensive MRSA colonization surveillance program. It is not intended to diagnose MRSA infection nor to guide or monitor treatment for MRSA infections. Test performance is not FDA approved in patients less than 63 years old. Performed at [facility], [address] [place], [place] [address]      Labs: BNP (last 3 results) No results for input(s): BNP in the last 8760 hours. Basic Metabolic Panel: Recent Labs  Lab 11/16/24 1857 11/16/24 1918 11/18/24 1133 11/18/24 1230 11/19/24 0314  NA 144 143 134* 140 136  K 4.3 4.1 6.4* 3.8 3.9  CL 106  --   --  100 101  CO2 23  --   --  26 25  GLUCOSE 212*  --   --  60* 235*  BUN 21*  --   --  28* 29*  CREATININE 3.26*  --   --  3.69* 3.72*  CALCIUM 9.7  --   --  9.4 8.2*  MG 2.0  --   --   --   --    Liver Function Tests: Recent Labs  Lab 11/16/24 1857  11/18/24 1230  AST 32 92*  ALT 22 33  ALKPHOS 115 118  BILITOT 0.8 1.1  PROT 7.3 8.2*  ALBUMIN 4.4 4.7   Recent Labs  Lab 11/16/24 1857 11/18/24 1230  LIPASE 28 42   No results for input(s): AMMONIA in the last 168 hours. CBC: Recent Labs  Lab 11/16/24 1857 11/16/24 1918 11/18/24 1126 11/18/24 1133 11/19/24 0314  WBC 9.7  --  12.8*  --  8.7  NEUTROABS 8.6*  --   --   --   --   HGB 11.9* 14.6 13.6 14.6 12.0*  HCT 37.6* 43.0 41.3 43.0 37.5*  MCV 76.9*  --  74.7*  --  75.6*  PLT 182  --  187  --  142*   Cardiac Enzymes: No results for input(s): CKTOTAL, CKMB, CKMBINDEX, TROPONINI in the last 168 hours. BNP: Invalid input(s): POCBNP CBG: Recent Labs  Lab 11/18/24 1233 11/18/24 1243 11/18/24 1743 11/18/24 2205 11/19/24 0750  GLUCAP 56* 262* 150* 189* 159*   D-Dimer No results for input(s): DDIMER in the last 72 hours. Hgb A1c No results for input(s): HGBA1C in the last 72 hours. Lipid Profile No results for input(s): CHOL, HDL, LDLCALC, TRIG, CHOLHDL, LDLDIRECT in the last 72 hours. Thyroid function studies No results for input(s): TSH, T4TOTAL, T3FREE, THYROIDAB in the last 72 hours.  Invalid input(s): FREET3 Anemia work up No results for input(s): VITAMINB12, FOLATE, FERRITIN, TIBC, IRON, RETICCTPCT in the last 72 hours. Urinalysis    Component Value Date/Time   COLORURINE YELLOW 11/18/2024 1109   APPEARANCEUR CLEAR 11/18/2024 1109   LABSPEC 1.020 11/18/2024 1109   PHURINE 7.5 11/18/2024 1109   GLUCOSEU 250 (A) 11/18/2024 1109   HGBUR LARGE (A) 11/18/2024 1109   BILIRUBINUR NEGATIVE 11/18/2024 1109   KETONESUR NEGATIVE 11/18/2024 1109   PROTEINUR >=300 (A) 11/18/2024 1109   NITRITE NEGATIVE 11/18/2024 1109   LEUKOCYTESUR NEGATIVE 11/18/2024 1109   Sepsis Labs Recent Labs  Lab 11/16/24 1857 11/18/24 1126 11/19/24 0314  WBC 9.7 12.8* 8.7   Microbiology Recent Results (from the past 240 hours)   MRSA Next Gen by PCR, Nasal     Status: None   Collection Time: 11/18/24  5:35 PM   Specimen: Nasal Mucosa; Nasal Swab  Result Value Ref Range Status   MRSA by PCR Next Gen NOT DETECTED NOT DETECTED Final    Comment: (NOTE) The GeneXpert MRSA Assay (FDA approved for NASAL specimens only), is one component of a comprehensive MRSA colonization surveillance program. It is not intended to diagnose MRSA infection nor to guide or monitor treatment for MRSA infections. Test performance is not FDA approved in patients less than 59 years old. Performed at [facility], [address] [place], [place] [address]      Time coordinating discharge: 35 minutes  SIGNED:   Sophie Mao, MD  [facility] 11/19/2024, 10:41 AM

## 2024-11-19 NOTE — Plan of Care (Signed)
  Problem: Clinical Measurements: Goal: Respiratory complications will improve Outcome: Progressing Goal: Cardiovascular complication will be avoided Outcome: Progressing   Problem: Nutrition: Goal: Adequate nutrition will be maintained Outcome: Progressing   Problem: Elimination: Goal: Will not experience complications related to urinary retention Outcome: Progressing   Problem: Pain Managment: Goal: General experience of comfort will improve and/or be controlled Outcome: Progressing

## 2024-11-19 NOTE — Progress Notes (Signed)
°   11/19/24 1053  TOC Brief Assessment  Insurance and Status Reviewed  Patient has primary care physician Yes  Home environment has been reviewed Apartment  Prior level of function: Independent wth ADL's  Prior/Current Home Services No current home services  Social Drivers of Health Review SDOH reviewed no interventions necessary  Readmission risk has been reviewed Yes  Transition of care needs no transition of care needs at this time

## 2024-11-19 NOTE — Plan of Care (Signed)
 Patient educated on discharge instructions by this RN. Patient's PIVs removed. Patient belongings returned to patient. Patient brought in wheelchair down to main entrance.   Problem: Education: Goal: Knowledge of General Education information will improve Description: Including pain rating scale, medication(s)/side effects and non-pharmacologic comfort measures Outcome: Completed/Met   Problem: Health Behavior/Discharge Planning: Goal: Ability to manage health-related needs will improve Outcome: Completed/Met   Problem: Clinical Measurements: Goal: Ability to maintain clinical measurements within normal limits will improve Outcome: Completed/Met Goal: Will remain free from infection Outcome: Completed/Met Goal: Diagnostic test results will improve Outcome: Completed/Met Goal: Respiratory complications will improve Outcome: Completed/Met Goal: Cardiovascular complication will be avoided Outcome: Completed/Met   Problem: Activity: Goal: Risk for activity intolerance will decrease Outcome: Completed/Met   Problem: Nutrition: Goal: Adequate nutrition will be maintained Outcome: Completed/Met   Problem: Coping: Goal: Level of anxiety will decrease Outcome: Completed/Met   Problem: Elimination: Goal: Will not experience complications related to bowel motility Outcome: Completed/Met Goal: Will not experience complications related to urinary retention Outcome: Completed/Met   Problem: Pain Managment: Goal: General experience of comfort will improve and/or be controlled Outcome: Completed/Met   Problem: Safety: Goal: Ability to remain free from injury will improve Outcome: Completed/Met   Problem: Skin Integrity: Goal: Risk for impaired skin integrity will decrease Outcome: Completed/Met

## 2024-11-19 NOTE — Progress Notes (Signed)
 Discharge meds in a secure bag delivered to patient in room by this RN
